# Patient Record
Sex: Female | Born: 1990 | Race: White | Hispanic: No | Marital: Married | Smoking: Current every day smoker
Health system: Southern US, Community
[De-identification: ages and names within clinical notes are randomized; demographics above are authoritative.]

## PROBLEM LIST (undated history)

## (undated) DIAGNOSIS — R87619 Unspecified abnormal cytological findings in specimens from cervix uteri: Secondary | ICD-10-CM

## (undated) DIAGNOSIS — F329 Major depressive disorder, single episode, unspecified: Secondary | ICD-10-CM

## (undated) DIAGNOSIS — F429 Obsessive-compulsive disorder, unspecified: Secondary | ICD-10-CM

## (undated) DIAGNOSIS — J45909 Unspecified asthma, uncomplicated: Secondary | ICD-10-CM

## (undated) DIAGNOSIS — R87629 Unspecified abnormal cytological findings in specimens from vagina: Secondary | ICD-10-CM

## (undated) DIAGNOSIS — K589 Irritable bowel syndrome without diarrhea: Secondary | ICD-10-CM

## (undated) DIAGNOSIS — B009 Herpesviral infection, unspecified: Secondary | ICD-10-CM

## (undated) DIAGNOSIS — N3010 Interstitial cystitis (chronic) without hematuria: Secondary | ICD-10-CM

## (undated) DIAGNOSIS — J069 Acute upper respiratory infection, unspecified: Secondary | ICD-10-CM

## (undated) DIAGNOSIS — K219 Gastro-esophageal reflux disease without esophagitis: Secondary | ICD-10-CM

## (undated) DIAGNOSIS — Z3490 Encounter for supervision of normal pregnancy, unspecified, unspecified trimester: Secondary | ICD-10-CM

## (undated) DIAGNOSIS — IMO0001 Reserved for inherently not codable concepts without codable children: Secondary | ICD-10-CM

## (undated) DIAGNOSIS — G2581 Restless legs syndrome: Secondary | ICD-10-CM

## (undated) DIAGNOSIS — F419 Anxiety disorder, unspecified: Secondary | ICD-10-CM

## (undated) DIAGNOSIS — IMO0002 Reserved for concepts with insufficient information to code with codable children: Secondary | ICD-10-CM

## (undated) DIAGNOSIS — Z8619 Personal history of other infectious and parasitic diseases: Secondary | ICD-10-CM

## (undated) HISTORY — DX: Restless legs syndrome: G25.81

## (undated) HISTORY — DX: Anxiety disorder, unspecified: F41.9

## (undated) HISTORY — DX: Major depressive disorder, single episode, unspecified: F32.9

## (undated) HISTORY — DX: Personal history of other infectious and parasitic diseases: Z86.19

## (undated) HISTORY — DX: Unspecified asthma, uncomplicated: J45.909

## (undated) HISTORY — DX: Unspecified abnormal cytological findings in specimens from cervix uteri: R87.619

## (undated) HISTORY — DX: Acute upper respiratory infection, unspecified: J06.9

## (undated) HISTORY — DX: Reserved for inherently not codable concepts without codable children: IMO0001

## (undated) HISTORY — DX: Herpesviral infection, unspecified: B00.9

## (undated) HISTORY — DX: Gastro-esophageal reflux disease without esophagitis: K21.9

## (undated) HISTORY — DX: Interstitial cystitis (chronic) without hematuria: N30.10

## (undated) HISTORY — DX: Reserved for concepts with insufficient information to code with codable children: IMO0002

## (undated) HISTORY — DX: Irritable bowel syndrome, unspecified: K58.9

## (undated) HISTORY — PX: NO PAST SURGERIES: SHX2092

## (undated) HISTORY — DX: Obsessive-compulsive disorder, unspecified: F42.9

## (undated) HISTORY — DX: Unspecified abnormal cytological findings in specimens from vagina: R87.629

---
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
## 2009-04-13 ENCOUNTER — Ambulatory Visit (HOSPITAL_COMMUNITY): Admission: RE | Admit: 2009-04-13 | Discharge: 2009-04-13 | Payer: Self-pay | Admitting: Family Medicine

## 2010-05-22 ENCOUNTER — Ambulatory Visit (HOSPITAL_COMMUNITY)
Admission: RE | Admit: 2010-05-22 | Discharge: 2010-05-22 | Payer: Self-pay | Source: Home / Self Care | Admitting: Family Medicine

## 2010-06-13 ENCOUNTER — Ambulatory Visit: Payer: Self-pay | Admitting: Internal Medicine

## 2010-06-19 ENCOUNTER — Encounter (HOSPITAL_COMMUNITY)
Admission: RE | Admit: 2010-06-19 | Discharge: 2010-07-19 | Payer: Self-pay | Source: Home / Self Care | Attending: Internal Medicine | Admitting: Internal Medicine

## 2010-07-04 ENCOUNTER — Ambulatory Visit
Admission: RE | Admit: 2010-07-04 | Discharge: 2010-07-04 | Payer: Self-pay | Source: Home / Self Care | Attending: Internal Medicine | Admitting: Internal Medicine

## 2010-07-25 ENCOUNTER — Ambulatory Visit (HOSPITAL_COMMUNITY)
Admission: RE | Admit: 2010-07-25 | Discharge: 2010-07-25 | Payer: Self-pay | Source: Home / Self Care | Attending: Internal Medicine | Admitting: Internal Medicine

## 2010-07-25 ENCOUNTER — Ambulatory Visit: Admit: 2010-07-25 | Payer: Self-pay | Admitting: Internal Medicine

## 2010-07-27 ENCOUNTER — Ambulatory Visit (HOSPITAL_COMMUNITY)
Admission: RE | Admit: 2010-07-27 | Discharge: 2010-07-27 | Payer: Self-pay | Source: Home / Self Care | Attending: Internal Medicine | Admitting: Internal Medicine

## 2010-08-04 NOTE — Consult Note (Signed)
NAMEJENNESSA, Tracy Flores               ACCOUNT NO.:  [id number]  MEDICAL RECORD NO.:  [id number]          Flores TYPE:  AMB  LOCATION:  DAY                           FACILITY:  [facility]  PHYSICIAN:  Lionel December, M.D.    DATE OF BIRTH:  [birth year]  DATE OF CONSULTATION:  07/04/2010 DATE OF DISCHARGE:                                CONSULTATION   REASON FOR CONSULT:  Epigastric pain and nausea.  Tracy Flores is a 20 year old female that presents today with complaints of nausea, vomiting and diarrhea.  She states there were chills also associated with her complaints.  She was last seen June 13, 2010, for epigastric pain, cramping and mucus in her stools.  She underwent a HIDA scan which was normal.  She is H. pylori negative.  She has celiac panel May 09, 2010, which was negative.  On May 09, 2010, her basic metabolic panel was normal except for glucose was slightly elevated at 101.  Her liver profile, her total bilirubin was 0.4, direct 0.1, indirect was 0.3, ALP was 70, AST 13, ALT 9, total protein was 7.1. Her albumin was 4.8 and her amylase was 18.  Abdominal ultrasound was negative.  Her common bile duct measured 2.8-mm.  Since her last visit, she has lost approximately 4 pounds.  She complains of upper epigastric pain on a daily basis.  She states when she eats she will have epigastric pain.  She is presently taking clonazepam one half in Tracy morning for her diarrhea which is better.  She is having 2 stools a day at this time.  She states her symptoms are worse since her last visit.  Home medications include: 1. Protonix 40 mg a day. 2. Lomotil p.r.n. 3. Clonazepam one half tab a day.  OBJECTIVE:  VITAL SIGNS:  Temperature is 97.8, blood pressure is 110/70, pulse is 72, her weight is 146.3.  Please note, she is allergic to PENICILLIN intake for Tracy IVs. MOUTH:  She has natural teeth.  Her oral mucosa is moist. EYES:  Her conjunctivae are pink.  Her sclerae are  anicteric. NECK:  Her thyroid is normal.  There is no cervical lymphadenopathy. LUNGS:  Clear. HEART:  Regular rate and rhythm. ABDOMEN:  Soft.  Bowel sounds are present.  She does have epigastric tenderness. EXTREMITIES:  There is no edema to his lower extremities.  ASSESSMENT:  Tracy Flores is a 20 year old female with complaints of Tracy gastric pain, nausea and vomiting.  Her diarrhea has improved. She is having two stools  a day.  She is on Protonix which has not helped.  She is Helicobacter pylori negative.  Peptic ulcer disease needs to be ruled out.  RECOMMENDATIONS:  We will plan for an EGD with Dr. Karilyn Cota in Tracy near future, and I have discussed Tracy risks and benefits with Tracy Flores and her grandmother who was also a Flores of Dr. Karilyn Cota.    ______________________________ Dorene Ar, NP   ______________________________ Lionel December, M.D.    TS/MEDQ  D:  07/04/2010  T:  07/05/2010  Job:  [id number]  Electronically Signed by Dorene Ar PA on 07/24/2010 05:26:16  PM Electronically Signed by Lionel December M.D. on 08/04/2010 01:42:43 PM

## 2010-08-04 NOTE — Op Note (Signed)
  Tracy Flores, SCHONBERGER               ACCOUNT NO.:  [id number]  MEDICAL RECORD NO.:  [id number]          PATIENT TYPE:  AMB  LOCATION:  DAY                           FACILITY:  [facility]  PHYSICIAN:  Lionel December, M.D.    DATE OF BIRTH:  1990/12/10  DATE OF PROCEDURE:  07/25/2010 DATE OF DISCHARGE:                              OPERATIVE REPORT   PROCEDURE:  Esophagogastroduodenoscopy.  INDICATIONS:  [name] is a 20 year old Caucasian female who has had symptoms of GERD for 2 years and maintained on a PPI.  She states her heartburn has generally been well controlled, but for last few weeks she has been experiencing epigastric pain and nausea.  She had negative hepatobiliary ultrasound in November and in December 2011.  She had HIDA scan with [facility] which was within normal limits.  Her EF was 70%.  She also complains of recurrent diarrhea.  She states in a given month she may have 3 weeks of diarrhea and rest of the days stools maybe normal.  She is undergoing diagnostic EGD.  MEDICATIONS FOR CONSCIOUS SEDATION: 1. Cetacaine spray for oropharyngeal topical anesthesia. 2. Demerol 50 mg IV. 3. Versed 13 mg IV.  FINDINGS:  Procedure performed in endoscopy suite.  The patient's vital signs and O2 sats were monitored during the procedure and remained stable.  The patient was placed in left lateral recumbent position and Pentax videoscope was passed through oropharynx without any difficulty into esophagus.  Esophagus, mucosa of the esophagus normal.  GE junction was located at 38-cm from the incisors and with a serrated appearance and there was slight discoloration of the mucosa.  Therefore, biopsy was taken on the way out to rule out short segment Barrett's.  GE junction was located at 38 and hiatus was at 40.  Stomach, it was empty and distended very well with insufflation.  Folds of the proximal stomach are normal.  Examination of mucosa at body, antrum, pyloric channel as well as  angularis, fundus and cardia was normal.  Duodenum, bulbar mucosa was normal.  Scope was passed into the second part of the duodenum where mucosa had some coarse appearance. Therefore, biopsy was taken to rule out mucosal disease.  Endoscope was withdrawn.  The patient tolerated the procedure well.  FINAL DIAGNOSES: 1. Small sliding hiatal hernia and serrated GEJ.    Biopsy was taken to rule out short segment Barrett's. 2. Abnormal appearance to postbulbar mucosa.  It was biopsied to rule     out celiac disease.  RECOMMENDATIONS: 1. She will continue antireflux measures and pantoprazole as before. 2. Dicyclomine 10 mg before breakfast and lunch prescription given for     60 with 3 refills.  We will schedule her for small-bowel follow-     through.  I will be contacting the patient and her mother with     results of pending studies.     Lionel December, M.D.     NR/MEDQ  D:  07/25/2010  T:  07/26/2010  Job:  [id number]  cc:   Lorin Picket A. Gerda Diss, MD Fax: [phone]  Electronically Signed by Lionel December M.D. on 08/04/2010 01:43:36 PM

## 2010-09-12 ENCOUNTER — Emergency Department (HOSPITAL_COMMUNITY)
Admission: EM | Admit: 2010-09-12 | Discharge: 2010-09-13 | Disposition: A | Discharge status: Other Acute Inpatient Hospital | Payer: BC Managed Care – PPO | Attending: Emergency Medicine | Admitting: Emergency Medicine

## 2010-09-12 DIAGNOSIS — F411 Generalized anxiety disorder: Secondary | ICD-10-CM | POA: Insufficient documentation

## 2010-09-12 DIAGNOSIS — R45851 Suicidal ideations: Secondary | ICD-10-CM | POA: Insufficient documentation

## 2010-09-12 DIAGNOSIS — F3289 Other specified depressive episodes: Secondary | ICD-10-CM | POA: Insufficient documentation

## 2010-09-12 DIAGNOSIS — F410 Panic disorder [episodic paroxysmal anxiety] without agoraphobia: Secondary | ICD-10-CM | POA: Insufficient documentation

## 2010-09-12 DIAGNOSIS — F329 Major depressive disorder, single episode, unspecified: Secondary | ICD-10-CM | POA: Insufficient documentation

## 2010-09-12 LAB — POCT PREGNANCY, URINE: Preg Test, Ur: NEGATIVE

## 2010-09-13 LAB — RAPID URINE DRUG SCREEN, HOSP PERFORMED
Amphetamines: NOT DETECTED
Benzodiazepines: NOT DETECTED
Cocaine: NOT DETECTED

## 2010-09-13 LAB — DIFFERENTIAL
Basophils Absolute: 0 10*3/uL (ref 0.0–0.1)
Basophils Relative: 0 % (ref 0–1)
Eosinophils Absolute: 0.2 10*3/uL (ref 0.0–0.7)
Eosinophils Relative: 2 % (ref 0–5)
Lymphocytes Relative: 38 % (ref 12–46)
Lymphs Abs: 3.5 10*3/uL (ref 0.7–4.0)
Monocytes Absolute: 0.8 10*3/uL (ref 0.1–1.0)
Monocytes Relative: 9 % (ref 3–12)
Neutro Abs: 4.7 10*3/uL (ref 1.7–7.7)
Neutrophils Relative %: 52 % (ref 43–77)

## 2010-09-13 LAB — CBC
HCT: 37.2 % (ref 36.0–46.0)
Hemoglobin: 12.8 g/dL (ref 12.0–15.0)
MCH: 29.4 pg (ref 26.0–34.0)
MCHC: 34.4 g/dL (ref 30.0–36.0)
MCV: 85.5 fL (ref 78.0–100.0)
Platelets: 209 10*3/uL (ref 150–400)
RBC: 4.35 MIL/uL (ref 3.87–5.11)
RDW: 12.8 % (ref 11.5–15.5)
WBC: 9.2 10*3/uL (ref 4.0–10.5)

## 2010-09-13 LAB — BASIC METABOLIC PANEL WITH GFR
BUN: 12 mg/dL (ref 6–23)
CO2: 26 meq/L (ref 19–32)
Calcium: 9.2 mg/dL (ref 8.4–10.5)
Chloride: 105 meq/L (ref 96–112)
Creatinine, Ser: 0.77 mg/dL (ref 0.4–1.2)
GFR calc non Af Amer: >60 mL/min
Glucose, Bld: 105 mg/dL — ABNORMAL HIGH (ref 70–99)
Potassium: 3.3 meq/L — ABNORMAL LOW (ref 3.5–5.1)
Sodium: 138 meq/L (ref 135–145)

## 2010-09-13 LAB — ETHANOL: Alcohol, Ethyl (B): <5 mg/dL (ref 0–10)

## 2010-10-29 ENCOUNTER — Emergency Department (HOSPITAL_COMMUNITY)
Admission: EM | Admit: 2010-10-29 | Discharge: 2010-10-30 | Disposition: A | Discharge status: Other Acute Inpatient Hospital | Payer: BC Managed Care – PPO | Attending: Emergency Medicine | Admitting: Emergency Medicine

## 2010-10-29 DIAGNOSIS — F411 Generalized anxiety disorder: Secondary | ICD-10-CM | POA: Insufficient documentation

## 2010-10-29 DIAGNOSIS — F3130 Bipolar disorder, current episode depressed, mild or moderate severity, unspecified: Secondary | ICD-10-CM | POA: Insufficient documentation

## 2010-10-29 DIAGNOSIS — R45851 Suicidal ideations: Secondary | ICD-10-CM | POA: Insufficient documentation

## 2010-10-29 LAB — ACETAMINOPHEN LEVEL: Acetaminophen (Tylenol), Serum: <10 ug/mL — ABNORMAL LOW (ref 10–30)

## 2010-10-29 LAB — BASIC METABOLIC PANEL
BUN: 11 mg/dL (ref 6–23)
Calcium: 9.2 mg/dL (ref 8.4–10.5)
Chloride: 107 mEq/L (ref 96–112)
Creatinine, Ser: 0.91 mg/dL (ref 0.4–1.2)
GFR calc Af Amer: >60 mL/min (ref >60)
GFR calc non Af Amer: >60 mL/min (ref >60)

## 2010-10-29 LAB — URINE MICROSCOPIC-ADD ON

## 2010-10-29 LAB — DIFFERENTIAL
Basophils Absolute: 0 10*3/uL (ref 0.0–0.1)
Basophils Relative: 0 % (ref 0–1)
Eosinophils Absolute: 0.2 10*3/uL (ref 0.0–0.7)
Monocytes Absolute: 0.7 10*3/uL (ref 0.1–1.0)
Monocytes Relative: 8 % (ref 3–12)
Neutro Abs: 3.8 10*3/uL (ref 1.7–7.7)
Neutrophils Relative %: 43 % (ref 43–77)

## 2010-10-29 LAB — URINALYSIS, ROUTINE W REFLEX MICROSCOPIC
Bilirubin Urine: NEGATIVE
Leukocytes, UA: NEGATIVE
Nitrite: NEGATIVE
Specific Gravity, Urine: <1.005 — ABNORMAL LOW (ref 1.005–1.030)
Urobilinogen, UA: 0.2 mg/dL (ref 0.0–1.0)
pH: 6.5 (ref 5.0–8.0)

## 2010-10-29 LAB — PREGNANCY, URINE: Preg Test, Ur: NEGATIVE

## 2010-10-29 LAB — CBC
Hemoglobin: 13.3 g/dL (ref 12.0–15.0)
MCH: 30.1 pg (ref 26.0–34.0)
MCHC: 33.6 g/dL (ref 30.0–36.0)
Platelets: 257 10*3/uL (ref 150–400)

## 2010-10-29 LAB — RAPID URINE DRUG SCREEN, HOSP PERFORMED: Tetrahydrocannabinol: POSITIVE — AB

## 2010-10-29 LAB — ETHANOL: Alcohol, Ethyl (B): <5 mg/dL (ref 0–10)

## 2010-10-29 LAB — SALICYLATE LEVEL: Salicylate Lvl: <4 mg/dL (ref 2.8–20.0)

## 2010-10-30 ENCOUNTER — Inpatient Hospital Stay (HOSPITAL_COMMUNITY)
Admission: AD | Admit: 2010-10-30 | Discharge: 2010-10-31 | DRG: 430 | Disposition: A | Discharge status: Home / Self Care | Payer: BC Managed Care – PPO | Source: Ambulatory Visit | Attending: Psychiatry | Admitting: Psychiatry

## 2010-10-30 DIAGNOSIS — F339 Major depressive disorder, recurrent, unspecified: Secondary | ICD-10-CM

## 2010-10-30 DIAGNOSIS — K589 Irritable bowel syndrome without diarrhea: Secondary | ICD-10-CM

## 2010-10-30 DIAGNOSIS — F332 Major depressive disorder, recurrent severe without psychotic features: Principal | ICD-10-CM

## 2010-10-30 DIAGNOSIS — Z880 Allergy status to penicillin: Secondary | ICD-10-CM

## 2010-10-30 DIAGNOSIS — R45851 Suicidal ideations: Secondary | ICD-10-CM
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
## 2010-11-24 ENCOUNTER — Emergency Department (HOSPITAL_COMMUNITY)
Admission: EM | Admit: 2010-11-24 | Discharge: 2010-11-24 | Disposition: A | Discharge status: Home / Self Care | Payer: BC Managed Care – PPO | Attending: Emergency Medicine | Admitting: Emergency Medicine

## 2010-11-24 ENCOUNTER — Emergency Department (HOSPITAL_COMMUNITY): Payer: BC Managed Care – PPO

## 2010-11-24 DIAGNOSIS — K589 Irritable bowel syndrome without diarrhea: Secondary | ICD-10-CM | POA: Insufficient documentation

## 2010-11-24 DIAGNOSIS — S82899A Other fracture of unspecified lower leg, initial encounter for closed fracture: Secondary | ICD-10-CM | POA: Insufficient documentation

## 2010-11-24 DIAGNOSIS — F319 Bipolar disorder, unspecified: Secondary | ICD-10-CM | POA: Insufficient documentation

## 2010-11-24 DIAGNOSIS — W19XXXA Unspecified fall, initial encounter: Secondary | ICD-10-CM | POA: Insufficient documentation

## 2010-11-24 DIAGNOSIS — Z79899 Other long term (current) drug therapy: Secondary | ICD-10-CM | POA: Insufficient documentation

## 2010-11-24 DIAGNOSIS — F411 Generalized anxiety disorder: Secondary | ICD-10-CM | POA: Insufficient documentation

## 2010-11-27 ENCOUNTER — Ambulatory Visit (INDEPENDENT_AMBULATORY_CARE_PROVIDER_SITE_OTHER): Payer: BC Managed Care – PPO | Admitting: Orthopedic Surgery

## 2010-11-27 ENCOUNTER — Encounter: Payer: Self-pay | Admitting: Orthopedic Surgery

## 2010-11-27 VITALS — HR 70 | Ht 69.0 in | Wt 140.0 lb

## 2010-11-27 DIAGNOSIS — S82899A Other fracture of unspecified lower leg, initial encounter for closed fracture: Secondary | ICD-10-CM | POA: Insufficient documentation

## 2010-11-27 DIAGNOSIS — S8263XA Displaced fracture of lateral malleolus of unspecified fibula, initial encounter for closed fracture: Secondary | ICD-10-CM

## 2010-11-27 MED ORDER — HYDROCODONE-ACETAMINOPHEN 7.5-325 MG PO TABS: 1.0000 | ORAL_TABLET | ORAL | Status: DC | PRN

## 2010-11-27 NOTE — Patient Instructions (Signed)
Elevate, apply ice 6 times a day for 30 minutes.  No weightbearing.  Come back in a week for cast

## 2010-11-27 NOTE — Progress Notes (Signed)
The patient  RIGHT ankle pain.  Date of injury May 25  Fell downstairs  Severe pain, RIGHT ankle, swelling, popping, sharp.  Exam severely swollen foot. Skin neck and mycotic. Could not assess range of motion, stability, or strength. Muscle tone is normal. Ankle is fairly swollen, tender to palpation.  End plates with crutches.  Mood and affect. Normal.  Oriented x3.  General appearance normal.  3. Vital signs obtained. The patient cannot stand for actual weight.  Pulse and temperature normal. Edema from swelling.  Lymph nodes negative.  Sensation normal.  Reflexes deferred.  Coordination and balance normal.  X-rays show nondisplaced Weber B. Fracture, fibula.  Patient to swollen to cast, try again next week after splinting again continue nonweightbearing

## 2010-12-04 ENCOUNTER — Ambulatory Visit (INDEPENDENT_AMBULATORY_CARE_PROVIDER_SITE_OTHER): Payer: BC Managed Care – PPO | Admitting: Orthopedic Surgery

## 2010-12-04 ENCOUNTER — Encounter: Payer: Self-pay | Admitting: Orthopedic Surgery

## 2010-12-04 DIAGNOSIS — S8263XA Displaced fracture of lateral malleolus of unspecified fibula, initial encounter for closed fracture: Secondary | ICD-10-CM

## 2010-12-04 NOTE — Progress Notes (Signed)
RIGHT ankle fracture, lateral malleolus, nondisplaced, postinjury date 10  Application short leg cast.  A large portion. The swelling has gone down. The skin is ecchymotic and discolored. She has pins and needles in the foot as well.  No contraindication to casting. She is placed in a weightbearing cast and advised to come back in 2 weeks for x-rays out of plaster. Possible air cast at that time

## 2010-12-04 NOTE — Patient Instructions (Signed)
Weight bearing as tolerated   Start with crutches then wean off crutches   Keep  Cast dry   Do not get wet   If it gets wet dry with a hair dryer on low setting and call the office

## 2010-12-16 ENCOUNTER — Emergency Department (HOSPITAL_COMMUNITY)
Admission: EM | Admit: 2010-12-16 | Discharge: 2010-12-16 | Disposition: A | Discharge status: Home / Self Care | Payer: BC Managed Care – PPO | Attending: Emergency Medicine | Admitting: Emergency Medicine

## 2010-12-16 DIAGNOSIS — F1910 Other psychoactive substance abuse, uncomplicated: Secondary | ICD-10-CM | POA: Insufficient documentation

## 2010-12-16 DIAGNOSIS — N390 Urinary tract infection, site not specified: Secondary | ICD-10-CM | POA: Insufficient documentation

## 2010-12-16 DIAGNOSIS — F319 Bipolar disorder, unspecified: Secondary | ICD-10-CM | POA: Insufficient documentation

## 2010-12-16 DIAGNOSIS — F17200 Nicotine dependence, unspecified, uncomplicated: Secondary | ICD-10-CM | POA: Insufficient documentation

## 2010-12-16 DIAGNOSIS — K589 Irritable bowel syndrome without diarrhea: Secondary | ICD-10-CM | POA: Insufficient documentation

## 2010-12-16 LAB — URINALYSIS, ROUTINE W REFLEX MICROSCOPIC
Glucose, UA: NEGATIVE mg/dL
Ketones, ur: NEGATIVE mg/dL
Protein, ur: NEGATIVE mg/dL
Urobilinogen, UA: 0.2 mg/dL (ref 0.0–1.0)

## 2010-12-16 LAB — URINE MICROSCOPIC-ADD ON

## 2010-12-18 ENCOUNTER — Ambulatory Visit: Payer: BC Managed Care – PPO | Admitting: Orthopedic Surgery

## 2010-12-19 ENCOUNTER — Ambulatory Visit (INDEPENDENT_AMBULATORY_CARE_PROVIDER_SITE_OTHER): Payer: BC Managed Care – PPO | Admitting: Orthopedic Surgery

## 2010-12-19 ENCOUNTER — Encounter: Payer: Self-pay | Admitting: Orthopedic Surgery

## 2010-12-19 DIAGNOSIS — S82899A Other fracture of unspecified lower leg, initial encounter for closed fracture: Secondary | ICD-10-CM

## 2010-12-19 NOTE — Progress Notes (Signed)
RIGHT ankle fracture, lateral malleolus, nondisplaced,  Date of injury was May 25  Applied. Short leg cast, June 5.  Patient says she took the cast off, approximately 5 days ago.   Scheduled for x-rays today.   Post injury day #26  Clarification. She was walking by a pool slipped and fell onto the water with her cast on, so she took it off. It's been off her for 5 days.  Her exam shows tenderness at the fracture site. Her range of motion at the ankle joint has improved significantly.  X-rays show persistent fracture, so we will put her in an Aircast a repair as tolerated. She can return to work.  Come back a week's x-ray,  Wear  Aircast for ambulation

## 2010-12-19 NOTE — Patient Instructions (Signed)
Wear air cast when walking

## 2011-01-17 ENCOUNTER — Encounter (HOSPITAL_COMMUNITY): Payer: Self-pay | Admitting: Emergency Medicine

## 2011-01-17 ENCOUNTER — Emergency Department (HOSPITAL_COMMUNITY)
Admission: EM | Admit: 2011-01-17 | Discharge: 2011-01-18 | Disposition: A | Discharge status: Other Acute Inpatient Hospital | Payer: BC Managed Care – PPO | Attending: Emergency Medicine | Admitting: Emergency Medicine

## 2011-01-17 DIAGNOSIS — F329 Major depressive disorder, single episode, unspecified: Secondary | ICD-10-CM

## 2011-01-17 DIAGNOSIS — F3289 Other specified depressive episodes: Secondary | ICD-10-CM | POA: Insufficient documentation

## 2011-01-17 DIAGNOSIS — F17200 Nicotine dependence, unspecified, uncomplicated: Secondary | ICD-10-CM | POA: Insufficient documentation

## 2011-01-17 DIAGNOSIS — K589 Irritable bowel syndrome without diarrhea: Secondary | ICD-10-CM | POA: Insufficient documentation

## 2011-01-17 DIAGNOSIS — K219 Gastro-esophageal reflux disease without esophagitis: Secondary | ICD-10-CM | POA: Insufficient documentation

## 2011-01-17 LAB — COMPREHENSIVE METABOLIC PANEL
ALT: 7 U/L (ref 0–35)
AST: 12 U/L (ref 0–37)
Alkaline Phosphatase: 73 U/L (ref 39–117)
CO2: 30 mEq/L (ref 19–32)
Chloride: 104 mEq/L (ref 96–112)
GFR calc non Af Amer: >60 mL/min (ref >60)
Sodium: 140 mEq/L (ref 135–145)
Total Bilirubin: 0.2 mg/dL — ABNORMAL LOW (ref 0.3–1.2)

## 2011-01-17 LAB — CBC
Platelets: 221 10*3/uL (ref 150–400)
RBC: 4.41 MIL/uL (ref 3.87–5.11)
WBC: 9.3 10*3/uL (ref 4.0–10.5)

## 2011-01-17 LAB — RAPID URINE DRUG SCREEN, HOSP PERFORMED
Barbiturates: NOT DETECTED
Tetrahydrocannabinol: POSITIVE — AB

## 2011-01-17 LAB — POCT PREGNANCY, URINE: Preg Test, Ur: NEGATIVE

## 2011-01-17 NOTE — ED Notes (Signed)
Pt talking on cell phone, becoming upset, cell phone removed, offered pt something to eat, pt refused, pt's grandmother called er and advised that we would need to feed pt, explained to grandmother that pt was refusing to eat but that we would keep offering pt food, sitter at bedside,

## 2011-01-17 NOTE — ED Provider Notes (Signed)
History     Chief Complaint  Patient presents with  . Medical Clearance   Patient is a 20 y.o. female presenting with mental health disorder. The history is provided by the patient. No language interpreter was used.  Mental Health Problem Primary symptoms comment: Depression Episode onset: several days ago. This is a recurrent problem.  Additional symptoms of the illness do not include no headaches or no abdominal pain. Associated symptoms comments: "Racing thoughts.". She admits to suicidal ideas. She does not have a plan to commit suicide. She has not already injured self. She does not contemplate injuring another person. Risk factors that are present for mental illness include a history of mental illness.  Patient c/o severe depression and racing thoughts onset several days ago and worsening since. Reports having suicidal ideations today but has no plan currently. States depression and SI are not relieved or aggravated by anything. Patient was seen at [facility] today and referred to ED for evaluation after expressing suicidal ideations. Reports history of SI but no attempts. Denies etoh/drug use and homicidal ideations.  Patient seen at 8:19PM Past Medical History  Diagnosis Date  . IBS (irritable bowel syndrome)   . Acid reflux disease   . Major depression   . OCD (obsessive compulsive disorder)   . Anxiety   . Interstitial cystitis     History reviewed. No pertinent past surgical history.  Family History  Problem Relation Age of Onset  . Heart disease    . Arthritis    . Cancer    . Diabetes      History  Substance Use Topics  . Smoking status: Current Everyday Smoker -- 1.0 packs/day    Types: Cigarettes  . Smokeless tobacco: Not on file  . Alcohol Use: Yes     very little not often    OB History    Grav Para Term Preterm Abortions TAB SAB Ect Mult Living                  Review of Systems  Gastrointestinal: Negative for abdominal pain.  Neurological: Negative  for headaches.  Psychiatric/Behavioral: Positive for suicidal ideas.       Depression. Racing thoughts.   All other systems reviewed and are negative.  All other systems negative except as noted in HPI.    Physical Exam  BP 120/75  Pulse 54  Temp(Src) 98 F (36.7 C) (Oral)  Resp 20  Ht 5\' 7"  (1.702 m)  Wt 133 lb 6.4 oz (60.51 kg)  BMI 20.89 kg/m2  SpO2 100%  LMP 12/27/2010  Physical Exam  Nursing note and vitals reviewed. Constitutional: She is oriented to person, place, and time. She appears well-developed and well-nourished. No distress.       Bradycardic.   HENT:  Head: Normocephalic and atraumatic.  Eyes: Conjunctivae are normal. No scleral icterus.  Neck: Normal range of motion. Neck supple.  Cardiovascular: Normal rate, regular rhythm and normal pulses.   Pulmonary/Chest: Effort normal.  Abdominal: Soft. Bowel sounds are normal. She exhibits no distension. There is no tenderness.  Musculoskeletal: Normal range of motion. She exhibits no edema.  Lymphadenopathy:    She has no cervical adenopathy.  Neurological: She is alert and oriented to person, place, and time. No sensory deficit.  Skin: Skin is warm and dry.  Psychiatric: She has a normal mood and affect. Her behavior is normal. Thought content normal.    ED Course  Procedures  MDM      Results for  orders placed during the hospital encounter of 01/17/11  CBC      Component Value Range   WBC 9.3  4.0 - 10.5 (K/uL)   RBC 4.41  3.87 - 5.11 (MIL/uL)   Hemoglobin 13.5  12.0 - 15.0 (g/dL)   HCT 30.1  60.1 - 09.3 (%)   MCV 90.9  78.0 - 100.0 (fL)   MCH 30.6  26.0 - 34.0 (pg)   MCHC 33.7  30.0 - 36.0 (g/dL)   RDW 23.5  57.3 - 22.0 (%)   Platelets 221  150 - 400 (K/uL)  COMPREHENSIVE METABOLIC PANEL      Component Value Range   Sodium 140  135 - 145 (mEq/L)   Potassium 3.9  3.5 - 5.1 (mEq/L)   Chloride 104  96 - 112 (mEq/L)   CO2 30  19 - 32 (mEq/L)   Glucose, Bld 93  70 - 99 (mg/dL)   BUN 10  6 - 23  (mg/dL)   Creatinine, Ser 2.54  0.50 - 1.10 (mg/dL)   Calcium 9.3  8.4 - 27.0 (mg/dL)   Total Protein 7.1  6.0 - 8.3 (g/dL)   Albumin 3.8  3.5 - 5.2 (g/dL)   AST 12  0 - 37 (U/L)   ALT 7  0 - 35 (U/L)   Alkaline Phosphatase 73  39 - 117 (U/L)   Total Bilirubin 0.2 (*) 0.3 - 1.2 (mg/dL)   GFR calc non Af Amer >60  >60 (mL/min)   GFR calc Af Amer >60  >60 (mL/min)  ETHANOL      Component Value Range   Alcohol, Ethyl (B) <11  0 - 11 (mg/dL)  DRUG SCREEN PANEL, EMERGENCY      Component Value Range   Opiates NONE DETECTED  NONE DETECTED    Cocaine POSITIVE (*) NONE DETECTED    Benzodiazepines NONE DETECTED  NONE DETECTED    Amphetamines NONE DETECTED  NONE DETECTED    Tetrahydrocannabinol POSITIVE (*) NONE DETECTED    Barbiturates NONE DETECTED  NONE DETECTED   PREGNANCY, URINE      Component Value Range   Preg Test, Ur NEGATIVE    POCT PREGNANCY, URINE      Component Value Range   Preg Test, Ur NEGATIVE       Chart written by Clarita Crane acting as scribe for Nelia Shi, MD  I personally performed the services described in this documentation, which was scribed in my presence. The recorded information has been reviewed and considered.   Nelia Shi, MD 01/27/11 [phone]

## 2011-01-17 NOTE — ED Notes (Signed)
Food and nicotine patch offered to pt,. Pt refusing each, advised to let staff know if she changes her mind

## 2011-01-17 NOTE — ED Notes (Signed)
Samson Frederic, ([facility]) here to evaluate pt

## 2011-01-17 NOTE — ED Notes (Signed)
Pt was seen at [facility] and sent here for involuntary commitment. Pt states she has been si for about 6 months. Pt states she does not have a plan.

## 2011-01-17 NOTE — ED Notes (Signed)
Pt in hallway 4 with sitter. NAD> pt placed in blue scrubs.

## 2011-01-17 NOTE — ED Notes (Addendum)
Pt brought to er from [facility] with ivc paperwork, pt states that she has been depressed, admits to SI, states that she doesn't have a plan, pt states that she has been having racing thoughts and she gets overwhelmed when she has these thoughts, pt cooperative at present, little eye contact, ivc paperwork placed on pt's chart, sitter at bedside, pt in paper scrubs,

## 2011-01-18 ENCOUNTER — Inpatient Hospital Stay (HOSPITAL_COMMUNITY)
Admission: RE | Admit: 2011-01-18 | Discharge: 2011-01-19 | DRG: 430 | Disposition: A | Discharge status: Home / Self Care | Payer: BC Managed Care – PPO | Source: Ambulatory Visit | Attending: Psychiatry | Admitting: Psychiatry

## 2011-01-18 DIAGNOSIS — R45851 Suicidal ideations: Secondary | ICD-10-CM

## 2011-01-18 DIAGNOSIS — IMO0002 Reserved for concepts with insufficient information to code with codable children: Secondary | ICD-10-CM

## 2011-01-18 DIAGNOSIS — F339 Major depressive disorder, recurrent, unspecified: Secondary | ICD-10-CM

## 2011-01-18 DIAGNOSIS — F332 Major depressive disorder, recurrent severe without psychotic features: Principal | ICD-10-CM

## 2011-01-18 DIAGNOSIS — F411 Generalized anxiety disorder: Secondary | ICD-10-CM

## 2011-01-18 DIAGNOSIS — Z818 Family history of other mental and behavioral disorders: Secondary | ICD-10-CM

## 2011-01-18 DIAGNOSIS — F4310 Post-traumatic stress disorder, unspecified: Secondary | ICD-10-CM

## 2011-01-18 MED ORDER — LORAZEPAM 1 MG PO TABS
0.5000 mg | ORAL_TABLET | Freq: Once | ORAL | Status: DC
  Filled 2011-01-18: qty 1

## 2011-01-18 NOTE — ED Notes (Signed)
Patient resting does not need anything at this time.

## 2011-01-18 NOTE — ED Notes (Addendum)
Pt ambulatory to restroom, sitter at bedside, pt has been accepted to [facility] by Dr. Darlina Guys, bed number 503-1

## 2011-01-18 NOTE — Progress Notes (Signed)
Advised by nursing that patient has been accepted at [facility] by Dr. Harvie Heck Readling.

## 2011-01-18 NOTE — ED Notes (Signed)
Report given to Susa Simmonds, RN at [facility], [facility] here to transport pt. Pt belonging given to [employer], belonging bag is tied, stamped with pt name

## 2011-01-18 NOTE — ED Notes (Signed)
Pt refused ativan,
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
## 2011-02-12 ENCOUNTER — Ambulatory Visit: Payer: BC Managed Care – PPO | Admitting: Orthopedic Surgery

## 2011-02-12 ENCOUNTER — Encounter: Payer: Self-pay | Admitting: Orthopedic Surgery

## 2011-03-13 ENCOUNTER — Ambulatory Visit (INDEPENDENT_AMBULATORY_CARE_PROVIDER_SITE_OTHER): Payer: BC Managed Care – PPO | Admitting: Psychiatry

## 2011-03-13 DIAGNOSIS — F332 Major depressive disorder, recurrent severe without psychotic features: Secondary | ICD-10-CM

## 2011-03-13 DIAGNOSIS — F411 Generalized anxiety disorder: Secondary | ICD-10-CM

## 2011-04-22 ENCOUNTER — Encounter (HOSPITAL_COMMUNITY): Payer: Self-pay | Admitting: *Deleted

## 2011-04-22 ENCOUNTER — Emergency Department (HOSPITAL_COMMUNITY)
Admission: EM | Admit: 2011-04-22 | Discharge: 2011-04-22 | Disposition: A | Discharge status: Home / Self Care | Payer: BC Managed Care – PPO | Attending: Emergency Medicine | Admitting: Emergency Medicine

## 2011-04-22 DIAGNOSIS — F329 Major depressive disorder, single episode, unspecified: Secondary | ICD-10-CM | POA: Insufficient documentation

## 2011-04-22 DIAGNOSIS — K219 Gastro-esophageal reflux disease without esophagitis: Secondary | ICD-10-CM | POA: Insufficient documentation

## 2011-04-22 DIAGNOSIS — R109 Unspecified abdominal pain: Secondary | ICD-10-CM | POA: Insufficient documentation

## 2011-04-22 DIAGNOSIS — F3289 Other specified depressive episodes: Secondary | ICD-10-CM | POA: Insufficient documentation

## 2011-04-22 DIAGNOSIS — R197 Diarrhea, unspecified: Secondary | ICD-10-CM | POA: Insufficient documentation

## 2011-04-22 DIAGNOSIS — R110 Nausea: Secondary | ICD-10-CM | POA: Insufficient documentation

## 2011-04-22 DIAGNOSIS — R6883 Chills (without fever): Secondary | ICD-10-CM | POA: Insufficient documentation

## 2011-04-22 DIAGNOSIS — N898 Other specified noninflammatory disorders of vagina: Secondary | ICD-10-CM | POA: Insufficient documentation

## 2011-04-22 DIAGNOSIS — K589 Irritable bowel syndrome without diarrhea: Secondary | ICD-10-CM | POA: Insufficient documentation

## 2011-04-22 LAB — URINALYSIS, ROUTINE W REFLEX MICROSCOPIC
Glucose, UA: NEGATIVE mg/dL
Ketones, ur: NEGATIVE mg/dL
Leukocytes, UA: NEGATIVE
Nitrite: NEGATIVE
Protein, ur: NEGATIVE mg/dL

## 2011-04-22 LAB — BASIC METABOLIC PANEL
Calcium: 9.2 mg/dL (ref 8.4–10.5)
Chloride: 103 mEq/L (ref 96–112)
Creatinine, Ser: 0.75 mg/dL (ref 0.50–1.10)
GFR calc Af Amer: >90 mL/min (ref >90)
GFR calc non Af Amer: >90 mL/min (ref >90)

## 2011-04-22 LAB — CBC
HCT: 40.6 % (ref 36.0–46.0)
MCHC: 33.7 g/dL (ref 30.0–36.0)
RDW: 13 % (ref 11.5–15.5)

## 2011-04-22 LAB — DIFFERENTIAL
Basophils Absolute: 0 10*3/uL (ref 0.0–0.1)
Basophils Relative: 0 % (ref 0–1)
Eosinophils Absolute: 0.3 10*3/uL (ref 0.0–0.7)
Eosinophils Relative: 3 % (ref 0–5)
Monocytes Absolute: 0.9 10*3/uL (ref 0.1–1.0)
Neutro Abs: 4.8 10*3/uL (ref 1.7–7.7)

## 2011-04-22 MED ORDER — OXYCODONE-ACETAMINOPHEN 5-325 MG PO TABS
1.0000 | ORAL_TABLET | Freq: Once | ORAL | Status: AC
  Administered 2011-04-22: 1 via ORAL
  Filled 2011-04-22: qty 1

## 2011-04-22 MED ORDER — SODIUM CHLORIDE 0.9 % IV BOLUS (SEPSIS)
500.0000 mL | Freq: Once | INTRAVENOUS | Status: AC
  Administered 2011-04-22: 500 mL via INTRAVENOUS

## 2011-04-22 MED ORDER — SODIUM CHLORIDE 0.9 % IV BOLUS (SEPSIS)
1000.0000 mL | Freq: Once | INTRAVENOUS | Status: AC
  Administered 2011-04-22: 1000 mL via INTRAVENOUS

## 2011-04-22 NOTE — ED Notes (Signed)
Hx of IBS per pt. Pt states past week her stools have been "green with mucus"

## 2011-04-22 NOTE — ED Provider Notes (Signed)
History     CSN: [id number] Arrival date & time: 04/22/2011  7:30 PM   First MD Initiated Contact with Patient 04/22/11 2015    Chief Complaint  Patient presents with  . Abdominal Pain    (Consider location/radiation/quality/duration/timing/severity/associated sxs/prior treatment) Patient is a 20 y.o. female presenting with abdominal pain. The history is provided by the patient.  Abdominal Pain The primary symptoms of the illness include abdominal pain, nausea, diarrhea and vaginal discharge. The primary symptoms of the illness do not include hematochezia, dysuria or vaginal bleeding. Episode onset: today. The onset of the illness was gradual.  The vaginal discharge is not associated with dysuria.  Additional symptoms associated with the illness include chills.  Pt reports recent unprotected sex this past week Now having some abd pain, and vaginal discharge She also reports diarrhea, but has h/o IBS, reports stool has been greenish in color   Past Medical History  Diagnosis Date  . IBS (irritable bowel syndrome)   . Acid reflux disease   . Major depression   . OCD (obsessive compulsive disorder)   . Anxiety   . Interstitial cystitis     History reviewed. No pertinent past surgical history.  Family History  Problem Relation Age of Onset  . Heart disease    . Arthritis    . Cancer    . Diabetes      History  Substance Use Topics  . Smoking status: Current Everyday Smoker -- 1.0 packs/day    Types: Cigarettes  . Smokeless tobacco: Not on file  . Alcohol Use: Yes     very little not often    OB History    Grav Para Term Preterm Abortions TAB SAB Ect Mult Living                  Review of Systems  Constitutional: Positive for chills.  Gastrointestinal: Positive for nausea, abdominal pain and diarrhea. Negative for hematochezia.  Genitourinary: Positive for vaginal discharge. Negative for dysuria and vaginal bleeding.    Allergies  Penicillins  Home  Medications   Current Outpatient Rx  Name Route Sig Dispense Refill  . CLONAZEPAM 0.5 MG PO TABS Oral Take 0.5 mg by mouth daily as needed. For anxiety     . LOMOTIL PO Oral Take 1 tablet by mouth daily as needed. For diarrhea    . PANTOPRAZOLE SODIUM 40 MG PO TBEC Oral Take 40 mg by mouth daily.      Marland Kitchen PAROXETINE HCL 12.5 MG PO TB24 Oral Take 12.5 mg by mouth every morning.      Marland Kitchen HYDROCODONE-ACETAMINOPHEN 7.5-325 MG PO TABS Oral Take 1 tablet by mouth every 4 (four) hours as needed. 48 tablet 0    BP 108/61  Pulse 65  Temp 98.6 F (37 C)  Resp 18  SpO2 100%  LMP 04/08/2011  Physical Exam CONSTITUTIONAL: Well developed/well nourished HEAD AND FACE: Normocephalic/atraumatic EYES: EOMI/PERRL ENMT: Mucous membranes moist NECK: supple no meningeal signs SPINE:entire spine nontender CV: S1/S2 noted, no murmurs/rubs/gallops noted LUNGS: Lungs are clear to auscultation bilaterally, no apparent distress ABDOMEN: soft, nontender, no rebound or guarding GU:no vag bleeding, no vag discharge, no cmt, no adenxal tenderness chaperone present NEURO: Pt is awake/alert, moves all extremitiesx4 EXTREMITIES: pulses normal, full ROM SKIN: warm, color normal PSYCH: no abnormalities of mood noted   ED Course  Procedures (including critical care time)  Labs Reviewed  CBC - Abnormal; Notable for the following:    WBC 11.0 (*)  All other components within normal limits  DIFFERENTIAL - Abnormal; Notable for the following:    Lymphs Abs 5.0 (*)    All other components within normal limits  BASIC METABOLIC PANEL - Abnormal; Notable for the following:    Glucose, Bld 121 (*)    All other components within normal limits  URINALYSIS, ROUTINE W REFLEX MICROSCOPIC  POCT PREGNANCY, URINE  POCT PREGNANCY, URINE  GC/CHLAMYDIA PROBE AMP, GENITAL  WET PREP, GENITAL      MDM  Nursing notes reviewed and considered in documentation All labs/vitals reviewed and considered   Pt initial BP  low, but given IV fluids and no further symptoms.  She reports increase in her diarrhea recently She is ambulatory Her abd exam was unremarkable, pt reports all of her symptoms are resolved.          Joya Gaskins, MD 04/22/11 2204

## 2011-04-22 NOTE — ED Notes (Signed)
Pt does not have driver present. Pt informed of driver needed since percocet given at 20:59pm this evening.

## 2011-04-22 NOTE — ED Notes (Signed)
Abdominal pain with vaginal discharge

## 2011-04-24 LAB — GC/CHLAMYDIA PROBE AMP, GENITAL: GC Probe Amp, Genital: NEGATIVE

## 2011-07-02 DIAGNOSIS — Z8619 Personal history of other infectious and parasitic diseases: Secondary | ICD-10-CM

## 2011-07-02 HISTORY — DX: Personal history of other infectious and parasitic diseases: Z86.19

## 2011-12-29 ENCOUNTER — Encounter (HOSPITAL_COMMUNITY): Payer: Self-pay | Admitting: Emergency Medicine

## 2011-12-29 ENCOUNTER — Emergency Department (HOSPITAL_COMMUNITY)
Admission: EM | Admit: 2011-12-29 | Discharge: 2011-12-29 | Disposition: A | Discharge status: Home / Self Care | Payer: BC Managed Care – PPO | Attending: Emergency Medicine | Admitting: Emergency Medicine

## 2011-12-29 ENCOUNTER — Emergency Department (HOSPITAL_COMMUNITY): Payer: BC Managed Care – PPO

## 2011-12-29 DIAGNOSIS — F17200 Nicotine dependence, unspecified, uncomplicated: Secondary | ICD-10-CM | POA: Insufficient documentation

## 2011-12-29 DIAGNOSIS — Z79899 Other long term (current) drug therapy: Secondary | ICD-10-CM | POA: Insufficient documentation

## 2011-12-29 DIAGNOSIS — F419 Anxiety disorder, unspecified: Secondary | ICD-10-CM

## 2011-12-29 DIAGNOSIS — K219 Gastro-esophageal reflux disease without esophagitis: Secondary | ICD-10-CM | POA: Insufficient documentation

## 2011-12-29 DIAGNOSIS — R079 Chest pain, unspecified: Secondary | ICD-10-CM | POA: Insufficient documentation

## 2011-12-29 DIAGNOSIS — R42 Dizziness and giddiness: Secondary | ICD-10-CM | POA: Insufficient documentation

## 2011-12-29 MED ORDER — PROMETHAZINE HCL 25 MG PO TABS: 12.5000 mg | ORAL_TABLET | Freq: Four times a day (QID) | ORAL | Status: DC | PRN

## 2011-12-29 MED ORDER — PROMETHAZINE HCL 12.5 MG PO TABS
12.5000 mg | ORAL_TABLET | Freq: Once | ORAL | Status: AC
  Administered 2011-12-29: 12.5 mg via ORAL
  Filled 2011-12-29: qty 1

## 2011-12-29 NOTE — ED Notes (Signed)
Patient with c/o left side chest pain and dizziness that started yesterday. Patient describes pain as dull that radiates to left arm.

## 2011-12-29 NOTE — ED Provider Notes (Signed)
Medical screening examination/treatment/procedure(s) were performed by non-physician practitioner and as supervising physician I was immediately available for consultation/collaboration.   Benny Lennert, MD 12/29/11 2227

## 2011-12-29 NOTE — Discharge Instructions (Signed)
Your vital signs are stable. Your chest x-ray is read as negative. Your electrocardiogram shows normal sinus rhythm. Please use the promethazine for nausea. Please see your primary physician for recheck in the office this week.Anxiety and Panic Attacks Anxiety is your body's way of reacting to real danger or something you think is a danger. It may be fear or worry over a situation like losing your job. Sometimes the cause is not known. A panic attack is made up of physical signs like sweating, shaking, or chest pain. Anxiety and panic attacks may start suddenly. They may be strong. They may come at any time of day, even while sleeping. They may come at any time of life. Panic attacks are scary, but they do not harm you physically.  HOME CARE  Avoid any known causes of your anxiety.   Try to relax. Yoga may help. Tell yourself everything will be okay.   Exercise often.   Get expert advice and help (therapy) to stop anxiety or attacks from happening.   Avoid caffeine, alcohol, and drugs.   Only take medicine as told by your doctor.  GET HELP RIGHT AWAY IF:  Your attacks seem different than normal attacks.   Your problems are getting worse or concern you.  MAKE SURE YOU:  Understand these instructions.   Will watch your condition.   Will get help right away if you are not doing well or get worse.  Document Released: 07/20/2010 Document Revised: 06/06/2011 Document Reviewed: 07/20/2010 [facility] Patient Information 2012 [place], [place].

## 2011-12-29 NOTE — ED Provider Notes (Signed)
History     CSN: [id number]  Arrival date & time 12/29/11  1425   None     Chief Complaint  Patient presents with  . Dizziness  . Chest Pain    (Consider location/radiation/quality/duration/timing/severity/associated sxs/prior treatment) HPI Comments: Patient states she has pain in the anterior chest radiating to the left side from time to time, and this is been going on for some months. Patient saying states it seems to be getting a little worse over last few days and on last evening she had problems sleeping because of this discomfort. She has not had any injury or trauma to the chest. Patient has not had any problems with cough and no hemoptysis. The patient has not had any recent operation procedures. The patient also states she has been under extreme stress recently. She has not tried anything other than her current medications (Klonopin and Paxil) for her chest discomfort.  Patient is a 21 y.o. female presenting with chest pain. The history is provided by the patient.  Chest Pain The chest pain began yesterday. Chest pain occurs frequently. The chest pain is worsening. Associated with: unknown. The severity of the pain is severe. The quality of the pain is described as aching, similar to previous episodes and tightness. The pain radiates to the left shoulder. Exacerbated by: nothing. Primary symptoms include shortness of breath, nausea, vomiting and dizziness. Pertinent negatives for primary symptoms include no syncope, no cough, no wheezing, no palpitations, no abdominal pain and no altered mental status.  Dizziness also occurs with nausea and vomiting.  Pertinent negatives for past medical history include no seizures.     Past Medical History  Diagnosis Date  . IBS (irritable bowel syndrome)   . Acid reflux disease   . Major depression   . OCD (obsessive compulsive disorder)   . Anxiety   . Interstitial cystitis     History reviewed. No pertinent past surgical  history.  Family History  Problem Relation Age of Onset  . Heart disease    . Arthritis    . Cancer    . Diabetes      History  Substance Use Topics  . Smoking status: Current Everyday Smoker -- 1.0 packs/day    Types: Cigarettes  . Smokeless tobacco: Not on file  . Alcohol Use: Yes     very little not often    OB History    Grav Para Term Preterm Abortions TAB SAB Ect Mult Living                  Review of Systems  Constitutional: Negative for activity change.       All ROS Neg except as noted in HPI  HENT: Negative for nosebleeds and neck pain.   Eyes: Negative for photophobia and discharge.  Respiratory: Positive for shortness of breath. Negative for cough and wheezing.   Cardiovascular: Positive for chest pain. Negative for palpitations and syncope.  Gastrointestinal: Positive for nausea and vomiting. Negative for abdominal pain and blood in stool.  Genitourinary: Negative for dysuria, frequency and hematuria.  Musculoskeletal: Negative for back pain and arthralgias.  Skin: Negative.   Neurological: Positive for dizziness. Negative for seizures and speech difficulty.  Psychiatric/Behavioral: Negative for hallucinations, confusion and altered mental status.    Allergies  Penicillins  Home Medications   Current Outpatient Rx  Name Route Sig Dispense Refill  . CLONAZEPAM 2 MG PO TABS Oral Take 1 mg by mouth 2 (two) times daily.    Marland Kitchen  PAROXETINE HCL 40 MG PO TABS Oral Take 40 mg by mouth daily.      BP 109/57  Pulse 75  Temp 98.3 F (36.8 C) (Oral)  Resp 18  Ht 5\' 9"  (1.753 m)  Wt 150 lb (68.04 kg)  BMI 22.15 kg/m2  SpO2 100%  LMP 11/23/2011  Physical Exam  Nursing note and vitals reviewed. Constitutional: She is oriented to person, place, and time. She appears well-developed and well-nourished.  Non-toxic appearance.  HENT:  Head: Normocephalic.  Right Ear: Tympanic membrane and external ear normal.  Left Ear: Tympanic membrane and external ear  normal.  Eyes: EOM and lids are normal. Pupils are equal, round, and reactive to light.  Neck: Normal range of motion. Neck supple. Carotid bruit is not present.  Cardiovascular: Normal rate, regular rhythm, normal heart sounds, intact distal pulses and normal pulses.   No murmur heard. Pulmonary/Chest: Breath sounds normal. No respiratory distress. She exhibits tenderness.  Abdominal: Soft. Bowel sounds are normal. There is no tenderness. There is no guarding.  Musculoskeletal: Normal range of motion.       Negative Homans sign. No swelling or redness of the lower extremities.  Lymphadenopathy:       Head (right side): No submandibular adenopathy present.       Head (left side): No submandibular adenopathy present.    She has no cervical adenopathy.  Neurological: She is alert and oriented to person, place, and time. She has normal strength. No cranial nerve deficit or sensory deficit.  Skin: Skin is warm and dry.  Psychiatric: Her speech is normal. Her mood appears anxious.    ED Course  Procedures (including critical care time)  Labs Reviewed - No data to display No results found. EKG: normal EKG, normal sinus rhythm, there are no previous tracings available for comparison.  No diagnosis found.    MDM  I have reviewed nursing notes, vital signs, and all appropriate lab and imaging results for this patient. Patient has been having some pain in the anterior chest with radiation into the left axilla for some time now according to the patient. It is occasionally associated with nausea and vomiting. Seems to be worst at night. Keeps her from resting at night. The been no sweats. There's been no loss of consciousness. The patient has not had any previous cardiac related problems. She does suffer from major depression and anxiety and obsessive compulsive disorder.  The electrocardiogram is read as normal sinus rhythm without any acute findings. The vital signs are well within normal  limits. The pregnancy test is negative. And the chest x-ray is well within normal limits. Feel that it is safe for the patient to be discharged home. Advised patient to see her primary physician for adjustment in her anxiety related medications.       Kathie Dike, [place] 12/29/11 [phone]

## 2012-01-30 ENCOUNTER — Other Ambulatory Visit (HOSPITAL_COMMUNITY)
Admission: RE | Admit: 2012-01-30 | Discharge: 2012-01-30 | Disposition: A | Discharge status: Home / Self Care | Payer: Medicaid Other | Source: Ambulatory Visit | Attending: Obstetrics and Gynecology | Admitting: Obstetrics and Gynecology

## 2012-01-30 ENCOUNTER — Other Ambulatory Visit: Payer: Self-pay | Admitting: Family Medicine

## 2012-01-30 DIAGNOSIS — Z113 Encounter for screening for infections with a predominantly sexual mode of transmission: Secondary | ICD-10-CM | POA: Insufficient documentation

## 2012-01-30 DIAGNOSIS — Z01419 Encounter for gynecological examination (general) (routine) without abnormal findings: Secondary | ICD-10-CM | POA: Insufficient documentation

## 2012-01-30 DIAGNOSIS — R87810 Cervical high risk human papillomavirus (HPV) DNA test positive: Secondary | ICD-10-CM | POA: Insufficient documentation

## 2012-01-30 LAB — OB RESULTS CONSOLE HIV ANTIBODY (ROUTINE TESTING): HIV: NONREACTIVE

## 2012-01-30 LAB — OB RESULTS CONSOLE HGB/HCT, BLOOD
HCT: 40 %
Hemoglobin: 15.3 g/dL

## 2012-01-30 LAB — OB RESULTS CONSOLE ANTIBODY SCREEN: Antibody Screen: NEGATIVE

## 2012-01-30 LAB — OB RESULTS CONSOLE ABO/RH

## 2012-03-09 ENCOUNTER — Encounter (HOSPITAL_COMMUNITY): Payer: Self-pay | Admitting: *Deleted

## 2012-03-09 ENCOUNTER — Emergency Department (HOSPITAL_COMMUNITY)
Admission: EM | Admit: 2012-03-09 | Discharge: 2012-03-09 | Disposition: A | Discharge status: Home / Self Care | Payer: Medicaid Other | Attending: Emergency Medicine | Admitting: Emergency Medicine

## 2012-03-09 DIAGNOSIS — O99330 Smoking (tobacco) complicating pregnancy, unspecified trimester: Secondary | ICD-10-CM | POA: Insufficient documentation

## 2012-03-09 DIAGNOSIS — K219 Gastro-esophageal reflux disease without esophagitis: Secondary | ICD-10-CM | POA: Insufficient documentation

## 2012-03-09 DIAGNOSIS — O219 Vomiting of pregnancy, unspecified: Secondary | ICD-10-CM

## 2012-03-09 DIAGNOSIS — F429 Obsessive-compulsive disorder, unspecified: Secondary | ICD-10-CM | POA: Insufficient documentation

## 2012-03-09 DIAGNOSIS — O210 Mild hyperemesis gravidarum: Secondary | ICD-10-CM | POA: Insufficient documentation

## 2012-03-09 DIAGNOSIS — O99891 Other specified diseases and conditions complicating pregnancy: Secondary | ICD-10-CM | POA: Insufficient documentation

## 2012-03-09 DIAGNOSIS — O99340 Other mental disorders complicating pregnancy, unspecified trimester: Secondary | ICD-10-CM | POA: Insufficient documentation

## 2012-03-09 HISTORY — DX: Encounter for supervision of normal pregnancy, unspecified, unspecified trimester: Z34.90

## 2012-03-09 LAB — URINE MICROSCOPIC-ADD ON

## 2012-03-09 LAB — URINALYSIS, ROUTINE W REFLEX MICROSCOPIC
Glucose, UA: NEGATIVE mg/dL
pH: 5.5 (ref 5.0–8.0)

## 2012-03-09 LAB — POCT I-STAT, CHEM 8
BUN: 5 mg/dL — ABNORMAL LOW (ref 6–23)
Calcium, Ion: 1.16 mmol/L (ref 1.12–1.23)
TCO2: 20 mmol/L (ref 0–100)

## 2012-03-09 LAB — PREGNANCY, URINE: Preg Test, Ur: POSITIVE — AB

## 2012-03-09 MED ORDER — ONDANSETRON HCL 4 MG/2ML IJ SOLN
4.0000 mg | Freq: Once | INTRAMUSCULAR | Status: AC
  Administered 2012-03-09: 4 mg via INTRAVENOUS
  Filled 2012-03-09: qty 2

## 2012-03-09 MED ORDER — SODIUM CHLORIDE 0.9 % IV BOLUS (SEPSIS)
1000.0000 mL | Freq: Once | INTRAVENOUS | Status: AC
  Administered 2012-03-09: 1000 mL via INTRAVENOUS

## 2012-03-09 MED ORDER — ONDANSETRON HCL 4 MG PO TABS: 4.0000 mg | ORAL_TABLET | Freq: Three times a day (TID) | ORAL | Status: AC | PRN

## 2012-03-09 NOTE — ED Provider Notes (Signed)
History     CSN: [id number]  Arrival date & time 03/09/12  4098   First MD Initiated Contact with Patient 03/09/12 2011      Chief Complaint  Patient presents with  . Emesis During Pregnancy    (Consider location/radiation/quality/duration/timing/severity/associated sxs/prior treatment) HPI Pt reports she is approx [redacted]wks pregnant followed at Dr. Rayna Sexton office. States she has had vomiting off and on throughout her pregnancy without much warning. She has Rx for antiemetics which have helped some. Last vomited this morning. She ate some crackers during the day today. No abdominal pain, no vaginal bleeding or discharge.   Past Medical History  Diagnosis Date  . IBS (irritable bowel syndrome)   . Acid reflux disease   . Major depression   . OCD (obsessive compulsive disorder)   . Anxiety   . Interstitial cystitis   . Pregnancy     History reviewed. No pertinent past surgical history.  Family History  Problem Relation Age of Onset  . Heart disease    . Arthritis    . Cancer    . Diabetes      History  Substance Use Topics  . Smoking status: Current Everyday Smoker -- 1.0 packs/day    Types: Cigarettes  . Smokeless tobacco: Not on file  . Alcohol Use: No     very little not often    OB History    Grav Para Term Preterm Abortions TAB SAB Ect Mult Living   1               Review of Systems All other systems reviewed and are negative except as noted in HPI.   Allergies  Penicillins  Home Medications   Current Outpatient Rx  Name Route Sig Dispense Refill  . FLINTSTONES COMPLETE 60 MG PO CHEW Oral Chew 1 tablet by mouth daily.      BP 129/59  Pulse 93  Temp 98.9 F (37.2 C) (Oral)  SpO2 100%  LMP 11/25/2011  Physical Exam  Nursing note and vitals reviewed. Constitutional: She is oriented to person, place, and time. She appears well-developed and well-nourished.  HENT:  Head: Normocephalic and atraumatic.  Eyes: EOM are normal. Pupils are equal,  round, and reactive to light.  Neck: Normal range of motion. Neck supple.  Cardiovascular: Normal rate, normal heart sounds and intact distal pulses.   Pulmonary/Chest: Effort normal and breath sounds normal.  Abdominal: Bowel sounds are normal. She exhibits no distension. There is no tenderness.  Musculoskeletal: Normal range of motion. She exhibits no edema and no tenderness.  Neurological: She is alert and oriented to person, place, and time. She has normal strength. No cranial nerve deficit or sensory deficit.  Skin: Skin is warm and dry. No rash noted.  Psychiatric: She has a normal mood and affect.    ED Course  Procedures (including critical care time)  Labs Reviewed  URINALYSIS, ROUTINE W REFLEX MICROSCOPIC - Abnormal; Notable for the following:    Leukocytes, UA SMALL (*)     All other components within normal limits  PREGNANCY, URINE - Abnormal; Notable for the following:    Preg Test, Ur POSITIVE (*)     All other components within normal limits  URINE MICROSCOPIC-ADD ON - Abnormal; Notable for the following:    Squamous Epithelial / LPF FEW (*)     Bacteria, UA FEW (*)     All other components within normal limits   No results found.   No diagnosis found.  MDM  Pt with vomiting in pregnancy but none since earlier today. Will check I-stat, doubt significant elyte disturbance.        Charles B. Bernette Mayers, MD 03/09/12 2153

## 2012-03-09 NOTE — ED Notes (Addendum)
Vomiting throughout pregnancy, says it is worse.  Says she feels weak, abd cramping , no bleeding.

## 2012-03-19 LAB — OB RESULTS CONSOLE GC/CHLAMYDIA: Chlamydia: NEGATIVE

## 2012-05-29 ENCOUNTER — Emergency Department (HOSPITAL_COMMUNITY)
Admission: EM | Admit: 2012-05-29 | Discharge: 2012-05-29 | Disposition: A | Discharge status: Home / Self Care | Payer: Medicaid Other | Attending: Emergency Medicine | Admitting: Emergency Medicine

## 2012-05-29 ENCOUNTER — Encounter (HOSPITAL_COMMUNITY): Payer: Self-pay | Admitting: *Deleted

## 2012-05-29 DIAGNOSIS — K219 Gastro-esophageal reflux disease without esophagitis: Secondary | ICD-10-CM | POA: Insufficient documentation

## 2012-05-29 DIAGNOSIS — R197 Diarrhea, unspecified: Secondary | ICD-10-CM | POA: Insufficient documentation

## 2012-05-29 DIAGNOSIS — K5289 Other specified noninfective gastroenteritis and colitis: Secondary | ICD-10-CM | POA: Insufficient documentation

## 2012-05-29 DIAGNOSIS — Z8659 Personal history of other mental and behavioral disorders: Secondary | ICD-10-CM | POA: Insufficient documentation

## 2012-05-29 DIAGNOSIS — K589 Irritable bowel syndrome without diarrhea: Secondary | ICD-10-CM | POA: Insufficient documentation

## 2012-05-29 DIAGNOSIS — O99330 Smoking (tobacco) complicating pregnancy, unspecified trimester: Secondary | ICD-10-CM | POA: Insufficient documentation

## 2012-05-29 DIAGNOSIS — F429 Obsessive-compulsive disorder, unspecified: Secondary | ICD-10-CM | POA: Insufficient documentation

## 2012-05-29 DIAGNOSIS — Z8742 Personal history of other diseases of the female genital tract: Secondary | ICD-10-CM | POA: Insufficient documentation

## 2012-05-29 DIAGNOSIS — R112 Nausea with vomiting, unspecified: Secondary | ICD-10-CM | POA: Insufficient documentation

## 2012-05-29 DIAGNOSIS — K529 Noninfective gastroenteritis and colitis, unspecified: Secondary | ICD-10-CM

## 2012-05-29 DIAGNOSIS — Z79899 Other long term (current) drug therapy: Secondary | ICD-10-CM | POA: Insufficient documentation

## 2012-05-29 LAB — URINALYSIS, ROUTINE W REFLEX MICROSCOPIC
Ketones, ur: 40 mg/dL — AB
Nitrite: NEGATIVE
Protein, ur: NEGATIVE mg/dL
Urobilinogen, UA: 0.2 mg/dL (ref 0.0–1.0)

## 2012-05-29 LAB — CBC WITH DIFFERENTIAL/PLATELET
Basophils Absolute: 0 10*3/uL (ref 0.0–0.1)
Basophils Relative: 0 % (ref 0–1)
Eosinophils Absolute: 0.2 10*3/uL (ref 0.0–0.7)
Eosinophils Relative: 1 % (ref 0–5)
Lymphocytes Relative: 7 % — ABNORMAL LOW (ref 12–46)
MCHC: 34.6 g/dL (ref 30.0–36.0)
MCV: 88.5 fL (ref 78.0–100.0)
Platelets: 299 10*3/uL (ref 150–400)
RDW: 13.8 % (ref 11.5–15.5)
WBC: 14.4 10*3/uL — ABNORMAL HIGH (ref 4.0–10.5)

## 2012-05-29 LAB — URINE MICROSCOPIC-ADD ON

## 2012-05-29 LAB — BASIC METABOLIC PANEL
CO2: 22 mEq/L (ref 19–32)
Calcium: 8.6 mg/dL (ref 8.4–10.5)
GFR calc Af Amer: >90 mL/min (ref >90)
GFR calc non Af Amer: >90 mL/min (ref >90)
Sodium: 137 mEq/L (ref 135–145)

## 2012-05-29 MED ORDER — SODIUM CHLORIDE 0.9 % IV SOLN
1000.0000 mL | INTRAVENOUS | Status: DC
  Administered 2012-05-29: 1000 mL via INTRAVENOUS

## 2012-05-29 MED ORDER — ONDANSETRON HCL 4 MG/2ML IJ SOLN
4.0000 mg | Freq: Once | INTRAMUSCULAR | Status: AC
  Administered 2012-05-29: 4 mg via INTRAVENOUS
  Filled 2012-05-29: qty 2

## 2012-05-29 MED ORDER — SODIUM CHLORIDE 0.9 % IV SOLN
1000.0000 mL | Freq: Once | INTRAVENOUS | Status: AC
  Administered 2012-05-29: 1000 mL via INTRAVENOUS

## 2012-05-29 MED ORDER — ONDANSETRON 4 MG PO TBDP: 4.0000 mg | ORAL_TABLET | Freq: Three times a day (TID) | ORAL | Status: DC | PRN

## 2012-05-29 NOTE — ED Provider Notes (Signed)
Medical screening examination/treatment/procedure(s) were performed by non-physician practitioner and as supervising physician I was immediately available for consultation/collaboration.   Dione Booze, MD 05/29/12 [phone]

## 2012-05-29 NOTE — ED Notes (Signed)
Pt had ice chips which have stayed down.

## 2012-05-29 NOTE — ED Notes (Signed)
Pt states vomiting and diarrhea began at 0200 this morning. Pt is also [redacted] weeks pregnant.

## 2012-05-29 NOTE — ED Provider Notes (Signed)
History     CSN: [id number]  Arrival date & time 05/29/12  4098   First MD Initiated Contact with Patient 05/29/12 0902      Chief Complaint  Patient presents with  . Emesis    (Consider location/radiation/quality/duration/timing/severity/associated sxs/prior treatment) HPI Comments: ~ 10 episodes of vomiting and 4 episodes of diarrhea.  Last diarrhea ~ 1 hr ago and last vomiting just before my exam.  "not sure if it was something i ate".  No other family members ill.  No fever or chills.  Mild abdominal crampiness but denies pain.  Pt is [redacted] weeks pregnant.  No problems with pregnancy.  G1P0A0.  Pt of drs. Emelda Fear and [employer].  luking is PCP.  No pelvic pain or vaginal bleeding.  No UTI sxs.  Feels weak with standing.  Has not taken any meds for sxs.  Patient is a 21 y.o. female presenting with vomiting. The history is provided by the patient. No language interpreter was used.  Emesis  This is a new problem. Episode onset: started ~ 0100 today.   The problem has not changed since onset.The emesis has an appearance of stomach contents. There has been no fever. Associated symptoms include diarrhea. Pertinent negatives include no abdominal pain, no chills, no cough, no fever and no URI.    Past Medical History  Diagnosis Date  . IBS (irritable bowel syndrome)   . Acid reflux disease   . Major depression   . OCD (obsessive compulsive disorder)   . Anxiety   . Interstitial cystitis   . Pregnancy     History reviewed. No pertinent past surgical history.  Family History  Problem Relation Age of Onset  . Heart disease    . Arthritis    . Cancer    . Diabetes      History  Substance Use Topics  . Smoking status: Current Every Day Smoker -- 1.0 packs/day    Types: Cigarettes  . Smokeless tobacco: Not on file  . Alcohol Use: No     Comment: very little not often    OB History    Grav Para Term Preterm Abortions TAB SAB Ect Mult Living   1               Review of Systems    Constitutional: Negative for fever and chills.  Respiratory: Negative for cough.   Gastrointestinal: Positive for nausea, vomiting and diarrhea. Negative for abdominal pain.  Genitourinary: Positive for decreased urine volume. Negative for dysuria, urgency, frequency, hematuria, vaginal bleeding, vaginal discharge, difficulty urinating and pelvic pain.  Musculoskeletal: Negative for back pain.  All other systems reviewed and are negative.    Allergies  Penicillins  Home Medications   Current Outpatient Rx  Name  Route  Sig  Dispense  Refill  . FLINTSTONES COMPLETE 60 MG PO CHEW   Oral   Chew 1 tablet by mouth daily.         Marland Kitchen ONDANSETRON 4 MG PO TBDP   Oral   Take 1 tablet (4 mg total) by mouth every 8 (eight) hours as needed for nausea.   10 tablet   0     BP 105/46  Pulse 90  Temp 98 F (36.7 C) (Oral)  Resp 20  Ht 5\' 8"  (1.727 m)  Wt 168 lb (76.204 kg)  BMI 25.54 kg/m2  SpO2 100%  LMP 11/25/2011  Physical Exam  Nursing note and vitals reviewed. Constitutional: She is oriented to person, place, and time.  She appears well-developed and well-nourished. No distress.  HENT:  Head: Normocephalic and atraumatic.  Eyes: EOM are normal.  Neck: Normal range of motion.  Cardiovascular: Regular rhythm.   Pulmonary/Chest: Effort normal and breath sounds normal.  Abdominal: Soft. Normal appearance and bowel sounds are normal. She exhibits no distension and no mass. There is no hepatosplenomegaly. There is no tenderness. There is no rebound, no guarding and no CVA tenderness.       + gravid.  Uterine fundus ~ 5 cm superior to umbilicus.  FHT per RN 140.  Genitourinary: No vaginal discharge found.  Musculoskeletal: Normal range of motion.  Neurological: She is alert and oriented to person, place, and time.  Skin: Skin is warm and dry.  Psychiatric: She has a normal mood and affect. Judgment normal.    ED Course  Procedures (including critical care time)  Labs  Reviewed  CBC WITH DIFFERENTIAL - Abnormal; Notable for the following:    WBC 14.4 (*)     RBC 3.66 (*)     Hemoglobin 11.2 (*)     HCT 32.4 (*)     Neutrophils Relative 88 (*)     Neutro Abs 12.7 (*)     Lymphocytes Relative 7 (*)     All other components within normal limits  BASIC METABOLIC PANEL - Abnormal; Notable for the following:    Creatinine, Ser 0.47 (*)     All other components within normal limits  URINALYSIS, ROUTINE W REFLEX MICROSCOPIC - Abnormal; Notable for the following:    Specific Gravity, Urine >1.030 (*)     Ketones, ur 40 (*)     Leukocytes, UA TRACE (*)     All other components within normal limits  URINE MICROSCOPIC-ADD ON - Abnormal; Notable for the following:    Squamous Epithelial / LPF FEW (*)     All other components within normal limits   No results found. 1115-pt has had > 100 ml of IVF and states she feels much better.  She has not any vomiting or diarrhea since i evaluated.  She says she feels well enough to go home.  1. Gastroenteritis       MDM  rx-zofran 4 mg ODT OTC imodium for diarrhea prn F/u with PCP prn        Evalina Field, PA 05/29/12 1131

## 2012-05-31 DIAGNOSIS — B009 Herpesviral infection, unspecified: Secondary | ICD-10-CM

## 2012-05-31 HISTORY — DX: Herpesviral infection, unspecified: B00.9

## 2012-07-01 NOTE — L&D Delivery Note (Signed)
Operative Delivery Note At 4:22 AM a viable and healthy female was delivered via Vaginal, [employer], operational).  Presentation: vertex; Position: Occiput,, Anterior; Station: +2.  Verbal consent: obtained from patient.  Risks and benefits discussed in detail.  Risks include, but are not limited to the risks of anesthesia, bleeding, infection, damage to maternal tissues, fetal cephalhematoma.  There is also the risk of inability to effect vaginal delivery of the head, or shoulder dystocia that cannot be resolved by established maneuvers, leading to the need for emergency cesarean section.  All reviewed by Dr. Marice Potter.   APGAR: 8, 9 ; weight: pending.   Placenta status: Intact, Spontaneous.   Cord:  3 vessel   Anesthesia: Epidural  Instruments: Kiwi Episiotomy: None Lacerations: 1st degree;Perineal Suture Repair: 3.0 vicryl Est. Blood Loss (mL): 300  Mom to postpartum.  Baby to nursery-stable.  Tracy Flores 09/08/2012, 4:33 AM

## 2012-07-01 NOTE — L&D Delivery Note (Signed)
I have seen the patient with the resident/student and agree with the above.  Hogan, Heather Donovan  

## 2012-08-19 LAB — OB RESULTS CONSOLE GBS: GBS: NEGATIVE

## 2012-08-19 LAB — OB RESULTS CONSOLE GC/CHLAMYDIA: Gonorrhea: NEGATIVE

## 2012-09-07 ENCOUNTER — Inpatient Hospital Stay (HOSPITAL_COMMUNITY): Payer: Medicaid Other | Admitting: Anesthesiology

## 2012-09-07 ENCOUNTER — Encounter (HOSPITAL_COMMUNITY): Payer: Self-pay

## 2012-09-07 ENCOUNTER — Encounter (HOSPITAL_COMMUNITY): Payer: Self-pay | Admitting: Anesthesiology

## 2012-09-07 ENCOUNTER — Inpatient Hospital Stay (HOSPITAL_COMMUNITY)
Admission: AD | Admit: 2012-09-07 | Discharge: 2012-09-10 | DRG: 774 | Disposition: A | Discharge status: Home / Self Care | Payer: Medicaid Other | Source: Ambulatory Visit | Attending: Obstetrics & Gynecology | Admitting: Obstetrics & Gynecology

## 2012-09-07 DIAGNOSIS — O99344 Other mental disorders complicating childbirth: Secondary | ICD-10-CM | POA: Diagnosis present

## 2012-09-07 DIAGNOSIS — A6000 Herpesviral infection of urogenital system, unspecified: Secondary | ICD-10-CM | POA: Diagnosis present

## 2012-09-07 DIAGNOSIS — O98519 Other viral diseases complicating pregnancy, unspecified trimester: Secondary | ICD-10-CM | POA: Diagnosis present

## 2012-09-07 DIAGNOSIS — F429 Obsessive-compulsive disorder, unspecified: Secondary | ICD-10-CM | POA: Diagnosis present

## 2012-09-07 LAB — CBC
MCH: 29.6 pg (ref 26.0–34.0)
MCHC: 34.2 g/dL (ref 30.0–36.0)
Platelets: 256 10*3/uL (ref 150–400)
RBC: 4.12 MIL/uL (ref 3.87–5.11)
RDW: 13.3 % (ref 11.5–15.5)

## 2012-09-07 LAB — RPR: RPR Ser Ql: NONREACTIVE

## 2012-09-07 MED ORDER — CITRIC ACID-SODIUM CITRATE 334-500 MG/5ML PO SOLN: 30.0000 mL | ORAL | Status: DC | PRN

## 2012-09-07 MED ORDER — LACTATED RINGERS IV SOLN
500.0000 mL | INTRAVENOUS | Status: DC | PRN
  Administered 2012-09-07: 300 mL via INTRAVENOUS
  Administered 2012-09-07 (×2): 500 mL via INTRAVENOUS
  Administered 2012-09-07: 300 mL via INTRAVENOUS
  Administered 2012-09-07: 250 mL via INTRAVENOUS

## 2012-09-07 MED ORDER — OXYTOCIN BOLUS FROM INFUSION
500.0000 mL | INTRAVENOUS | Status: DC
  Administered 2012-09-08: 500 mL via INTRAVENOUS

## 2012-09-07 MED ORDER — LACTATED RINGERS IV SOLN
INTRAVENOUS | Status: DC
  Administered 2012-09-07 (×2): via INTRAVENOUS

## 2012-09-07 MED ORDER — LACTATED RINGERS IV SOLN
INTRAVENOUS | Status: DC
  Administered 2012-09-07: 22:00:00 via INTRAUTERINE

## 2012-09-07 MED ORDER — OXYCODONE-ACETAMINOPHEN 5-325 MG PO TABS: 1.0000 | ORAL_TABLET | ORAL | Status: DC | PRN

## 2012-09-07 MED ORDER — FENTANYL 2.5 MCG/ML BUPIVACAINE 1/10 % EPIDURAL INFUSION (WH - ANES)
14.0000 mL/h | INTRAMUSCULAR | Status: DC | PRN
  Administered 2012-09-07: 14 mL/h via EPIDURAL
  Filled 2012-09-07 (×2): qty 125

## 2012-09-07 MED ORDER — EPHEDRINE 5 MG/ML INJ
10.0000 mg | INTRAVENOUS | Status: AC | PRN
  Administered 2012-09-07 (×2): 10 mg via INTRAVENOUS

## 2012-09-07 MED ORDER — ACYCLOVIR 200 MG PO CAPS
400.0000 mg | ORAL_CAPSULE | Freq: Two times a day (BID) | ORAL | Status: DC
  Administered 2012-09-07 (×2): 400 mg via ORAL
  Filled 2012-09-07 (×3): qty 2

## 2012-09-07 MED ORDER — OXYTOCIN 40 UNITS IN LACTATED RINGERS INFUSION - SIMPLE MED
1.0000 m[IU]/min | INTRAVENOUS | Status: DC
  Administered 2012-09-07 (×2): 2 m[IU]/min via INTRAVENOUS
  Administered 2012-09-08: 1 m[IU]/min via INTRAVENOUS
  Filled 2012-09-07: qty 1000

## 2012-09-07 MED ORDER — IBUPROFEN 600 MG PO TABS: 600.0000 mg | ORAL_TABLET | Freq: Four times a day (QID) | ORAL | Status: DC | PRN

## 2012-09-07 MED ORDER — ACETAMINOPHEN 325 MG PO TABS
650.0000 mg | ORAL_TABLET | ORAL | Status: DC | PRN
  Administered 2012-09-08: 650 mg via ORAL
  Filled 2012-09-07: qty 2

## 2012-09-07 MED ORDER — FENTANYL 2.5 MCG/ML BUPIVACAINE 1/10 % EPIDURAL INFUSION (WH - ANES)
INTRAMUSCULAR | Status: DC | PRN
  Administered 2012-09-07: 14 mL/h via EPIDURAL

## 2012-09-07 MED ORDER — DIPHENHYDRAMINE HCL 50 MG/ML IJ SOLN: 12.5000 mg | INTRAMUSCULAR | Status: DC | PRN

## 2012-09-07 MED ORDER — ONDANSETRON HCL 4 MG/2ML IJ SOLN: 4.0000 mg | Freq: Four times a day (QID) | INTRAMUSCULAR | Status: DC | PRN

## 2012-09-07 MED ORDER — ACYCLOVIR 400 MG PO TABS
400.0000 mg | ORAL_TABLET | Freq: Two times a day (BID) | ORAL | Status: DC
  Filled 2012-09-07: qty 1

## 2012-09-07 MED ORDER — PHENYLEPHRINE 40 MCG/ML (10ML) SYRINGE FOR IV PUSH (FOR BLOOD PRESSURE SUPPORT)
80.0000 ug | PREFILLED_SYRINGE | INTRAVENOUS | Status: DC | PRN
  Filled 2012-09-07: qty 5

## 2012-09-07 MED ORDER — FENTANYL 2.5 MCG/ML BUPIVACAINE 1/10 % EPIDURAL INFUSION (WH - ANES): 14.0000 mL/h | INTRAMUSCULAR | Status: DC | PRN

## 2012-09-07 MED ORDER — OXYTOCIN 40 UNITS IN LACTATED RINGERS INFUSION - SIMPLE MED: 62.5000 mL/h | INTRAVENOUS | Status: DC

## 2012-09-07 MED ORDER — LACTATED RINGERS IV SOLN
500.0000 mL | Freq: Once | INTRAVENOUS | Status: AC
  Administered 2012-09-07: 500 mL via INTRAVENOUS

## 2012-09-07 MED ORDER — LIDOCAINE HCL (PF) 1 % IJ SOLN
INTRAMUSCULAR | Status: DC | PRN
  Administered 2012-09-07 (×2): 4 mL

## 2012-09-07 MED ORDER — LIDOCAINE HCL (PF) 1 % IJ SOLN
30.0000 mL | INTRAMUSCULAR | Status: DC | PRN
  Filled 2012-09-07: qty 30

## 2012-09-07 MED ORDER — TERBUTALINE SULFATE 1 MG/ML IJ SOLN: 0.2500 mg | Freq: Once | INTRAMUSCULAR | Status: AC | PRN

## 2012-09-07 MED ORDER — FLEET ENEMA 7-19 GM/118ML RE ENEM: 1.0000 | ENEMA | RECTAL | Status: DC | PRN

## 2012-09-07 MED ORDER — PHENYLEPHRINE 40 MCG/ML (10ML) SYRINGE FOR IV PUSH (FOR BLOOD PRESSURE SUPPORT): 80.0000 ug | PREFILLED_SYRINGE | INTRAVENOUS | Status: DC | PRN

## 2012-09-07 MED ORDER — EPHEDRINE 5 MG/ML INJ
10.0000 mg | INTRAVENOUS | Status: DC | PRN
  Filled 2012-09-07: qty 4

## 2012-09-07 NOTE — Anesthesia Preprocedure Evaluation (Signed)
Anesthesia Evaluation  Patient identified by MRN, date of birth, ID band Patient awake    Reviewed: Allergy & Precautions, H&P , Patient's Chart, lab work & pertinent test results  Airway Mallampati: III TM Distance: >3 FB Neck ROM: full    Dental no notable dental hx. (+) Teeth Intact   Pulmonary Current Smoker,  breath sounds clear to auscultation  Pulmonary exam normal       Cardiovascular negative cardio ROS  Rhythm:regular Rate:Normal     Neuro/Psych PSYCHIATRIC DISORDERS Anxiety Depression OCDnegative neurological ROS     GI/Hepatic Neg liver ROS, GERD-  Medicated and Controlled,IBS   Endo/Other  negative endocrine ROS  Renal/GU negative Renal ROS   Interstitial Cystitis     Musculoskeletal   Abdominal Normal abdominal exam  (+)   Peds  Hematology negative hematology ROS (+)   Anesthesia Other Findings   Reproductive/Obstetrics (+) Pregnancy                           Anesthesia Physical Anesthesia Plan  ASA: II  Anesthesia Plan: Epidural   Post-op Pain Management:    Induction:   Airway Management Planned:   Additional Equipment:   Intra-op Plan:   Post-operative Plan:   Informed Consent: I have reviewed the patients History and Physical, chart, labs and discussed the procedure including the risks, benefits and alternatives for the proposed anesthesia with the patient or authorized representative who has indicated his/her understanding and acceptance.     Plan Discussed with: Anesthesiologist  Anesthesia Plan Comments:         Anesthesia Quick Evaluation

## 2012-09-07 NOTE — Progress Notes (Signed)
Tracy Flores is a 22 y.o. G1P0 at [redacted]w[redacted]d by ultrasound admitted for rupture of membranes  Subjective: Feeling pressure, discomfort in L hip/back epidural helping well  Objective: BP 104/53  Pulse 96  Temp(Src) 99.5 F (37.5 C) (Axillary)  Resp 20  Ht 5\' 9"  (1.753 m)  Wt 87.091 kg (192 lb)  BMI 28.34 kg/m2  SpO2 98%  LMP 11/25/2011 I/O last 3 completed shifts: In: -  Out: 1100 [Urine:1100]    FHT:  FHR: 145 bpm, variability: moderate,  accelerations:  Present,  decelerations:  Present Some late, some variable UC:   irreg q 2-4 min SVE:   Dilation: Lip/rim Effacement (%): 100 Station: 0 Exam by:: Tracy Flores, cnm, Tracy Flores, D  Labs: Lab Results  Component Value Date   WBC 14.5* 09/07/2012   HGB 12.2 09/07/2012   HCT 35.7* 09/07/2012   MCV 86.7 09/07/2012   PLT 256 09/07/2012    Assessment / Plan: Augmentation of labor, progressing well  Labor: Good progression, on low dose pitocin 2/2 fetal intolerance with late declerations Fetal Wellbeing:  Category II, still with reasuring variability and accels, IUPC in place, fetal scalp electrode in place improved with interventions, reviewed with Dr. Marice Flores and will consider C section if she continues this pattern Pain Control:  Epidural I/D:  GBS negative Anticipated MOD:  NSVD  Tracy Flores 09/07/2012, 11:11 PM

## 2012-09-07 NOTE — H&P (Signed)
Tracy Flores is a 22 y.o. female presenting for SROM about 12:30 am this morning. She notes a slow leak of fluids that started after midnight. She began to have contractions that became more regular and strong, now at a10/10. She denies vaginal bleeding, discharge, and decreased fetal movement. 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 She denies chest pain, dyspnea, fevers, chills, and swelling. [facility] She has not had any problems during her pregnancy.   History OB History   Grav Para Term Preterm Abortions TAB SAB Ect Mult Living   1              Past Medical History  Diagnosis Date  . IBS (irritable bowel syndrome)   . Acid reflux disease   . Major depression   . OCD (obsessive compulsive disorder)   . Anxiety   . Interstitial cystitis   . Pregnancy    Past Surgical History  Procedure Laterality Date  . No past surgeries     Family History: family history includes Arthritis in an unspecified family member; Cancer in an unspecified family member; Diabetes in an unspecified family member; and Heart disease in an unspecified family member. Social History:  reports that she has been smoking Cigarettes.  She has been smoking about 1.00 pack per day. She has never used smokeless tobacco. She reports that she does not drink alcohol or use illicit drugs.   Prenatal Transfer Tool  Maternal Diabetes: No Genetic Screening: Normal Maternal Ultrasounds/Referrals: Normal Fetal Ultrasounds or other Referrals:  None Maternal Substance Abuse:  No Significant Maternal Medications:  Meds include: Other:  Significant Maternal Lab Results:  Lab values include: Other:  Other Comments:  HSV positive, on acyclovir. Gonorhea + but treated and negative test of cure on 2/21  ROS Per HPI  Dilation: 2.5 Effacement (%): 70 Station: -2 Exam by:: Dr. Ermalinda Memos Height 5\' 8"  (1.727  m), weight 87.181 kg (192 lb 3.2 oz), last menstrual period 11/25/2011. Exam Physical Exam  Gen: NAD, alert, cooperative with exam HEENT: NCAT, moist mucous membranes CV: RRR, good S1/S2, no murmur Resp: CTABL, no wheezes, non-labored Abd: Soft, pregnant abdomen Ext: No edema Neuro: Alert and oriented, No gross deficits  FHT: Baseline 130, moderate variability, accels present, some variable decels Toco: irreg q2-5 min  Prenatal labs: ABO, Rh:  A+ Antibody:  negative Rubella:  immune RPR:   negative HBsAg:   negative HIV:   negative GBS:   negative  Assessment/Plan: 22 y/o G1P0 here with SROM in early labor - Early labor, dilated 2-3 cm, will allow 2-3 hours to see if she changes on her own and consider Pitocin if not at that time - HSV + with no lesions during pregnancy, compliant with acyclovir- continue,  - Gonorrhea + on 01/29/2013, test of cure on 2/21 now negative - GBS negative - Category 2 tracing, rare variables, still with good variability and accels - Declines Epidural and IV pain meds for now, she will notify if she changes her mind - Anticipate SVD  Kevin Fenton 09/07/2012, 9:47 AM  I saw and examined patient along with student and agree with above note.   FRAZIER,NATALIE 09/07/2012 11:48 AM

## 2012-09-07 NOTE — Progress Notes (Signed)
Tracy Flores is a 22 y.o. G1P0 at [redacted]w[redacted]d by ultrasound admitted for rupture of membranes  Subjective: Feeling contractions more, epidural helping   Objective: BP 128/59  Pulse 85  Temp(Src) 98.1 F (36.7 C) (Axillary)  Resp 18  Ht 5\' 9"  (1.753 m)  Wt 87.091 kg (192 lb)  BMI 28.34 kg/m2  SpO2 97%  LMP 11/25/2011      FHT:  FHR: 140 bpm, variability: moderate,  accelerations:  Present,  decelerations:  Present occasional variable, 1 late right after epidural placed UC:   irreg q 2-5 min SVE:   Dilation: 4.5 Effacement (%): 90 Station: -2 Exam by:: Renaldo Harrison, RN  Labs: Lab Results  Component Value Date   WBC 14.5* 09/07/2012   HGB 12.2 09/07/2012   HCT 35.7* 09/07/2012   MCV 86.7 09/07/2012   PLT 256 09/07/2012    Assessment / Plan: Arrest in active phase of labor  Labor: Arrested, started pitocin Preeclampsia:  no signs or symptoms of toxicity Fetal Wellbeing:  Category II, still with reasuring variability and accels, IUPC placed Pain Control:  Epidural I/D:  GBS negative Anticipated MOD:  NSVD  Kevin Fenton 09/07/2012, 4:16 PM  I saw and examined patient along with student and agree with above note.   FRAZIER,NATALIE 09/10/2012 3:53 PM

## 2012-09-07 NOTE — Progress Notes (Signed)
Tanessa D Depass is a 22 y.o. G1P0 at [redacted]w[redacted]d by ultrasound admitted for rupture of membranes  Subjective: Feeling pressure, epidural helping well  Objective: BP 124/62  Pulse 79  Temp(Src) 98.8 F (37.1 C) (Axillary)  Resp 18  Ht 5\' 9"  (1.753 m)  Wt 87.091 kg (192 lb)  BMI 28.34 kg/m2  SpO2 97%  LMP 11/25/2011      FHT:  FHR: 140 bpm, variability: moderate,  accelerations:  Present,  decelerations:  Present Some late, some variable UC:   irreg q 2-4 min SVE:   Dilation: 5.5 Effacement (%): 90 Station: -1 Exam by:: Renaldo Harrison, RN  Labs: Lab Results  Component Value Date   WBC 14.5* 09/07/2012   HGB 12.2 09/07/2012   HCT 35.7* 09/07/2012   MCV 86.7 09/07/2012   PLT 256 09/07/2012    Assessment / Plan: Arrest in active phase of labor  Labor: Slow progression, started pitocin but had to stop due to decels Fetal Wellbeing:  Category II, still with reasuring variability and accels, IUPC in place, improved with interventions, reviewed with Dr. Marice Potter and will consider C section if she continues this pattern Pain Control:  Epidural I/D:  GBS negative Anticipated MOD:  NSVD  Kevin Fenton 09/07/2012, 5:40 PM

## 2012-09-07 NOTE — MAU Note (Signed)
Pt states contractions began getting regular around 0400, now q 5-7. Noted leaking of fluid around midnight. Notes clear fluid with every contraction.

## 2012-09-07 NOTE — Anesthesia Procedure Notes (Signed)
Epidural Patient location during procedure: OB Start time: 09/07/2012 2:34 PM  Staffing Anesthesiologist: FOSTER, MICHAEL A. Performed by: anesthesiologist   Preanesthetic Checklist Completed: patient identified, site marked, surgical consent, pre-op evaluation, timeout performed, IV checked, risks and benefits discussed and monitors and equipment checked  Epidural Patient position: sitting Prep: site prepped and draped and DuraPrep Patient monitoring: continuous pulse ox and blood pressure Approach: midline Injection technique: LOR air  Needle:  Needle type: Tuohy  Needle gauge: 17 G Needle length: 9 cm and 9 Needle insertion depth: 5 cm cm Catheter type: closed end flexible Catheter size: 19 Gauge Catheter at skin depth: 10 cm Test dose: negative and Other  Assessment Events: blood not aspirated, injection not painful, no injection resistance, negative IV test and no paresthesia  Additional Notes Patient identified. Risks and benefits discussed including failed block, incomplete  Pain control, post dural puncture headache, nerve damage, paralysis, blood pressure Changes, nausea, vomiting, reactions to medications-both toxic and allergic and post Partum back pain. All questions were answered. Patient expressed understanding and wished to proceed. Sterile technique was used throughout procedure. Epidural site was Dressed with sterile barrier dressing. No paresthesias, signs of intravascular injection Or signs of intrathecal spread were encountered.  Patient was more comfortable after the epidural was dosed. Please see RN's note for documentation of vital signs and FHR which are stable.

## 2012-09-07 NOTE — Progress Notes (Signed)
Tracy Flores is a 22 y.o. G1P0 at [redacted]w[redacted]d by ultrasound admitted for rupture of membranes  Subjective: Feeling contractions more, wants epidural now.   Objective: BP 125/63  Pulse 83  Temp(Src) 97.8 F (36.6 C) (Oral)  Resp 18  Ht 5\' 9"  (1.753 m)  Wt 87.091 kg (192 lb)  BMI 28.34 kg/m2  SpO2 98%  LMP 11/25/2011      FHT:  FHR: 120 bpm, variability: moderate,  accelerations:  Present,  decelerations:  Present occasional variable UC:   Difficult to visualize on monitor, q2-4 minutes by palpation by Gross RN SVE:   Dilation: 4 Effacement (%): 90 Station: -1 Exam by:: Renaldo Harrison, RN  Labs: Lab Results  Component Value Date   WBC 14.5* 09/07/2012   HGB 12.2 09/07/2012   HCT 35.7* 09/07/2012   MCV 86.7 09/07/2012   PLT 256 09/07/2012    Assessment / Plan: Spontaneous labor, progressing normally  Labor: Progressing normally Preeclampsia:  no signs or symptoms of toxicity Fetal Wellbeing:  Category II, still with reasuring variability and accels Pain Control:  Epidural I/D:  GBS negative Anticipated MOD:  NSVD  Kevin Fenton 09/07/2012, 2:32 PM  I saw and examined patient along with student and agree with above note.   FRAZIER,NATALIE 09/10/2012 3:55 PM

## 2012-09-07 NOTE — MAU Note (Signed)
Patient states she is having contractions every 5-7 minutes. Leaking a little clear fluid. Reports good fetal movement.

## 2012-09-08 ENCOUNTER — Encounter (HOSPITAL_COMMUNITY): Payer: Self-pay | Admitting: *Deleted

## 2012-09-08 DIAGNOSIS — A6000 Herpesviral infection of urogenital system, unspecified: Secondary | ICD-10-CM

## 2012-09-08 DIAGNOSIS — O98519 Other viral diseases complicating pregnancy, unspecified trimester: Secondary | ICD-10-CM

## 2012-09-08 MED ORDER — LANOLIN HYDROUS EX OINT: TOPICAL_OINTMENT | CUTANEOUS | Status: DC | PRN

## 2012-09-08 MED ORDER — DIBUCAINE 1 % RE OINT: 1.0000 "application " | TOPICAL_OINTMENT | RECTAL | Status: DC | PRN

## 2012-09-08 MED ORDER — OXYCODONE-ACETAMINOPHEN 5-325 MG PO TABS: 1.0000 | ORAL_TABLET | ORAL | Status: DC | PRN

## 2012-09-08 MED ORDER — ZOLPIDEM TARTRATE 5 MG PO TABS: 5.0000 mg | ORAL_TABLET | Freq: Every evening | ORAL | Status: DC | PRN

## 2012-09-08 MED ORDER — PRENATAL MULTIVITAMIN CH
1.0000 | ORAL_TABLET | Freq: Every day | ORAL | Status: DC
  Administered 2012-09-08 – 2012-09-10 (×3): 1 via ORAL
  Filled 2012-09-08 (×3): qty 1

## 2012-09-08 MED ORDER — DIPHENHYDRAMINE HCL 25 MG PO CAPS: 25.0000 mg | ORAL_CAPSULE | Freq: Four times a day (QID) | ORAL | Status: DC | PRN

## 2012-09-08 MED ORDER — IBUPROFEN 600 MG PO TABS
600.0000 mg | ORAL_TABLET | Freq: Four times a day (QID) | ORAL | Status: DC
  Administered 2012-09-08 – 2012-09-10 (×10): 600 mg via ORAL
  Filled 2012-09-08 (×8): qty 1

## 2012-09-08 MED ORDER — TETANUS-DIPHTH-ACELL PERTUSSIS 5-2.5-18.5 LF-MCG/0.5 IM SUSP
0.5000 mL | Freq: Once | INTRAMUSCULAR | Status: AC
  Administered 2012-09-10: 0.5 mL via INTRAMUSCULAR

## 2012-09-08 MED ORDER — ONDANSETRON HCL 4 MG/2ML IJ SOLN: 4.0000 mg | INTRAMUSCULAR | Status: DC | PRN

## 2012-09-08 MED ORDER — WITCH HAZEL-GLYCERIN EX PADS: 1.0000 "application " | MEDICATED_PAD | CUTANEOUS | Status: DC | PRN

## 2012-09-08 MED ORDER — SIMETHICONE 80 MG PO CHEW: 80.0000 mg | CHEWABLE_TABLET | ORAL | Status: DC | PRN

## 2012-09-08 MED ORDER — SENNOSIDES-DOCUSATE SODIUM 8.6-50 MG PO TABS
2.0000 | ORAL_TABLET | Freq: Every day | ORAL | Status: DC
  Administered 2012-09-08 – 2012-09-09 (×2): 2 via ORAL

## 2012-09-08 MED ORDER — BENZOCAINE-MENTHOL 20-0.5 % EX AERO
1.0000 "application " | INHALATION_SPRAY | CUTANEOUS | Status: DC | PRN
  Administered 2012-09-08: 1 via TOPICAL
  Filled 2012-09-08: qty 56

## 2012-09-08 MED ORDER — ONDANSETRON HCL 4 MG PO TABS: 4.0000 mg | ORAL_TABLET | ORAL | Status: DC | PRN
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
## 2012-09-08 NOTE — Progress Notes (Signed)
UR completed 

## 2012-09-08 NOTE — Anesthesia Postprocedure Evaluation (Signed)
  Anesthesia Post-op Note  Patient: Tracy Flores  Procedure(s) Performed: * No procedures listed *  Patient Location: Mother/Baby  Anesthesia Type:Epidural  Level of Consciousness: awake, alert , oriented and patient cooperative  Airway and Oxygen Therapy: Patient Spontanous Breathing  Post-op Pain: mild  Post-op Assessment: Patient's Cardiovascular Status Stable, Respiratory Function Stable, Patent Airway, No signs of Nausea or vomiting, Adequate PO intake and Pain level controlled  Post-op Vital Signs: Reviewed and stable  Complications: No apparent anesthesia complications

## 2012-09-09 LAB — CBC
HCT: 29.1 % — ABNORMAL LOW (ref 36.0–46.0)
Hemoglobin: 9.9 g/dL — ABNORMAL LOW (ref 12.0–15.0)
MCH: 29.8 pg (ref 26.0–34.0)
MCV: 87.7 fL (ref 78.0–100.0)
RBC: 3.32 MIL/uL — ABNORMAL LOW (ref 3.87–5.11)

## 2012-09-09 NOTE — Progress Notes (Signed)
Post Partum Day 1 Subjective: up ad lib, voiding, tolerating PO and + flatus. Reports that she is experiencing vaginal bleeding while she breast feeds. Reporting nipple tenderness while breastfeeding. Wants to stay one more night for assistance with breastfeeding.   Objective: Blood pressure 104/69, pulse 74, temperature 97.5 F (36.4 C), temperature source Oral, resp. rate 18, height 5\' 9"  (1.753 m), weight 87.091 kg (192 lb), last menstrual period 11/25/2011, SpO2 96.00%, unknown if currently breastfeeding.  Physical Exam:  General: alert, cooperative, appears stated age and no distress Lochia: appropriate Uterine Fundus: firm DVT Evaluation: No evidence of DVT seen on physical exam. Negative Homan's sign. No cords or calf tenderness.   Recent Labs  09/07/12 1010 09/09/12 0630  HGB 12.2 9.9*  HCT 35.7* 29.1*    Assessment/Plan: Plan for discharge tomorrow, Breastfeeding and Lactation consult   LOS: 2 days   Lorna Dibble 09/09/2012, 8:02 AM   Patient was seen this morning by me. Agree with above assessment and plan. Fundus: firm, NT D/C planning in am

## 2012-09-10 MED ORDER — IBUPROFEN 200 MG PO TABS: 200.0000 mg | ORAL_TABLET | Freq: Four times a day (QID) | ORAL | Status: DC | PRN

## 2012-09-10 NOTE — Discharge Summary (Signed)
Obstetric Discharge Summary Reason for Admission: onset of labor Prenatal Procedures: NST Intrapartum Procedures: spontaneous vaginal delivery and vacuum Postpartum Procedures: none Complications-Operative and Postpartum: 1 degree perineal laceration Hemoglobin  Date Value Range Status  09/09/2012 9.9* 12.0 - 15.0 g/dL Final     REPEATED TO VERIFY     DELTA CHECK NOTED  01/30/2012 15.3   Final     HCT  Date Value Range Status  09/09/2012 29.1* 36.0 - 46.0 % Final  01/30/2012 40   Final  Hospital Course At 4:22 AM a viable and healthy female was delivered via Vaginal, Vacuum Investment banker, operational). Presentation: vertex; Position: Occiput,, Anterior; Station: +2.  Verbal consent: obtained from patient. Risks and benefits discussed in detail. Risks include, but are not limited to the risks of anesthesia, bleeding, infection, damage to maternal tissues, fetal cephalhematoma. There is also the risk of inability to effect vaginal delivery of the head, or shoulder dystocia that cannot be resolved by established maneuvers, leading to the need for emergency cesarean section. All reviewed by Dr. Marice Potter.  APGAR: 8, 9 ; weight: pending.  Placenta status: Intact, Spontaneous.  Cord: 3 vessel   Has done well postpartum. Baby doing well.   Physical Exam:  General: alert, cooperative and appears stated age Lochia: appropriate Uterine Fundus: firm DVT Evaluation: No evidence of DVT seen on physical exam. No cords or calf tenderness.  Discharge Diagnoses: Term Pregnancy-delivered  Discharge Information: Date: 09/10/2012 Activity: pelvic rest Diet: routine Medications: Ibuprofen Condition: stable Instructions: refer to practice specific booklet Discharge to: home Follow-up Information   Follow up with [facility] In 6 weeks. (As needed)    Contact information:   [address] [place] [address] [phone]      Newborn Data: Live born female  Birth Weight: 6 lb 3.3 oz (2815 g) APGAR: 8,  9  Home with mother.  Gregor Hams 09/10/2012, 7:34 AM  Seen and examined. Agree with note. Wynelle Bourgeois CNM

## 2012-09-10 NOTE — Progress Notes (Signed)
Infant cluster feeding throughout night. When infant began to cry and exhibit feeding cues, mother when encouraged to breast feed became very tearful and stated " They are so sore ", Mother visibly apprehensive about feeding. Mother given option infant could be given small supplement and mother could rest until next feeding. Mother in agreement.

## 2012-09-10 NOTE — Progress Notes (Signed)
I saw and examined patient along with student and agree with above note.   FRAZIER,NATALIE 09/10/2012 3:53 PM

## 2012-09-16 ENCOUNTER — Telehealth (HOSPITAL_COMMUNITY): Payer: Self-pay | Admitting: *Deleted

## 2012-09-16 NOTE — Discharge Summary (Signed)
Attestation of Attending Supervision of Advanced Practitioner (CNM/NP): Evaluation and management procedures were performed by the Advanced Practitioner under my supervision and collaboration.  I have reviewed the Advanced Practitioner's note and chart, and I agree with the management and plan.  HARRAWAY-SMITH, [name] 9:43 AM

## 2012-09-16 NOTE — Telephone Encounter (Signed)
Resolve episode 

## 2012-10-20 ENCOUNTER — Encounter: Payer: Self-pay | Admitting: *Deleted

## 2012-10-21 ENCOUNTER — Encounter: Payer: Self-pay | Admitting: Obstetrics and Gynecology

## 2012-10-21 ENCOUNTER — Ambulatory Visit (INDEPENDENT_AMBULATORY_CARE_PROVIDER_SITE_OTHER): Payer: Medicaid Other | Admitting: Obstetrics and Gynecology

## 2012-10-21 NOTE — Patient Instructions (Signed)
Etonogestrel implant What is this medicine? ETONOGESTREL is a contraceptive (birth control) device. It is used to prevent pregnancy. It can be used for up to 3 years. This medicine may be used for other purposes; ask your health care provider or pharmacist if you have questions. What should I tell my health care provider before I take this medicine? They need to know if you have any of these conditions: -abnormal vaginal bleeding -blood vessel disease or blood clots -cancer of the breast, cervix, or liver -depression -diabetes -gallbladder disease -headaches -heart disease or recent heart attack -high blood pressure -high cholesterol -kidney disease -liver disease -renal disease -seizures -tobacco smoker -an unusual or allergic reaction to etonogestrel, other hormones, anesthetics or antiseptics, medicines, foods, dyes, or preservatives -pregnant or trying to get pregnant -breast-feeding How should I use this medicine? This device is inserted just under the skin on the inner side of your upper arm by a health care professional. Talk to your pediatrician regarding the use of this medicine in children. Special care may be needed. Overdosage: If you think you've taken too much of this medicine contact a poison control center or emergency room at once. Overdosage: If you think you have taken too much of this medicine contact a poison control center or emergency room at once. NOTE: This medicine is only for you. Do not share this medicine with others. What if I miss a dose? This does not apply. What may interact with this medicine? Do not take this medicine with any of the following medications: -amprenavir -bosentan -fosamprenavir This medicine may also interact with the following medications: -barbiturate medicines for inducing sleep or treating seizures -certain medicines for fungal infections like ketoconazole and itraconazole -griseofulvin -medicines to treat seizures like  carbamazepine, felbamate, oxcarbazepine, phenytoin, topiramate -modafinil -phenylbutazone -rifampin -some medicines to treat HIV infection like atazanavir, indinavir, lopinavir, nelfinavir, tipranavir, ritonavir -St. Yehudit Fulginiti's wort This list may not describe all possible interactions. Give your health care provider a list of all the medicines, herbs, non-prescription drugs, or dietary supplements you use. Also tell them if you smoke, drink alcohol, or use illegal drugs. Some items may interact with your medicine. What should I watch for while using this medicine? This product does not protect you against HIV infection (AIDS) or other sexually transmitted diseases. You should be able to feel the implant by pressing your fingertips over the skin where it was inserted. Tell your doctor if you cannot feel the implant. What side effects may I notice from receiving this medicine? Side effects that you should report to your doctor or health care professional as soon as possible: -allergic reactions like skin rash, itching or hives, swelling of the face, lips, or tongue -breast lumps -changes in vision -confusion, trouble speaking or understanding -dark urine -depressed mood -general ill feeling or flu-like symptoms -light-colored stools -loss of appetite, nausea -right upper belly pain -severe headaches -severe pain, swelling, or tenderness in the abdomen -shortness of breath, chest pain, swelling in a leg -signs of pregnancy -sudden numbness or weakness of the face, arm or leg -trouble walking, dizziness, loss of balance or coordination -unusual vaginal bleeding, discharge -unusually weak or tired -yellowing of the eyes or skin Side effects that usually do not require medical attention (Report these to your doctor or health care professional if they continue or are bothersome.): -acne -breast pain -changes in weight -cough -fever or chills -headache -irregular menstrual  bleeding -itching, burning, and vaginal discharge -pain or difficulty passing urine -sore throat This   list may not describe all possible side effects. Call your doctor for medical advice about side effects. You may report side effects to FDA at 1-800-FDA-1088. Where should I keep my medicine? This drug is given in a hospital or clinic and will not be stored at home. NOTE: This sheet is a summary. It may not cover all possible information. If you have questions about this medicine, talk to your doctor, pharmacist, or health care provider.  2013, [employer]. (03/10/2009 3:54:17 PM) Postpartum Care After Vaginal Delivery After you deliver your baby, you will stay in the hospital for 24 to 72 hours, unless there were problems with the labor or delivery, or you have medical problems. While you are in the hospital, you will receive help and instructions on how to care for yourself and your baby. Your doctor will order pain medicine, in case you need it. You will have a small amount of bleeding from your vagina and should change your sanitary pad frequently. Wash your hands thoroughly with soap and water for at least 20 seconds after changing pads and using the toilet. Let the nurses know if you begin to pass blood clots or your bleeding increases. Do not flush blood clots down the toilet before having the nurse look at them, to make sure there is no placental tissue with them. If you had an intravenous (IV), it will be removed within 24 hours, if there are no problems. The first time you get out of bed or take a shower, call the nurse to help you because you may get weak, lightheaded, or even faint. If you are breastfeeding, you may feel painful contractions of your uterus for a couple of weeks. This is normal. The contractions help your uterus get back to normal size. If you are not breastfeeding, wear a supportive bra and handle your breasts as little as possible until your milk has dried up.  Hormones should not be given to dry up the breasts, because they can cause blood clots. You will be given your normal diet, unless you have diabetes or other medical problems.  The nurses may put an ice pack on your episiotomy (surgically enlarged opening), if you have one, to reduce the pain and swelling. On rare occasions, you may not be able to urinate and the nurse will need to empty your bladder with a catheter. If you had a postpartum tubal ligation ("tying tubes," female sterilization), it should not make your stay in the hospital longer. You may have your baby in your room with you as much as you like, unless you or the baby has a problem. Use the bassinet (basket) for the baby when going to and from the nursery. Do not carry the baby. Do not leave the postpartum area. If the mother is Rh negative (lacks a protein on the red blood cells) and the baby is Rh positive, the mother should get a Rho-gam shot to prevent Rh problems with future pregnancies. You may be given written instructions for you and your baby, and necessary medicines, when you are discharged from the hospital. Be sure you understand and follow the instructions as advised. HOME CARE INSTRUCTIONS   Follow instructions and take the medicines given to you.  Only take over-the-counter or prescription medicines for pain, discomfort, or fever as directed by your caregiver.  Do not take aspirin, because it can cause bleeding.  Increase your activities a little bit every day to build up your strength and endurance.  Do not drink  alcohol, especially if you are breastfeeding or taking pain medicine.  Take your temperature twice a day and record it.  You may have a small amount of bleeding or spotting for 2 to 4 weeks. This is normal.  Do not use tampons or douche. Use sanitary pads.  Try to have someone stay and help you for a few days when you go home.  Try to rest or take a nap when the baby is sleeping.  If you are  breastfeeding, wear a good support bra. If you are not breastfeeding, wear a supportive bra and do not stimulate your nipples.  Eat a healthy, nutritious diet and continue to take your prenatal vitamins.  Do not drive, do any heavy activities, or travel until your caregiver tells you it is okay.  Do not have intercourse until your caregiver gives you permission to do so.  Ask your caregiver when you can begin to exercise and what type of exercises to do.  Call your caregiver if you think you are having a problem from your delivery.  Call your pediatrician if you are having a problem with the baby.  Schedule your postpartum visit and keep it. SEEK MEDICAL CARE IF:   You have a temperature of 100 F (37.8 C) or higher.  You have increased vaginal bleeding or are passing clots. Save any clots to show your caregiver.  You have bloody urine or pain when you urinate.  You have a bad smelling vaginal discharge.  You have increasing pain or swelling on your episiotomy.  You develop a severe headache.  You feel depressed.  The episiotomy is separating.  You become dizzy or lightheaded.  You develop a rash.  You have a reaction or problems with your medicine.  You have pain, redness, or swelling at the intravenous site. SEEK IMMEDIATE MEDICAL CARE IF:   You have chest pain.  You develop shortness of breath.  You pass out.  You develop pain, with or without swelling or redness in your leg.  You develop heavy vaginal bleeding, with or without blood clots.  You develop stomach pain.  You develop a bad smelling vaginal discharge. MAKE SURE YOU:   Understand these instructions.  Will watch your condition.  Will get help right away if you are not doing well or get worse. Document Released: 04/14/2007 Document Revised: 09/09/2011 Document Reviewed: 04/26/2009 [facility] Patient Information 2013 [place], [place].

## 2012-10-21 NOTE — Progress Notes (Signed)
Patient ID: Tracy Flores, female   DOB: December 17, 1990, 22 y.o.   MRN: [id number]  Assessment:  1. Normal postpartum exam.  2.   Plan:  1. Nexplanon  Subjective:  Tracy Flores is a 22 y.o. female who presents for a postpartum visit.  I have fully reviewed the prenatal and intrapartum course.    Patient is not sexually active.   The following portions of the patient's history were reviewed and updated as appropriate: allergies, current medications, past family history, past medical history, past surgical history and problem list.  Review of Systems See Subjective, otherwise negative ROS.  Objective:  BP 132/72  Wt 180 lb 12.8 oz (82.01 kg)  BMI 26.69 kg/m2  Breastfeeding? No  General:  alert, cooperative and no distress     Lungs: clear to auscultation bilaterally  Heart:  regular rate and rhythm, S1, S2 normal, no murmur  Abdomen: soft, non-tender; bowel sounds normal; no masses,  no organomegaly   Vulva:  normal  Vagina: normal vagina  Cervix:  normal  Corpus: normal size, contour, position, consistency, mobility, non-tender  Adnexa:  normal adnexa  Well healed perineum

## 2012-10-27 ENCOUNTER — Ambulatory Visit (INDEPENDENT_AMBULATORY_CARE_PROVIDER_SITE_OTHER): Payer: Medicaid Other | Admitting: Advanced Practice Midwife

## 2012-10-27 ENCOUNTER — Encounter: Payer: Self-pay | Admitting: Advanced Practice Midwife

## 2012-10-27 VITALS — BP 110/78 | Ht 67.5 in | Wt 181.5 lb

## 2012-10-27 DIAGNOSIS — Z309 Encounter for contraceptive management, unspecified: Secondary | ICD-10-CM

## 2012-10-27 DIAGNOSIS — Z30017 Encounter for initial prescription of implantable subdermal contraceptive: Secondary | ICD-10-CM

## 2012-10-27 DIAGNOSIS — Z3202 Encounter for pregnancy test, result negative: Secondary | ICD-10-CM

## 2012-10-27 MED ORDER — ETONOGESTREL 68 MG ~~LOC~~ IMPL: 68.0000 mg | DRUG_IMPLANT | Freq: Once | SUBCUTANEOUS | Status: DC

## 2012-10-27 NOTE — Progress Notes (Signed)
Tracy Flores is a 22 y.o. year old Caucasian female here for Nexplanon insertion.  Her LMP was 10/18/12 , and her pregnancy test today was negative.  Risks/benefits/side effects of Nexplanon have been discussed and her questions have been answered.  Specifically, a failure rate of 07/998 has been reported, with an increased failure rate if pt takes St. John's Wort and/or antiseizure medicaitons.  Tracy Flores is aware of the common side effect of irregular bleeding, which the incidence of decreases over time.  Her left arm, approximatly 4 inches proximal from the elbow, was cleansed with alcohol and anesthetized with 2cc of 2% Lidocaine.  The area was cleansed again and the Nexplanon was inserted without difficulty.  A pressure bandage was applied.  Pt was instructed to remove pressure bandage in a few hours, and keep insertion site covered with a bandaid for 3 days.  Back up contraception was recommended for 2 weeks.  Follow-up scheduled PRN problems  CRESENZO-DISHMAN,Rheda Kassab 10/27/2012 10:15 AM

## 2012-10-27 NOTE — Patient Instructions (Signed)

## 2012-11-11 ENCOUNTER — Encounter: Payer: Self-pay | Admitting: *Deleted

## 2012-11-12 ENCOUNTER — Ambulatory Visit (INDEPENDENT_AMBULATORY_CARE_PROVIDER_SITE_OTHER): Payer: Medicaid Other | Admitting: Nurse Practitioner

## 2012-11-12 ENCOUNTER — Encounter: Payer: Self-pay | Admitting: Nurse Practitioner

## 2012-11-12 VITALS — BP 116/72 | HR 90 | Wt 184.0 lb

## 2012-11-12 DIAGNOSIS — F329 Major depressive disorder, single episode, unspecified: Secondary | ICD-10-CM

## 2012-11-12 DIAGNOSIS — A6000 Herpesviral infection of urogenital system, unspecified: Secondary | ICD-10-CM

## 2012-11-12 DIAGNOSIS — F429 Obsessive-compulsive disorder, unspecified: Secondary | ICD-10-CM

## 2012-11-12 DIAGNOSIS — F411 Generalized anxiety disorder: Secondary | ICD-10-CM

## 2012-11-12 MED ORDER — VALACYCLOVIR HCL 1 G PO TABS: 1000.0000 mg | ORAL_TABLET | Freq: Every day | ORAL | Status: DC

## 2012-11-12 MED ORDER — PAXIL CR 25 MG PO TB24: 25.0000 mg | ORAL_TABLET | Freq: Every day | ORAL | Status: DC

## 2012-11-13 DIAGNOSIS — A6000 Herpesviral infection of urogenital system, unspecified: Secondary | ICD-10-CM | POA: Insufficient documentation

## 2012-11-13 DIAGNOSIS — F429 Obsessive-compulsive disorder, unspecified: Secondary | ICD-10-CM | POA: Insufficient documentation

## 2012-11-13 DIAGNOSIS — F411 Generalized anxiety disorder: Secondary | ICD-10-CM | POA: Insufficient documentation

## 2012-11-13 DIAGNOSIS — F329 Major depressive disorder, single episode, unspecified: Secondary | ICD-10-CM | POA: Insufficient documentation

## 2012-11-13 NOTE — Assessment & Plan Note (Signed)
Restart Paxil CR 25 mg daily. Recommend mental health counseling.

## 2012-11-13 NOTE — Assessment & Plan Note (Signed)
Restart Paxil CR 25 mg daily. 

## 2012-11-13 NOTE — Assessment & Plan Note (Signed)
Restart Paxil CR 25 mg daily.

## 2012-11-13 NOTE — Progress Notes (Signed)
Subjective:  Presents for recheck. Was diagnosed with genital herpes several months ago. Would like to continue daily suppressive therapy. No current outbreaks. Same sexual partner. Has an infant at home. Did experience some postpartum depression. Has noticed a flareup of her depression and anxiety, some flareup of her OCD. Denies suicidal/homicidal thoughts or ideation. Would like to restart her Paxil which has seemed to work the best.  Objective:   BP 116/72  Pulse 90  Wt 184 lb (83.462 kg)  BMI 28.38 kg/m2  LMP 10/18/2012 NAD. Alert, oriented. Thoughts logical coherent and relevant. Lungs clear. Heart regular rate rhythm.  Assessment:Depression  Generalized anxiety disorder  OCD (obsessive compulsive disorder)  Genital herpes  Plan: Meds ordered this encounter  Medications  . PAXIL CR 25 MG 24 hr tablet    Sig: Take 1 tablet (25 mg total) by mouth daily.    Dispense:  30 tablet    Refill:  2    Order Specific Question:  Supervising Provider    Answer:  Merlyn Albert [[id number]]  . valACYclovir (VALTREX) 1000 MG tablet    Sig: Take 1 tablet (1,000 mg total) by mouth daily.    Dispense:  30 tablet    Refill:  5    Order Specific Question:  Supervising Provider    Answer:  Merlyn Albert [[id number]]  Recheck in 3 months, call back sooner if any problems. Discussed mental health counseling, given information. Marland Kitchen

## 2012-11-13 NOTE — Assessment & Plan Note (Signed)
Valtrex 1g by mouth daily for suppression

## 2013-03-25 ENCOUNTER — Encounter: Payer: Self-pay | Admitting: Nurse Practitioner

## 2013-03-25 ENCOUNTER — Ambulatory Visit (INDEPENDENT_AMBULATORY_CARE_PROVIDER_SITE_OTHER): Payer: Medicaid Other | Admitting: Nurse Practitioner

## 2013-03-25 VITALS — BP 132/78 | Ht 67.5 in | Wt 179.0 lb

## 2013-03-25 DIAGNOSIS — F329 Major depressive disorder, single episode, unspecified: Secondary | ICD-10-CM

## 2013-03-25 DIAGNOSIS — M6740 Ganglion, unspecified site: Secondary | ICD-10-CM

## 2013-03-25 DIAGNOSIS — F411 Generalized anxiety disorder: Secondary | ICD-10-CM

## 2013-03-25 DIAGNOSIS — M67432 Ganglion, left wrist: Secondary | ICD-10-CM

## 2013-03-25 DIAGNOSIS — F988 Other specified behavioral and emotional disorders with onset usually occurring in childhood and adolescence: Secondary | ICD-10-CM

## 2013-03-25 DIAGNOSIS — Z79899 Other long term (current) drug therapy: Secondary | ICD-10-CM

## 2013-03-25 MED ORDER — VALACYCLOVIR HCL 1 G PO TABS: 1000.0000 mg | ORAL_TABLET | Freq: Every day | ORAL | Status: DC

## 2013-03-25 MED ORDER — AMPHETAMINE-DEXTROAMPHET ER 10 MG PO CP24: 10.0000 mg | ORAL_CAPSULE | ORAL | Status: DC

## 2013-03-25 MED ORDER — PAROXETINE HCL ER 25 MG PO TB24: 25.0000 mg | ORAL_TABLET | Freq: Every day | ORAL | Status: DC

## 2013-03-26 ENCOUNTER — Encounter: Payer: Self-pay | Admitting: Nurse Practitioner

## 2013-03-26 DIAGNOSIS — F988 Other specified behavioral and emotional disorders with onset usually occurring in childhood and adolescence: Secondary | ICD-10-CM | POA: Insufficient documentation

## 2013-03-26 NOTE — Progress Notes (Signed)
Subjective:  Presents for recheck. Depression much better on Paxil. Sleeping well. Rare panic attack (maybe twice per month). Has Implanon for birth control. Difficulty focusing and completing tasks. Progressively worse. Started around 22 years old. Always struggled in school.  Also c/o "knot" on left wrist for several months. Comes and goes. Slightly tender at times.  Objective:   BP 132/78  Ht 5' 7.5" (1.715 m)  Wt 179 lb (81.194 kg)  BMI 27.61 kg/m2 NAD. Alert, Oriented. Cheerful affect. Lungs clear. Heart RRR. Firm mobile nodule noted volar radial side left wrist. Minimal tenderness. High score on ASRS for ADD. EKG normal.  Diagnosis:ADD (attention deficit disorder)  High risk medication use - Plan: PR ELECTROCARDIOGRAM, COMPLETE  Ganglion cyst of wrist, left  Depression  Generalized anxiety disorder  Plan:  Meds ordered this encounter  Medications  . DISCONTD: amphetamine-dextroamphetamine (ADDERALL XR) 10 MG 24 hr capsule    Sig: Take 1 capsule (10 mg total) by mouth every morning.    Dispense:  30 capsule    Refill:  0    Order Specific Question:  Supervising Provider    Answer:  Merlyn Albert [[id number]]  . valACYclovir (VALTREX) 1000 MG tablet    Sig: Take 1 tablet (1,000 mg total) by mouth daily.    Dispense:  30 tablet    Refill:  5    Order Specific Question:  Supervising Provider    Answer:  Merlyn Albert [[id number]]  . PARoxetine (PAXIL CR) 25 MG 24 hr tablet    Sig: Take 1 tablet (25 mg total) by mouth daily.    Dispense:  30 tablet    Refill:  2    Order Specific Question:  Supervising Provider    Answer:  Merlyn Albert [[id number]]  . amphetamine-dextroamphetamine (ADDERALL XR) 10 MG 24 hr capsule    Sig: Take 10 mg by mouth every morning.   Recheck in one month, d/c med and call sooner if any problems.

## 2013-03-26 NOTE — Assessment & Plan Note (Signed)
.   valACYclovir (VALTREX) 1000 MG tablet    Sig: Take 1 tablet (1,000 mg total) by mouth daily.    Dispense:  30 tablet    Refill:  5    Order Specific Question:  Supervising Provider    Answer:  LUKING, WILLIAM S [[id number]]  . PARoxetine (PAXIL CR) 25 MG 24 hr tablet    Sig: Take 1 tablet (25 mg total) by mouth daily.    Dispense:  30 tablet    Refill:  2    Order Specific Question:  Supervising Provider    Answer:  LUKING, WILLIAM S [[id number]]  . amphetamine-dextroamphetamine (ADDERALL XR) 10 MG 24 hr capsule    Sig: Take 10 mg by mouth every morning.   Recheck in one month, d/c med and call sooner if any problems. 

## 2013-03-26 NOTE — Assessment & Plan Note (Signed)
.   valACYclovir (VALTREX) 1000 MG tablet    Sig: Take 1 tablet (1,000 mg total) by mouth daily.    Dispense:  30 tablet    Refill:  5    Order Specific Question:  Supervising Provider    Answer:  Merlyn Albert [[id number]]  . PARoxetine (PAXIL CR) 25 MG 24 hr tablet    Sig: Take 1 tablet (25 mg total) by mouth daily.    Dispense:  30 tablet    Refill:  2    Order Specific Question:  Supervising Provider    Answer:  Merlyn Albert [[id number]]  . amphetamine-dextroamphetamine (ADDERALL XR) 10 MG 24 hr capsule    Sig: Take 10 mg by mouth every morning.   Recheck in one month, d/c med and call sooner if any problems.

## 2013-04-13 ENCOUNTER — Encounter: Payer: Self-pay | Admitting: Family Medicine

## 2013-04-26 ENCOUNTER — Ambulatory Visit: Payer: Medicaid Other | Admitting: Nurse Practitioner

## 2013-10-25 ENCOUNTER — Encounter (HOSPITAL_COMMUNITY): Payer: Self-pay | Admitting: Psychiatry

## 2013-10-25 ENCOUNTER — Ambulatory Visit (INDEPENDENT_AMBULATORY_CARE_PROVIDER_SITE_OTHER): Payer: 59 | Admitting: Psychiatry

## 2013-10-25 VITALS — BP 110/68 | Ht 67.0 in | Wt 169.0 lb

## 2013-10-25 DIAGNOSIS — F429 Obsessive-compulsive disorder, unspecified: Secondary | ICD-10-CM

## 2013-10-25 DIAGNOSIS — F329 Major depressive disorder, single episode, unspecified: Secondary | ICD-10-CM

## 2013-10-25 DIAGNOSIS — F32A Depression, unspecified: Secondary | ICD-10-CM

## 2013-10-25 DIAGNOSIS — F332 Major depressive disorder, recurrent severe without psychotic features: Secondary | ICD-10-CM

## 2013-10-25 DIAGNOSIS — F411 Generalized anxiety disorder: Secondary | ICD-10-CM

## 2013-10-25 MED ORDER — PAROXETINE HCL 40 MG PO TABS: 40.0000 mg | ORAL_TABLET | Freq: Every day | ORAL | Status: DC

## 2013-10-25 MED ORDER — ALPRAZOLAM 0.5 MG PO TABS: 0.5000 mg | ORAL_TABLET | Freq: Two times a day (BID) | ORAL | Status: DC

## 2013-10-25 NOTE — Progress Notes (Signed)
Psychiatric Assessment Adult  Patient Identification:  Tracy Flores Date of Evaluation:  10/25/2013 Chief Complaint: "I stay nervous and worried all the time." History of Chief Complaint:   Chief Complaint  Patient presents with  . Anxiety  . Depression  . Establish Care    Anxiety Symptoms include nervous/anxious behavior.     this patient is a 23 year old single white female who lives her boyfriend and 838-month-old son in [place]. She works Education officer, environmentalcleaning houses. She is referred by Dr.-Luking's office for assessment of depression and anxiety  The patient states that her difficulties with depression and panic attacks started at age 23. She felt like she was being bullied at school and had a hard time going to school. Sometimes she was too depressed to go out of bed. She was seeing a Veterinary surgeoncounselor and a psychiatrist in [place] and was tried on numerous medications. She's had negative reactions to several such as Zoloft not working Effexor making her worse Lamictal not working and Abilify causing restless legs.  In 2012 she had a particularly difficult year. She was hospitalized 3 times, once at the [facility] and twice at old [facility]. Prior to her last hospitalization she had made a suicide attempt by trying to crash up her car. She did have a good response to Paxil and Xanax but stopped the medications when she became pregnant last year. Since having her baby she's been depressed again and the Paxil was restarted.  This helped for a while but lately she's been more depressed and not finding much joy in things. She used to be on a higher dose of Paxil. She is sleeping fairly well but her energy is low she is tired all the time. She does spend time with her sister and friends but is very uncomfortable going out in public around crowds or attending community college. She has panic attacks about once a week. She denies being suicidal and claims that this stopped when she had  a child and was responsible for another person. She's not using drugs or alcohol. She's never had psychotic symptoms or manic symptoms. However she tends to worry and ruminate about the same things over and over and is constantly cleaning her house. Review of Systems  Constitutional: Positive for fatigue.  HENT: Negative.   Eyes: Negative.   Respiratory: Negative.   Cardiovascular: Negative.   Gastrointestinal: Negative.   Endocrine: Negative.   Genitourinary: Negative.   Musculoskeletal: Negative.   Allergic/Immunologic: Negative.   Neurological: Negative.   Hematological: Negative.   Psychiatric/Behavioral: Positive for dysphoric mood. The patient is nervous/anxious.    Physical Exam not done  Depressive Symptoms: depressed mood, anhedonia, psychomotor retardation, fatigue, difficulty concentrating, anxiety, panic attacks,  (Hypo) Manic Symptoms:   Elevated Mood:  No Irritable Mood:  Yes Grandiosity:  No Distractibility:  Yes Labiality of Mood:  No Delusions:  No Hallucinations:  No Impulsivity:  No Sexually Inappropriate Behavior:  No Financial Extravagance:  No Flight of Ideas:  No  Anxiety Symptoms: Excessive Worry:  Yes Panic Symptoms:  Yes Agoraphobia:  No Obsessive Compulsive: Yes  Symptoms: Cleans house compulsively Specific Phobias:  No Social Anxiety:  Yes  Psychotic Symptoms:  Hallucinations: No None Delusions:  No Paranoia:  No   Ideas of Reference:  No  PTSD Symptoms: Ever had a traumatic exposure:  No Had a traumatic exposure in the last month:  No Re-experiencing: No None Hypervigilance:  No Hyperarousal: No None Avoidance: No None  Traumatic Brain Injury: No  Past Psychiatric History: Diagnosis: Major depression, panic disorder, OCD   Hospitalizations: 3 times in 2012   Outpatient Care: Has seen a therapist in her teen years   Substance Abuse Care: none  Self-Mutilation: none  Suicidal Attempts: Once by trying to Crash her car    Violent Behaviors: none   Past Medical History:   Past Medical History  Diagnosis Date  . IBS (irritable bowel syndrome)   . Acid reflux disease   . Major depression   . OCD (obsessive compulsive disorder)   . Anxiety   . Interstitial cystitis   . Pregnancy   . 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 Abnormal pap   . History of gonorrhea 2013  . Reflux   . Reactive airway disease   . URI (upper respiratory infection)    History of Loss of Consciousness:  No Seizure History:  No Cardiac History:  No Allergies:   Allergies  Allergen Reactions  . Abilify [Aripiprazole]     Restless legs  . Celexa [Citalopram Hydrobromide]     Agitation   . Penicillins Other (See Comments)    Unknown Childhood reaction  . Zoloft [Sertraline Hcl]     Suicidal thoughts  . Amoxil [Amoxicillin] Rash   Current Medications:  Current Outpatient Prescriptions  Medication Sig Dispense Refill  . ALPRAZolam (XANAX) 0.5 MG tablet Take 1 tablet (0.5 mg total) by mouth 2 (two) times daily.  60 tablet  2  . amphetamine-dextroamphetamine (ADDERALL XR) 10 MG 24 hr capsule Take 10 mg by mouth every morning.      Tracy Flores. PARoxetine (PAXIL) 40 MG tablet Take 1 tablet (40 mg total) by mouth at bedtime.  30 tablet  2  . valACYclovir (VALTREX) 1000 MG tablet Take 1 tablet (1,000 mg total) by mouth daily.  30 tablet  5   Current Facility-Administered Medications  Medication Dose Route Frequency Provider Last Rate Last Dose  . etonogestrel (IMPLANON) implant 68 mg  68 mg Subcutaneous Once Jacklyn ShellFrances Cresenzo-Dishmon, CNM        Previous Psychotropic Medications:  Medication Dose   Zoloft, Effexor, Lamictal, Abilify, Xanax                        Substance Abuse History in the last 12 months: Substance Age of 1st Use Last Use Amount Specific Type  Nicotine    smokes one half pack per day    Alcohol      Cannabis      Opiates      Cocaine      Methamphetamines      LSD      Ecstasy       Benzodiazepines      Caffeine      Inhalants      Others:                          Medical Consequences of Substance Abuse: n/a  Legal Consequences of Substance Abuse: n/a  Family Consequences of Substance Abuse: n/a  Blackouts:  No DT's:  No Withdrawal Symptoms:  No None  Social History: Current Place [place] Residence: [place] [address] [place] Place of Birth: [place] Family Members: Parents, 2 sisters, boyfriend, son Marital Status:  Single Children:   Sons: 1  Daughters:  Relationships: Lives with boyfriend Education:  GED Educational Problems/Performance: Became too anxious and depressed to attend high school Religious Beliefs/Practices: Unknown History of Abuse: none Occupational  Experiences; cleaning houses Military History:  None. Legal History: none Hobbies/Interests: Walking with baby, reading  Family History:   Family History  Problem Relation Age of Onset  . Heart disease    . Arthritis    . Cancer    . Diabetes    . Hypertension    . Depression Mother   . Bipolar disorder Sister   . Drug abuse Paternal Aunt     Mental Status Examination/Evaluation: Objective:  Appearance: Casual and Well Groomed  Eye Contact::  Good  Speech:  Slow  Volume:  Decreased  Mood:  Anxious, reticent, mildly depressed   Affect:  Constricted  Thought Process:  Goal Directed  Orientation:  Full (Time, Place, and Person)  Thought Content:  Obsessions and Rumination  Suicidal Thoughts:  No  Homicidal Thoughts:  No  Judgement:  Good  Insight:  Good  Psychomotor Activity:  Decreased  Akathisia:  No  Handed:  Right  AIMS (if indicated):    Assets:  Communication Skills Desire for Improvement Social Support    Laboratory/X-Ray Psychological Evaluation(s)   Reviewed in record      Assessment:  Axis I: Generalized Anxiety Disorder, Major Depression, Recurrent severe and Obsessive Compulsive Disorder  AXIS I Generalized Anxiety Disorder, Major  Depression, Recurrent severe and Obsessive Compulsive Disorder  AXIS II Deferred  AXIS III Past Medical History  Diagnosis Date  . IBS (irritable bowel syndrome)   . Acid reflux disease   . Major depression   . OCD (obsessive compulsive disorder)   . Anxiety   . Interstitial cystitis   . Pregnancy   . 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 Abnormal pap   . History of gonorrhea 2013  . Reflux   . Reactive airway disease   . URI (upper respiratory infection)      AXIS IV other psychosocial or environmental problems  AXIS V 51-60 moderate symptoms   Treatment Plan/Recommendations:  Plan of Care: Medication management   Laboratory:   Psychotherapy: She'll be assigned a therapist here   Medications: Since she did better on a higher dose of Paxil in the past. She'll discontinue Paxil CR 25 mg each bedtime and start Paxil 40 mg each bedtime. She'll also start Xanax 0.5 mg twice a day   Routine PRN Medications:  No  Consultations:   Safety Concerns:  She denies thoughts of self-harm   Other:  She will return in four-weeks    [place], Gavin Pound, MD 4/27/201511:11 AM

## 2013-11-09 ENCOUNTER — Ambulatory Visit (HOSPITAL_COMMUNITY): Payer: Self-pay | Admitting: Psychology

## 2013-11-10 ENCOUNTER — Ambulatory Visit (INDEPENDENT_AMBULATORY_CARE_PROVIDER_SITE_OTHER): Payer: Medicaid Other | Admitting: Family Medicine

## 2013-11-10 ENCOUNTER — Encounter: Payer: Self-pay | Admitting: Family Medicine

## 2013-11-10 VITALS — BP 134/70 | Temp 98.2°F | Ht 67.0 in | Wt 169.0 lb

## 2013-11-10 DIAGNOSIS — R21 Rash and other nonspecific skin eruption: Secondary | ICD-10-CM

## 2013-11-10 MED ORDER — PREDNISONE 10 MG PO TABS: ORAL_TABLET | ORAL | Status: DC

## 2013-11-10 NOTE — Progress Notes (Signed)
   Subjective:    Patient ID: Tracy Flores, female    DOB: 07/05/1990, 23 y.o.   MRN: [id number]  Rash This is a new problem. The current episode started yesterday. Pain location: all over. The rash is characterized by itchiness, redness, burning and dryness. She was exposed to a new medication (clindamycin, hydrocodone). Past treatments include anti-itch cream. The treatment provided no relief.    Dr brown  Rash yest  Itches a little and then causes discomfort  Clindamycin which finished yestder  Getting back to reg eating and drinking Going back to old dentist on tuesday  Review of Systems  Skin: Positive for rash.   no chest pain no headache no fever ROS otherwise negative     Objective:   Physical Exam  Alert mild malaise. Diffuse maculopapular rash on arms flank bilateral clearly pruritic. Lungs clear heart regular rate and rhythm. Resulting dental surgery site no lymphadenitis      Assessment & Plan:  Impression clindamycin allergy plan prednisone taper. Followup with Maurine Ministerennis. Seen in after-hours rather than being sent to emergency room.

## 2013-11-16 ENCOUNTER — Ambulatory Visit (INDEPENDENT_AMBULATORY_CARE_PROVIDER_SITE_OTHER): Payer: 59 | Admitting: Psychology

## 2013-11-16 DIAGNOSIS — F3289 Other specified depressive episodes: Secondary | ICD-10-CM

## 2013-11-16 DIAGNOSIS — F32A Depression, unspecified: Secondary | ICD-10-CM

## 2013-11-16 DIAGNOSIS — F329 Major depressive disorder, single episode, unspecified: Secondary | ICD-10-CM

## 2013-11-23 ENCOUNTER — Ambulatory Visit (HOSPITAL_COMMUNITY): Payer: Self-pay | Admitting: Psychiatry

## 2013-11-24 ENCOUNTER — Ambulatory Visit (INDEPENDENT_AMBULATORY_CARE_PROVIDER_SITE_OTHER): Payer: 59 | Admitting: Psychiatry

## 2013-11-24 ENCOUNTER — Encounter (HOSPITAL_COMMUNITY): Payer: Self-pay | Admitting: Psychiatry

## 2013-11-24 VITALS — BP 120/60 | Ht 67.0 in | Wt 164.0 lb

## 2013-11-24 DIAGNOSIS — F411 Generalized anxiety disorder: Secondary | ICD-10-CM

## 2013-11-24 DIAGNOSIS — F429 Obsessive-compulsive disorder, unspecified: Secondary | ICD-10-CM

## 2013-11-24 DIAGNOSIS — F329 Major depressive disorder, single episode, unspecified: Secondary | ICD-10-CM

## 2013-11-24 DIAGNOSIS — F332 Major depressive disorder, recurrent severe without psychotic features: Secondary | ICD-10-CM

## 2013-11-24 DIAGNOSIS — F32A Depression, unspecified: Secondary | ICD-10-CM

## 2013-11-24 MED ORDER — PAROXETINE HCL 40 MG PO TABS: 40.0000 mg | ORAL_TABLET | Freq: Every day | ORAL | Status: DC

## 2013-11-24 NOTE — Progress Notes (Signed)
Patient ID: Tracy Flores, female   DOB: 06-Aug-1990, 23 y.o.   MRN: [id number]  Psychiatric Assessment Adult  Patient Identification:  Tracy Flores Date of Evaluation:  11/24/2013 Chief Complaint: "I'm doing better." History of Chief Complaint:   Chief Complaint  Patient presents with  . Anxiety  . Depression  . Follow-up    Anxiety Symptoms include nervous/anxious behavior.     this patient is a 45 year old single white female who lives her boyfriend and 76-month-old son in [place]. She works Education officer, environmental houses. She is referred by Dr.-Luking's office for assessment of depression and anxiety  The patient states that her difficulties with depression and panic attacks started at age 32. She felt like she was being bullied at school and had a hard time going to school. Sometimes she was too depressed to go out of bed. She was seeing a Veterinary surgeon and a psychiatrist in [place] and was tried on numerous medications. She's had negative reactions to several such as Zoloft not working Effexor making her worse Lamictal not working and Abilify causing restless legs.  In 2012 she had a particularly difficult year. She was hospitalized 3 times, once at the [facility] and twice at [facility]. Prior to her last hospitalization she had made a suicide attempt by trying to crash up her car. She did have a good response to Paxil and Xanax but stopped the medications when she became pregnant last year. Since having her baby she's been depressed again and the Paxil was restarted.  This helped for a while but lately she's been more depressed and not finding much joy in things. She used to be on a higher dose of Paxil. She is sleeping fairly well but her energy is low she is tired all the time. She does spend time with her sister and friends but is very uncomfortable going out in public around crowds or attending community college. She has panic attacks about once a week. She denies being  suicidal and claims that this stopped when she had a child and was responsible for another person. She's not using drugs or alcohol. She's never had psychotic symptoms or manic symptoms. However she tends to worry and ruminate about the same things over and over and is constantly cleaning her house.  The patient returns after four-week . she is now on Paxil 40 mg each bedtime. She was given Xanax but only uses it very occasionally. Her mood is steadily improved and she is less anxious. She is getting out more with her family. She's no longer is anxious in crowds and went to the [employer] and a Park in [place] over the weekend. She sleeping well and her energy has improved. She denies suicidal ideation Review of Systems  Constitutional: Positive for fatigue.  HENT: Negative.   Eyes: Negative.   Respiratory: Negative.   Cardiovascular: Negative.   Gastrointestinal: Negative.   Endocrine: Negative.   Genitourinary: Negative.   Musculoskeletal: Negative.   Allergic/Immunologic: Negative.   Neurological: Negative.   Hematological: Negative.   Psychiatric/Behavioral: Positive for dysphoric mood. The patient is nervous/anxious.    Physical Exam not done  Depressive Symptoms: depressed mood, anhedonia, psychomotor retardation, fatigue, difficulty concentrating, anxiety, panic attacks,  (Hypo) Manic Symptoms:   Elevated Mood:  No Irritable Mood:  Yes Grandiosity:  No Distractibility:  Yes Labiality of Mood:  No Delusions:  No Hallucinations:  No Impulsivity:  No Sexually Inappropriate Behavior:  No Financial Extravagance:  No Flight of Ideas:  No  Anxiety Symptoms: Excessive Worry:  Yes Panic Symptoms:  Yes Agoraphobia:  No Obsessive Compulsive: Yes  Symptoms: Cleans house compulsively Specific Phobias:  No Social Anxiety:  Yes  Psychotic Symptoms:  Hallucinations: No None Delusions:  No Paranoia:  No   Ideas of Reference:  No  PTSD Symptoms: Ever had a traumatic  exposure:  No Had a traumatic exposure in the last month:  No Re-experiencing: No None Hypervigilance:  No Hyperarousal: No None Avoidance: No None  Traumatic Brain Injury: No   Past Psychiatric History: Diagnosis: Major depression, panic disorder, OCD   Hospitalizations: 3 times in 2012   Outpatient Care: Has seen a therapist in her teen years   Substance Abuse Care: none  Self-Mutilation: none  Suicidal Attempts: Once by trying to Crash her car   Violent Behaviors: none   Past Medical History:   Past Medical History  Diagnosis Date  . IBS (irritable bowel syndrome)   . Acid reflux disease   . Major depression   . OCD (obsessive compulsive disorder)   . Anxiety   . Interstitial cystitis   . Pregnancy   . 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 Abnormal pap   . History of gonorrhea 2013  . Reflux   . Reactive airway disease   . URI (upper respiratory infection)    History of Loss of Consciousness:  No Seizure History:  No Cardiac History:  No Allergies:   Allergies  Allergen Reactions  . Abilify [Aripiprazole]     Restless legs  . Celexa [Citalopram Hydrobromide]     Agitation   . Clindamycin/Lincomycin   . Penicillins Other (See Comments)    Unknown Childhood reaction  . Zoloft [Sertraline Hcl]     Suicidal thoughts  . Amoxil [Amoxicillin] Rash   Current Medications:  Current Outpatient Prescriptions  Medication Sig Dispense Refill  . ALPRAZolam (XANAX) 0.5 MG tablet Take 1 tablet (0.5 mg total) by mouth 2 (two) times daily.  60 tablet  2  . PARoxetine (PAXIL) 40 MG tablet Take 1 tablet (40 mg total) by mouth at bedtime.  30 tablet  2  . predniSONE (DELTASONE) 10 MG tablet Four qd for three d, three qd for three two qd for two d  25 tablet  0  . valACYclovir (VALTREX) 1000 MG tablet Take 1 tablet (1,000 mg total) by mouth daily.  30 tablet  5   Current Facility-Administered Medications  Medication Dose Route Frequency Provider Last Rate Last  Dose  . etonogestrel (IMPLANON) implant 68 mg  68 mg Subcutaneous Once Jacklyn ShellFrances Cresenzo-Dishmon, CNM        Previous Psychotropic Medications:  Medication Dose   Zoloft, Effexor, Lamictal, Abilify, Xanax                        Substance Abuse History in the last 12 months: Substance Age of 1st Use Last Use Amount Specific Type  Nicotine    smokes one half pack per day    Alcohol      Cannabis      Opiates      Cocaine      Methamphetamines      LSD      Ecstasy      Benzodiazepines      Caffeine      Inhalants      Others:  Medical Consequences of Substance Abuse: n/a  Legal Consequences of Substance Abuse: n/a  Family Consequences of Substance Abuse: n/a  Blackouts:  No DT's:  No Withdrawal Symptoms:  No None  Social History: Current Place of Residence: [place] [address] of Birth: [place] Family Members: Parents, 2 sisters, boyfriend, son Marital Status:  Single Children:   Sons: 1  Daughters:  Relationships: Lives with boyfriend Education:  GED Educational Problems/Performance: Became too anxious and depressed to attend high school Religious Beliefs/Practices: Unknown History of Abuse: none Occupational Experiences; cleaning houses Military History:  None. Legal History: none Hobbies/Interests: Walking with baby, reading  Family History:   Family History  Problem Relation Age of Onset  . Heart disease    . Arthritis    . Cancer    . Diabetes    . Hypertension    . Depression Mother   . Bipolar disorder Sister   . Drug abuse Paternal Aunt     Mental Status Examination/Evaluation: Objective:  Appearance: Casual and Well Groomed  Eye Contact::  Good  Speech:  Slow  Volume:  Decreased  Mood: Bright, much improved   Affect:  Congruent   Thought Process:  Goal Directed  Orientation:  Full (Time, Place, and Person)  Thought Content: Within normal limits  Suicidal Thoughts:  No   Homicidal Thoughts:  No  Judgement:  Good  Insight:  Good  Psychomotor Activity:  Decreased  Akathisia:  No  Handed:  Right  AIMS (if indicated):    Assets:  Communication Skills Desire for Improvement Social Support    Laboratory/X-Ray Psychological Evaluation(s)   Reviewed in record      Assessment:  Axis I: Generalized Anxiety Disorder, Major Depression, Recurrent severe and Obsessive Compulsive Disorder  AXIS I Generalized Anxiety Disorder, Major Depression, Recurrent severe and Obsessive Compulsive Disorder  AXIS II Deferred  AXIS III Past Medical History  Diagnosis Date  . IBS (irritable bowel syndrome)   . Acid reflux disease   . Major depression   . OCD (obsessive compulsive disorder)   . Anxiety   . Interstitial cystitis   . Pregnancy   . 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 Abnormal pap   . History of gonorrhea 2013  . Reflux   . Reactive airway disease   . URI (upper respiratory infection)      AXIS IV other psychosocial or environmental problems  AXIS V 51-60 moderate symptoms   Treatment Plan/Recommendations:  Plan of Care: Medication management   Laboratory:   Psychotherapy: She'll be assigned a therapist here   Medications: She will continue Paxil 40 mg each bedtime and Xanax 0.5 mg twice a day as needed   Routine PRN Medications:  No  Consultations:   Safety Concerns:  She denies thoughts of self-harm   Other:  She will return in 2 months     Diannia Ruder, MD 5/27/201510:56 AM

## 2013-12-07 ENCOUNTER — Ambulatory Visit (INDEPENDENT_AMBULATORY_CARE_PROVIDER_SITE_OTHER): Payer: 59 | Admitting: Psychology

## 2013-12-07 DIAGNOSIS — F3289 Other specified depressive episodes: Secondary | ICD-10-CM

## 2013-12-07 DIAGNOSIS — F32A Depression, unspecified: Secondary | ICD-10-CM

## 2013-12-07 DIAGNOSIS — F329 Major depressive disorder, single episode, unspecified: Secondary | ICD-10-CM

## 2013-12-29 ENCOUNTER — Encounter (HOSPITAL_COMMUNITY): Payer: Self-pay | Admitting: Psychology

## 2013-12-29 NOTE — Progress Notes (Signed)
    PROGRESS NOTE  Patient:  Tracy Flores   DOB: 06/20/1991  MR Number: [id number]  [facility]: [facility] [address] [place] [id number] Dept: [phone]  Start: 2 PM End: 3 PM  Provider/Observer:     Hershal CoriaJohn R Hameed Kolar PSYD  Chief Complaint:      Chief Complaint  Patient presents with  . Depression    Reason For Service:    The patient was referred by Dr. Tenny Crawoss because of ongoing difficulties with depression and panic attacks. these are reported to started around age 23 and she has been feeling like she was being bullied at school which precipitated great difficulties going to school. The patient reports that she would get very depressed and had difficulty engaging in life. She began seeing a counselor and psychiatrist and was tried on many medications. In 2012 she had 3 separate hospitalizations for depression. Currently, the patient reports that she is dealing with significant depression that tends to come and go. She denies any suicidal ideation now and reports that this is primarily because of having her son who is now 6014 months old. She reports that she is comforted by him and also feels a great responsibility.    Interventions Strategy:  Cognitive/behavioral psychotherapeutic interventions  Participation Level:   Active  Participation Quality:  Appropriate      Behavioral Observation:  Well Groomed, Alert, and Appropriate.   Current Psychosocial Factors: The patient reports that she has been working on some initial therapeutic interventions we discussed including trying to get more physical activity, improving her diet, and working on her sleep pattern. The patient reports that she has continued to have episodes of depressive symptoms but she is actively working on building better coping skills and strategies.  Content of Session:   Review current symptoms and work on therapeutic  interventions are in issues of recurrent depression.  Current Status:   The patient reports that she continues to work very hard on building her coping skills and strategies around recurrent issues of depression.  Patient Progress:   Improving  Target Goals:   Target goals include reducing the intensity, severity, and duration of symptoms of depression including feelings of helplessness and hopelessness, social isolation, suicidal ideation, and increasing her functionality.  Last Reviewed:   12/07/2013  Goals Addressed Today:    Goals addressed today have to do with working on building and establishing coping skills.  Impression/Diagnosis:   The patient has a long history of recurrent depression going back to at least age of 23. These developed around the time and feeling like she was being bullied at school and then she continued to have symptoms over the years. She has been treated by various counselors and psychiatrists over the years. The patient reports that in 2012 she was hospitalized 3 separate times for severe depressive events the suicidal ideation.  Diagnosis:    Axis I: Depression   Ellora Varnum R, PsyD 12/29/2013

## 2013-12-29 NOTE — Progress Notes (Signed)
    PROGRESS NOTE  Patient:  Tracy Flores   DOB: 01/26/1991  MR Number: [id number]  [facility] [address] [place] [address] Dept: [phone]  Start: 3 PM End: 4 PM  Provider/Observer:     Hershal CoriaJohn R Rodenbough PSYD  Chief Complaint:      Chief Complaint  Patient presents with  . Depression    Reason For Service:    The patient was referred by Dr. Tenny Crawoss because of ongoing difficulties with depression and panic attacks. these are reported to started around age 23 and she has been feeling like she was being bullied at school which precipitated great difficulties going to school. The patient reports that she would get very depressed and had difficulty engaging in life. She began seeing a counselor and psychiatrist and was tried on many medications. In 2012 she had 3 separate hospitalizations for depression. Currently, the patient reports that she is dealing with significant depression that tends to come and go. She denies any suicidal ideation now and reports that this is primarily because of having her son who is now 5614 months old. She reports that she is comforted by him and also feels a great responsibility.    Interventions Strategy:  Cognitive/behavioral psychotherapeutic interventions  Participation Level:   Active  Participation Quality:  Appropriate      Behavioral Observation:  Well Groomed, Alert, and Appropriate.   Current Psychosocial Factors: The patient reports that she has been doing better with the medications Paxil as well as having active in patient with her son. She reports that while she does have episodic episodes of depression that these are not constantly he used to be.  Content of Session:   Review current symptoms and work on therapeutic interventions are in issues of recurrent depression.  Current Status:   The patient has been doing much better over the  past couple of years and is responding well to medications and actively work on therapeutic interventions.  Patient Progress:   Improving  Target Goals:   Target goals include reducing the intensity, severity, and duration of symptoms of depression including feelings of helplessness and hopelessness, social isolation, suicidal ideation, and increasing her functionality.  Last Reviewed:   11/16/2013  Goals Addressed Today:    Goals addressed today have to do with working on building and establishing coping skills.  Impression/Diagnosis:   The patient has a long history of recurrent depression going back to at least age of 23. These developed around the time and feeling like she was being bullied at school and then she continued to have symptoms over the years. She has been treated by various counselors and psychiatrists over the years. The patient reports that in 2012 she was hospitalized 3 separate times for severe depressive events the suicidal ideation.  Diagnosis:    Axis I: Depression   RODENBOUGH,JOHN R, PsyD 12/29/2013

## 2014-01-11 ENCOUNTER — Ambulatory Visit (HOSPITAL_COMMUNITY): Payer: Self-pay | Admitting: Psychology

## 2014-01-21 ENCOUNTER — Ambulatory Visit (INDEPENDENT_AMBULATORY_CARE_PROVIDER_SITE_OTHER): Payer: 59 | Admitting: Psychiatry

## 2014-01-21 ENCOUNTER — Encounter (HOSPITAL_COMMUNITY): Payer: Self-pay | Admitting: Psychiatry

## 2014-01-21 VITALS — BP 110/70 | Ht 67.0 in | Wt 166.0 lb

## 2014-01-21 DIAGNOSIS — F32A Depression, unspecified: Secondary | ICD-10-CM

## 2014-01-21 DIAGNOSIS — F3289 Other specified depressive episodes: Secondary | ICD-10-CM

## 2014-01-21 DIAGNOSIS — F329 Major depressive disorder, single episode, unspecified: Secondary | ICD-10-CM

## 2014-01-21 MED ORDER — PAROXETINE HCL 40 MG PO TABS: 40.0000 mg | ORAL_TABLET | Freq: Every day | ORAL | Status: DC

## 2014-01-21 NOTE — Progress Notes (Signed)
Patient ID: Tracy Flores, female   DOB: Jan 06, 1991, 23 y.o.   MRN: [id number] Patient ID: Tracy Flores, female   DOB: [birth year], 23 y.o.   MRN: [id number]  Psychiatric Assessment Adult  Patient Identification:  Tracy Flores Date of Evaluation:  01/21/2014 Chief Complaint: "I'm doing better." History of Chief Complaint:   Chief Complaint  Patient presents with  . Anxiety  . Depression  . Follow-up    Anxiety Symptoms include nervous/anxious behavior.     this patient is a 23 year old single white female who lives her boyfriend and 74-month-old son in [place]. She works Education officer, environmental houses. She is referred by Dr.-Luking's office for assessment of depression and anxiety  The patient states that her difficulties with depression and panic attacks started at age 39. She felt like she was being bullied at school and had a hard time going to school. Sometimes she was too depressed to go out of bed. She was seeing a Veterinary surgeon and a psychiatrist in [place] and was tried on numerous medications. She's had negative reactions to several such as Zoloft not working Effexor making her worse Lamictal not working and Abilify causing restless legs.  In 2012 she had a particularly difficult year. She was hospitalized 3 times, once at [facility] and twice at [facility]. Prior to her last hospitalization she had made a suicide attempt by trying to crash up her car. She did have a good response to Paxil and Xanax but stopped the medications when she became pregnant last year. Since having her baby she's been depressed again and the Paxil was restarted.  This helped for a while but lately she's been more depressed and not finding much joy in things. She used to be on a higher dose of Paxil. She is sleeping fairly well but her energy is low she is tired all the time. She does spend time with her sister and friends but is very uncomfortable going out in public around crowds or  attending community college. She has panic attacks about once a week. She denies being suicidal and claims that this stopped when she had a child and was responsible for another person. She's not using drugs or alcohol. She's never had psychotic symptoms or manic symptoms. However she tends to worry and ruminate about the same things over and over and is constantly cleaning her house.  The patient returns after 2 months. She's doing very well. Her mood and energy are good. She's not anxious and rarely uses a Xanax. She's re enrolling at [employer] and wants to be a Psychiatrist. She recently went to the beach and enjoyed it but found she was very sun sensitive with the Paxil. I encouraged her to wear sunscreen and drink plenty of fluids. Review of Systems  Constitutional: Positive for fatigue.  HENT: Negative.   Eyes: Negative.   Respiratory: Negative.   Cardiovascular: Negative.   Gastrointestinal: Negative.   Endocrine: Negative.   Genitourinary: Negative.   Musculoskeletal: Negative.   Allergic/Immunologic: Negative.   Neurological: Negative.   Hematological: Negative.   Psychiatric/Behavioral: Positive for dysphoric mood. The patient is nervous/anxious.    Physical Exam not done  Depressive Symptoms: depressed mood, anhedonia, psychomotor retardation, fatigue, difficulty concentrating, anxiety, panic attacks,  (Hypo) Manic Symptoms:   Elevated Mood:  No Irritable Mood:  Yes Grandiosity:  No Distractibility:  Yes Labiality of Mood:  No Delusions:  No Hallucinations:  No Impulsivity:  No Sexually Inappropriate Behavior:  No  Financial Extravagance:  No Flight of Ideas:  No  Anxiety Symptoms: Excessive Worry:  Yes Panic Symptoms:  Yes Agoraphobia:  No Obsessive Compulsive: Yes  Symptoms: Cleans house compulsively Specific Phobias:  No Social Anxiety:  Yes  Psychotic Symptoms:  Hallucinations: No None Delusions:  No Paranoia:  No   Ideas  of Reference:  No  PTSD Symptoms: Ever had a traumatic exposure:  No Had a traumatic exposure in the last month:  No Re-experiencing: No None Hypervigilance:  No Hyperarousal: No None Avoidance: No None  Traumatic Brain Injury: No   Past Psychiatric History: Diagnosis: Major depression, panic disorder, OCD   Hospitalizations: 3 times in 2012   Outpatient Care: Has seen a therapist in her teen years   Substance Abuse Care: none  Self-Mutilation: none  Suicidal Attempts: Once by trying to Crash her car   Violent Behaviors: none   Past Medical History:   Past Medical History  Diagnosis Date  . IBS (irritable bowel syndrome)   . Acid reflux disease   . Major depression   . OCD (obsessive compulsive disorder)   . Anxiety   . Interstitial cystitis   . Pregnancy   . 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 Abnormal pap   . History of gonorrhea 2013  . Reflux   . Reactive airway disease   . URI (upper respiratory infection)    History of Loss of Consciousness:  No Seizure History:  No Cardiac History:  No Allergies:   Allergies  Allergen Reactions  . Abilify [Aripiprazole]     Restless legs  . Celexa [Citalopram Hydrobromide]     Agitation   . Clindamycin/Lincomycin   . Penicillins Other (See Comments)    Unknown Childhood reaction  . Zoloft [Sertraline Hcl]     Suicidal thoughts  . Amoxil [Amoxicillin] Rash   Current Medications:  Current Outpatient Prescriptions  Medication Sig Dispense Refill  . ALPRAZolam (XANAX) 0.5 MG tablet Take 1 tablet (0.5 mg total) by mouth 2 (two) times daily.  60 tablet  2  . PARoxetine (PAXIL) 40 MG tablet Take 1 tablet (40 mg total) by mouth at bedtime.  30 tablet  2  . predniSONE (DELTASONE) 10 MG tablet Four qd for three d, three qd for three two qd for two d  25 tablet  0  . valACYclovir (VALTREX) 1000 MG tablet Take 1 tablet (1,000 mg total) by mouth daily.  30 tablet  5   Current Facility-Administered Medications   Medication Dose Route Frequency Provider Last Rate Last Dose  . etonogestrel (IMPLANON) implant 68 mg  68 mg Subcutaneous Once Jacklyn ShellFrances Cresenzo-Dishmon, CNM        Previous Psychotropic Medications:  Medication Dose   Zoloft, Effexor, Lamictal, Abilify, Xanax                        Substance Abuse History in the last 12 months: Substance Age of 1st Use Last Use Amount Specific Type  Nicotine    smokes one half pack per day    Alcohol      Cannabis      Opiates      Cocaine      Methamphetamines      LSD      Ecstasy      Benzodiazepines      Caffeine      Inhalants      Others:  Medical Consequences of Substance Abuse: n/a  Legal Consequences of Substance Abuse: n/a  Family Consequences of Substance Abuse: n/a  Blackouts:  No DT's:  No Withdrawal Symptoms:  No None  Social History: Current Place of Residence: [place] [address] of Birth: [place] Family Members: Parents, 2 sisters, boyfriend, son Marital Status:  Single Children:   Sons: 1  Daughters:  Relationships: Lives with boyfriend Education:  GED Educational Problems/Performance: Became too anxious and depressed to attend high school Religious Beliefs/Practices: Unknown History of Abuse: none Occupational Experiences; cleaning houses Military History:  None. Legal History: none Hobbies/Interests: Walking with baby, reading  Family History:   Family History  Problem Relation Age of Onset  . Heart disease    . Arthritis    . Cancer    . Diabetes    . Hypertension    . Depression Mother   . Bipolar disorder Sister   . Drug abuse Paternal Aunt     Mental Status Examination/Evaluation: Objective:  Appearance: Casual and Well Groomed  Eye Contact::  Good  Speech:  Slow  Volume:  Decreased  Mood: Bright, much improved   Affect:  Congruent   Thought Process:  Goal Directed  Orientation:  Full (Time, Place, and Person)  Thought  Content: Within normal limits  Suicidal Thoughts:  No  Homicidal Thoughts:  No  Judgement:  Good  Insight:  Good  Psychomotor Activity:  Decreased  Akathisia:  No  Handed:  Right  AIMS (if indicated):    Assets:  Communication Skills Desire for Improvement Social Support    Laboratory/X-Ray Psychological Evaluation(s)   Reviewed in record      Assessment:  Axis I: Generalized Anxiety Disorder, Major Depression, Recurrent severe and Obsessive Compulsive Disorder  AXIS I Generalized Anxiety Disorder, Major Depression, Recurrent severe and Obsessive Compulsive Disorder  AXIS II Deferred  AXIS III Past Medical History  Diagnosis Date  . IBS (irritable bowel syndrome)   . Acid reflux disease   . Major depression   . OCD (obsessive compulsive disorder)   . Anxiety   . Interstitial cystitis   . Pregnancy   . 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 Abnormal pap   . History of gonorrhea 2013  . Reflux   . Reactive airway disease   . URI (upper respiratory infection)      AXIS IV other psychosocial or environmental problems  AXIS V 51-60 moderate symptoms   Treatment Plan/Recommendations:  Plan of Care: Medication management   Laboratory:   Psychotherapy: She'll be assigned a therapist here   Medications: She will continue Paxil 40 mg each bedtime and Xanax 0.5 mg twice a day as needed   Routine PRN Medications:  No  Consultations:   Safety Concerns:  She denies thoughts of self-harm   Other:  She will return in 3 months     Diannia Ruder, MD 7/24/201511:54 AM

## 2014-02-03 ENCOUNTER — Telehealth: Payer: Self-pay | Admitting: Obstetrics & Gynecology

## 2014-02-03 NOTE — Telephone Encounter (Signed)
Pt states was pregnant in Fall semester of 2013, unable finish courses due to pregnancy and having to stop the medication Paxil. Pt requesting a letter stating this information to re-enroll in this semester.

## 2014-02-04 ENCOUNTER — Telehealth: Payer: Self-pay | Admitting: Obstetrics & Gynecology

## 2014-02-04 NOTE — Telephone Encounter (Signed)
Pt informed message routed to provider for a letter for school.

## 2014-02-07 NOTE — Telephone Encounter (Signed)
Pt states she was advised to quit taking paxil and xanax at the beginning of her pregnancy in Fall 2013, as a result she unenrolled from her courses at college d/t anxiety, needs a note saying that she was advised to quit medications, so that she can re-enroll. Note given, pt to pick up from front desk.  Cheral MarkerKimberly R. Tamyka Bezio, CNM, WHNP-BC 02/07/2014 11:00 AM

## 2014-04-15 ENCOUNTER — Other Ambulatory Visit: Payer: Self-pay | Admitting: Nurse Practitioner

## 2014-04-22 ENCOUNTER — Ambulatory Visit (HOSPITAL_COMMUNITY): Payer: Self-pay | Admitting: Psychiatry

## 2014-05-02 ENCOUNTER — Ambulatory Visit (INDEPENDENT_AMBULATORY_CARE_PROVIDER_SITE_OTHER): Payer: MEDICAID | Admitting: Psychiatry

## 2014-05-02 ENCOUNTER — Encounter (HOSPITAL_COMMUNITY): Payer: Self-pay | Admitting: Psychiatry

## 2014-05-02 VITALS — BP 108/80 | Ht 67.0 in | Wt 171.0 lb

## 2014-05-02 DIAGNOSIS — F329 Major depressive disorder, single episode, unspecified: Secondary | ICD-10-CM

## 2014-05-02 DIAGNOSIS — F32A Depression, unspecified: Secondary | ICD-10-CM

## 2014-05-02 DIAGNOSIS — F900 Attention-deficit hyperactivity disorder, predominantly inattentive type: Secondary | ICD-10-CM

## 2014-05-02 DIAGNOSIS — F42 Obsessive-compulsive disorder: Secondary | ICD-10-CM

## 2014-05-02 DIAGNOSIS — F411 Generalized anxiety disorder: Secondary | ICD-10-CM

## 2014-05-02 DIAGNOSIS — F332 Major depressive disorder, recurrent severe without psychotic features: Secondary | ICD-10-CM

## 2014-05-02 MED ORDER — ALPRAZOLAM 0.5 MG PO TABS: 0.5000 mg | ORAL_TABLET | Freq: Two times a day (BID) | ORAL | Status: DC

## 2014-05-02 MED ORDER — LISDEXAMFETAMINE DIMESYLATE 40 MG PO CAPS: 40.0000 mg | ORAL_CAPSULE | ORAL | Status: DC

## 2014-05-02 MED ORDER — PAROXETINE HCL 40 MG PO TABS: 40.0000 mg | ORAL_TABLET | Freq: Every day | ORAL | Status: DC

## 2014-05-02 NOTE — Progress Notes (Signed)
Patient ID: Tracy Flores, female   DOB: 11/06/1990, 23 y.o.   MRN: [id number] Patient ID: Tracy Flores, female   DOB: 1990-10-18, 23 y.o.   MRN: [id number] Patient ID: Tracy Flores, female   DOB: 03-31-1991, 23 y.o.   MRN: [id number]  Psychiatric Assessment Adult  Patient Identification:  Tracy Flores Date of Evaluation:  05/02/2014 Chief Complaint: "I'm doing better." History of Chief Complaint:   Chief Complaint  Patient presents with  . Depression  . Anxiety    Anxiety Symptoms include nervous/anxious behavior.     this patient is a 23 year old single white female who lives her boyfriend and 74-month-old son in [place]. She works Education officer, environmental houses. She is referred by Dr.-Luking's office for assessment of depression and anxiety  The patient states that her difficulties with depression and panic attacks started at age 56. She felt like she was being bullied at school and had a hard time going to school. Sometimes she was too depressed to go out of bed. She was seeing a Veterinary surgeon and a psychiatrist in [place] and was tried on numerous medications. She's had negative reactions to several such as Zoloft not working Effexor making her worse Lamictal not working and Abilify causing restless legs.  In 2012 she had a particularly difficult year. She was hospitalized 3 times, once at the [facility] and twice at [facility]. Prior to her last hospitalization she had made a suicide attempt by trying to crash up her car. She did have a good response to Paxil and Xanax but stopped the medications when she became pregnant last year. Since having her baby she's been depressed again and the Paxil was restarted.  This helped for a while but lately she's been more depressed and not finding much joy in things. She used to be on a higher dose of Paxil. She is sleeping fairly well but her energy is low she is tired all the time. She does spend time with her sister and friends but is  very uncomfortable going out in public around crowds or attending community college. She has panic attacks about once a week. She denies being suicidal and claims that this stopped when she had a child and was responsible for another person. She's not using drugs or alcohol. She's never had psychotic symptoms or manic symptoms. However she tends to worry and ruminate about the same things over and over and is constantly cleaning her house.  The patient returns after 4 months.in general she is doing well. She is now attending [employer] and taking 3 courses. She's doing okay but finding it very hard to concentrate and focus particular on homework. She was diagnosed with ADD in middle school and used to be on Concerta but it didn't help all that much. Most of her classes are in the afternoon and she tries to study in the early evening. I told her we could try Vyvanse to see if this would help. Her mood is been very good on the Paxil. She only uses Xanax occasionally particularly when she has to speak in front of a group Review of Systems  Constitutional: Positive for fatigue.  HENT: Negative.   Eyes: Negative.   Respiratory: Negative.   Cardiovascular: Negative.   Gastrointestinal: Negative.   Endocrine: Negative.   Genitourinary: Negative.   Musculoskeletal: Negative.   Allergic/Immunologic: Negative.   Neurological: Negative.   Hematological: Negative.   Psychiatric/Behavioral: Positive for dysphoric mood. The patient is nervous/anxious.  Physical Exam not done  Depressive Symptoms: depressed mood, anhedonia, psychomotor retardation, fatigue, difficulty concentrating, anxiety, panic attacks,  (Hypo) Manic Symptoms:   Elevated Mood:  No Irritable Mood:  Yes Grandiosity:  No Distractibility:  Yes Labiality of Mood:  No Delusions:  No Hallucinations:  No Impulsivity:  No Sexually Inappropriate Behavior:  No Financial Extravagance:  No Flight of Ideas:   No  Anxiety Symptoms: Excessive Worry:  Yes Panic Symptoms:  Yes Agoraphobia:  No Obsessive Compulsive: Yes  Symptoms: Cleans house compulsively Specific Phobias:  No Social Anxiety:  Yes  Psychotic Symptoms:  Hallucinations: No None Delusions:  No Paranoia:  No   Ideas of Reference:  No  PTSD Symptoms: Ever had a traumatic exposure:  No Had a traumatic exposure in the last month:  No Re-experiencing: No None Hypervigilance:  No Hyperarousal: No None Avoidance: No None  Traumatic Brain Injury: No   Past Psychiatric History: Diagnosis: Major depression, panic disorder, OCD   Hospitalizations: 3 times in 2012   Outpatient Care: Has seen a therapist in her teen years   Substance Abuse Care: none  Self-Mutilation: none  Suicidal Attempts: Once by trying to Crash her car   Violent Behaviors: none   Past Medical History:   Past Medical History  Diagnosis Date  . IBS (irritable bowel syndrome)   . Acid reflux disease   . Major depression   . OCD (obsessive compulsive disorder)   . Anxiety   . Interstitial cystitis   . Pregnancy   . 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 Abnormal pap   . History of gonorrhea 2013  . Reflux   . Reactive airway disease   . URI (upper respiratory infection)    History of Loss of Consciousness:  No Seizure History:  No Cardiac History:  No Allergies:   Allergies  Allergen Reactions  . Abilify [Aripiprazole]     Restless legs  . Celexa [Citalopram Hydrobromide]     Agitation   . Clindamycin/Lincomycin   . Penicillins Other (See Comments)    Unknown Childhood reaction  . Zoloft [Sertraline Hcl]     Suicidal thoughts  . Amoxil [Amoxicillin] Rash   Current Medications:  Current Outpatient Prescriptions  Medication Sig Dispense Refill  . ALPRAZolam (XANAX) 0.5 MG tablet Take 1 tablet (0.5 mg total) by mouth 2 (two) times daily. 60 tablet 2  . ALPRAZolam (XANAX) 0.5 MG tablet Take 1 tablet (0.5 mg total) by mouth 2  (two) times daily. 60 tablet 2  . lisdexamfetamine (VYVANSE) 40 MG capsule Take 1 capsule (40 mg total) by mouth every morning. 30 capsule 0  . PARoxetine (PAXIL) 40 MG tablet Take 1 tablet (40 mg total) by mouth at bedtime. 30 tablet 2  . predniSONE (DELTASONE) 10 MG tablet Four qd for three d, three qd for three two qd for two d 25 tablet 0  . valACYclovir (VALTREX) 1000 MG tablet TAKE ONE (1) TABLET BY MOUTH EVERY DAY 30 tablet 1   Current Facility-Administered Medications  Medication Dose Route Frequency Provider Last Rate Last Dose  . etonogestrel (IMPLANON) implant 68 mg  68 mg Subcutaneous Once Jacklyn ShellFrances Cresenzo-Dishmon, CNM        Previous Psychotropic Medications:  Medication Dose   Zoloft, Effexor, Lamictal, Abilify, Xanax                        Substance Abuse History in the last 12 months: Substance Age of 1st Use Last  Use Amount Specific Type  Nicotine    smokes one half pack per day    Alcohol      Cannabis      Opiates      Cocaine      Methamphetamines      LSD      Ecstasy      Benzodiazepines      Caffeine      Inhalants      Others:                          Medical Consequences of Substance Abuse: n/a  Legal Consequences of Substance Abuse: n/a  Family Consequences of Substance Abuse: n/a  Blackouts:  No DT's:  No Withdrawal Symptoms:  No None  Social History: Current Place of Residence: [place] [address] of Birth: [place] Family Members: Parents, 2 sisters, boyfriend, son Marital Status:  Single Children:   Sons: 1  Daughters:  Relationships: Lives with boyfriend Education:  GED Educational Problems/Performance: Became too anxious and depressed to attend high school Religious Beliefs/Practices: Unknown History of Abuse: none Occupational Experiences; cleaning houses Military History:  None. Legal History: none Hobbies/Interests: Walking with baby, reading  Family History:   Family History   Problem Relation Age of Onset  . Heart disease    . Arthritis    . Cancer    . Diabetes    . Hypertension    . Depression Mother   . Bipolar disorder Sister   . Drug abuse Paternal Aunt     Mental Status Examination/Evaluation: Objective:  Appearance: Casual and Well Groomed  Eye Contact::  Good  Speech:  Slow  Volume:  Decreased  Mood: Bright,   Affect:  Congruent   Thought Process:  Goal Directed  Orientation:  Full (Time, Place, and Person)  Thought Content: Within normal limits  Suicidal Thoughts:  No  Homicidal Thoughts:  No  Judgement:  Good  Insight:  Good  Psychomotor Activity:  Decreased  Akathisia:  No  Handed:  Right  AIMS (if indicated):    Assets:  Communication Skills Desire for Improvement Social Support    Laboratory/X-Ray Psychological Evaluation(s)   Reviewed in record      Assessment:  Axis I: Generalized Anxiety Disorder, Major Depression, Recurrent severe and Obsessive Compulsive DisorderADD  AXIS I Generalized Anxiety Disorder, Major Depression, Recurrent severe and Obsessive Compulsive Disorder ADD  AXIS II Deferred  AXIS III Past Medical History  Diagnosis Date  . IBS (irritable bowel syndrome)   . Acid reflux disease   . Major depression   . OCD (obsessive compulsive disorder)   . Anxiety   . Interstitial cystitis   . Pregnancy   . 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 Abnormal pap   . History of gonorrhea 2013  . Reflux   . Reactive airway disease   . URI (upper respiratory infection)      AXIS IV other psychosocial or environmental problems  AXIS V 51-60 moderate symptoms   Treatment Plan/Recommendations:  Plan of Care: Medication management   Laboratory:   Psychotherapy: She'll be assigned a therapist here   Medications: She will continue Paxil 40 mg each bedtime and Xanax 0.5 mg twice a day as needed.she'll start Vyvanse 40 mg each lunchtime so it will kick in before her classes start   Routine PRN  Medications:  No  Consultations:   Safety Concerns:  She denies thoughts of self-harm  Other:  She will return in 4 weeks    Diannia RuderOSS, DEBORAH, MD 11/2/20159:03 AM

## 2014-05-30 ENCOUNTER — Ambulatory Visit (INDEPENDENT_AMBULATORY_CARE_PROVIDER_SITE_OTHER): Payer: MEDICAID | Admitting: Psychiatry

## 2014-05-30 ENCOUNTER — Encounter (HOSPITAL_COMMUNITY): Payer: Self-pay | Admitting: Psychiatry

## 2014-05-30 VITALS — BP 106/53 | HR 70 | Wt 159.6 lb

## 2014-05-30 DIAGNOSIS — F913 Oppositional defiant disorder: Secondary | ICD-10-CM

## 2014-05-30 DIAGNOSIS — F332 Major depressive disorder, recurrent severe without psychotic features: Secondary | ICD-10-CM

## 2014-05-30 DIAGNOSIS — F329 Major depressive disorder, single episode, unspecified: Secondary | ICD-10-CM

## 2014-05-30 DIAGNOSIS — F909 Attention-deficit hyperactivity disorder, unspecified type: Secondary | ICD-10-CM

## 2014-05-30 DIAGNOSIS — F32A Depression, unspecified: Secondary | ICD-10-CM

## 2014-05-30 DIAGNOSIS — F411 Generalized anxiety disorder: Secondary | ICD-10-CM

## 2014-05-30 MED ORDER — LISDEXAMFETAMINE DIMESYLATE 60 MG PO CAPS: 60.0000 mg | ORAL_CAPSULE | ORAL | Status: DC

## 2014-05-30 MED ORDER — PAROXETINE HCL 40 MG PO TABS: 40.0000 mg | ORAL_TABLET | Freq: Every day | ORAL | Status: DC

## 2014-05-30 NOTE — Progress Notes (Signed)
Patient ID: Tracy FootmanMallory D Schmelzle, female   DOB: 05/31/1991, 23 y.o.   MRN: [id number] Patient ID: Tracy FootmanMallory D Galvis, female   DOB: 12/10/1990, 23 y.o.   MRN: [id number] Patient ID: Tracy FootmanMallory D Barasch, female   DOB: 11/28/1990, 23 y.o.   MRN: [id number] Patient ID: Tracy FootmanMallory D Trower, female   DOB: 09/08/1990, 23 y.o.   MRN: [id number]  Psychiatric Assessment Adult  Patient Identification:  Tracy Flores Date of Evaluation:  05/30/2014 Chief Complaint: "I'm doing better." History of Chief Complaint:   Chief Complaint  Patient presents with  . ADD  . Anxiety  . Depression  . Follow-up    Anxiety Symptoms include nervous/anxious behavior.     this patient is a 23 year old single white female who lives her boyfriend and 2782-month-old [place] in [place]. She works Education officer, environmentalcleaning houses. She is referred by Dr.-Luking's office for assessment of depression and anxiety  The patient states that her difficulties with depression and panic attacks started at age 23. She felt like she was being bullied at school and had a hard time going to school. Sometimes she was too depressed to go out of bed. She was seeing a Veterinary surgeoncounselor and a psychiatrist in [place] and was tried on numerous medications. She's had negative reactions to several such as Zoloft not working Effexor making her worse Lamictal not working and Abilify causing restless legs.  In 2012 she had a particularly difficult year. She was hospitalized 3 times, once at the [facility] and twice at old [facility]. Prior to her last hospitalization she had made a suicide attempt by trying to crash up her car. She did have a good response to Paxil and Xanax but stopped the medications when she became pregnant last year. Since having her baby she's been depressed again and the Paxil was restarted.  This helped for a while but lately she's been more depressed and not finding much joy in things. She used to be on a higher dose of Paxil. She is sleeping fairly well  but her energy is low she is tired all the time. She does spend time with her sister and friends but is very uncomfortable going out in public around crowds or attending community college. She has panic attacks about once a week. She denies being suicidal and claims that this stopped when she had a child and was responsible for another person. She's not using drugs or alcohol. She's never had psychotic symptoms or manic symptoms. However she tends to worry and ruminate about the same things over and over and is constantly cleaning her house.  The patient returns after 4 weeks. She is now taking Vyvanse 40 mg daily. It seemed to help greatly in the beginning but now it seems to be diminished. She still getting B's and most of her classes. She's very anxious about giving a speech and her public speaking class and I suggested she use a Xanax before the speech and she agrees. Her mood is been stable on the Paxil. I also suggested we increase her Vyvanse to 60 mg daily to see if this would help Review of Systems  Constitutional: Positive for fatigue.  HENT: Negative.   Eyes: Negative.   Respiratory: Negative.   Cardiovascular: Negative.   Gastrointestinal: Negative.   Endocrine: Negative.   Genitourinary: Negative.   Musculoskeletal: Negative.   Allergic/Immunologic: Negative.   Neurological: Negative.   Hematological: Negative.   Psychiatric/Behavioral: Positive for dysphoric mood. The patient is nervous/anxious.    Physical Exam  not done  Depressive Symptoms: depressed mood, anhedonia, psychomotor retardation, fatigue, difficulty concentrating, anxiety, panic attacks,  (Hypo) Manic Symptoms:   Elevated Mood:  No Irritable Mood:  Yes Grandiosity:  No Distractibility:  Yes Labiality of Mood:  No Delusions:  No Hallucinations:  No Impulsivity:  No Sexually Inappropriate Behavior:  No Financial Extravagance:  No Flight of Ideas:  No  Anxiety Symptoms: Excessive Worry:  Yes Panic  Symptoms:  Yes Agoraphobia:  No Obsessive Compulsive: Yes  Symptoms: Cleans house compulsively Specific Phobias:  No Social Anxiety:  Yes  Psychotic Symptoms:  Hallucinations: No None Delusions:  No Paranoia:  No   Ideas of Reference:  No  PTSD Symptoms: Ever had a traumatic exposure:  No Had a traumatic exposure in the last month:  No Re-experiencing: No None Hypervigilance:  No Hyperarousal: No None Avoidance: No None  Traumatic Brain Injury: No   Past Psychiatric History: Diagnosis: Major depression, panic disorder, OCD   Hospitalizations: 3 times in 2012   Outpatient Care: Has seen a therapist in her teen years   Substance Abuse Care: none  Self-Mutilation: none  Suicidal Attempts: Once by trying to Crash her car   Violent Behaviors: none   Past Medical History:   Past Medical History  Diagnosis Date  . IBS (irritable bowel syndrome)   . Acid reflux disease   . Major depression   . OCD (obsessive compulsive disorder)   . Anxiety   . Interstitial cystitis   . Pregnancy   . 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 Abnormal pap   . History of gonorrhea 2013  . Reflux   . Reactive airway disease   . URI (upper respiratory infection)    History of Loss of Consciousness:  No Seizure History:  No Cardiac History:  No Allergies:   Allergies  Allergen Reactions  . Abilify [Aripiprazole]     Restless legs  . Celexa [Citalopram Hydrobromide]     Agitation   . Clindamycin/Lincomycin   . Penicillins Other (See Comments)    Unknown Childhood reaction  . Zoloft [Sertraline Hcl]     Suicidal thoughts  . Amoxil [Amoxicillin] Rash   Current Medications:  Current Outpatient Prescriptions  Medication Sig Dispense Refill  . ALPRAZolam (XANAX) 0.5 MG tablet Take 1 tablet (0.5 mg total) by mouth 2 (two) times daily. 60 tablet 2  . PARoxetine (PAXIL) 40 MG tablet Take 1 tablet (40 mg total) by mouth at bedtime. 30 tablet 2  . valACYclovir (VALTREX) 1000  MG tablet TAKE ONE (1) TABLET BY MOUTH EVERY DAY 30 tablet 1  . lisdexamfetamine (VYVANSE) 60 MG capsule Take 1 capsule (60 mg total) by mouth every morning. 30 capsule 0  . lisdexamfetamine (VYVANSE) 60 MG capsule Take 1 capsule (60 mg total) by mouth every morning. 30 capsule 0   Current Facility-Administered Medications  Medication Dose Route Frequency Provider Last Rate Last Dose  . etonogestrel (IMPLANON) implant 68 mg  68 mg Subcutaneous Once Jacklyn Shell, CNM        Previous Psychotropic Medications:  Medication Dose   Zoloft, Effexor, Lamictal, Abilify, Xanax                        Substance Abuse History in the last 12 months: Substance Age of 1st Use Last Use Amount Specific Type  Nicotine    smokes one half pack per day    Alcohol      Cannabis  Opiates      Cocaine      Methamphetamines      LSD      Ecstasy      Benzodiazepines      Caffeine      Inhalants      Others:                          Medical Consequences of Substance Abuse: n/a  Legal Consequences of Substance Abuse: n/a  Family Consequences of Substance Abuse: n/a  Blackouts:  No DT's:  No Withdrawal Symptoms:  No None  Social History: Current Place [place] Residence: [place] [address] [place] Place of Birth: [place] Family Members: Parents, 2 sisters, boyfriend, son Marital Status:  Single Children:   Sons: 1  Daughters:  Relationships: Lives with boyfriend Education:  GED Educational Problems/Performance: Became too anxious and depressed to attend high school Religious Beliefs/Practices: Unknown History of Abuse: none Occupational Experiences; cleaning houses Military History:  None. Legal History: none Hobbies/Interests: Walking with baby, reading  Family History:   Family History  Problem Relation Age of Onset  . Heart disease    . Arthritis    . Cancer    . Diabetes    . Hypertension    . Depression Mother   . Bipolar disorder Sister    . Drug abuse Paternal Aunt     Mental Status Examination/Evaluation: Objective:  Appearance: Casual and Well Groomed  Eye Contact::  Good  Speech:  Slow  Volume:  Decreased  Mood: Bright, a bit anxious   Affect:  Congruent   Thought Process:  Goal Directed  Orientation:  Full (Time, Place, and Person)  Thought Content: Within normal limits  Suicidal Thoughts:  No  Homicidal Thoughts:  No  Judgement:  Good  Insight:  Good  Psychomotor Activity:  Decreased  Akathisia:  No  Handed:  Right  AIMS (if indicated):    Assets:  Communication Skills Desire for Improvement Social Support    Laboratory/X-Ray Psychological Evaluation(s)   Reviewed in record      Assessment:  Axis I: Generalized Anxiety Disorder, Major Depression, Recurrent severe and Obsessive Compulsive DisorderADD  AXIS I Generalized Anxiety Disorder, Major Depression, Recurrent severe and Obsessive Compulsive Disorder ADD  AXIS II Deferred  AXIS III Past Medical History  Diagnosis Date  . IBS (irritable bowel syndrome)   . Acid reflux disease   . Major depression   . OCD (obsessive compulsive disorder)   . Anxiety   . Interstitial cystitis   . Pregnancy   . 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 Abnormal pap   . History of gonorrhea 2013  . Reflux   . Reactive airway disease   . URI (upper respiratory infection)      AXIS IV other psychosocial or environmental problems  AXIS V 51-60 moderate symptoms   Treatment Plan/Recommendations:  Plan of Care: Medication management   Laboratory:   Psychotherapy: She'll be assigned a therapist here   Medications: She will continue Paxil 40 mg each bedtime and Xanax 0.5 mg twice a day as needed.she'll increase Vyvanse to 60 mg each lunchtime so it will kick in before her classes start   Routine PRN Medications:  No  Consultations:   Safety Concerns:  She denies thoughts of self-harm   Other:  She will return in 2 months     Izak Anding, Gavin PoundEBORAH,  MD 11/30/201511:13 AM

## 2014-07-04 ENCOUNTER — Other Ambulatory Visit: Payer: Self-pay | Admitting: Family Medicine

## 2014-07-29 ENCOUNTER — Ambulatory Visit (HOSPITAL_COMMUNITY): Payer: Self-pay | Admitting: Psychiatry

## 2014-08-17 ENCOUNTER — Ambulatory Visit (HOSPITAL_COMMUNITY): Payer: Self-pay | Admitting: Psychiatry

## 2014-09-06 ENCOUNTER — Ambulatory Visit (INDEPENDENT_AMBULATORY_CARE_PROVIDER_SITE_OTHER): Payer: MEDICAID | Admitting: Psychiatry

## 2014-09-06 ENCOUNTER — Encounter (HOSPITAL_COMMUNITY): Payer: Self-pay | Admitting: Psychiatry

## 2014-09-06 VITALS — BP 122/75 | HR 90 | Ht 67.0 in | Wt 143.4 lb

## 2014-09-06 DIAGNOSIS — F909 Attention-deficit hyperactivity disorder, unspecified type: Secondary | ICD-10-CM

## 2014-09-06 DIAGNOSIS — F411 Generalized anxiety disorder: Secondary | ICD-10-CM

## 2014-09-06 DIAGNOSIS — F42 Obsessive-compulsive disorder: Secondary | ICD-10-CM

## 2014-09-06 DIAGNOSIS — F329 Major depressive disorder, single episode, unspecified: Secondary | ICD-10-CM

## 2014-09-06 DIAGNOSIS — F32A Depression, unspecified: Secondary | ICD-10-CM

## 2014-09-06 MED ORDER — ALPRAZOLAM 0.5 MG PO TABS: 0.5000 mg | ORAL_TABLET | Freq: Two times a day (BID) | ORAL | Status: DC

## 2014-09-06 MED ORDER — LISDEXAMFETAMINE DIMESYLATE 60 MG PO CAPS
60.0000 mg | ORAL_CAPSULE | ORAL | Status: DC
Start: 2014-09-06 — End: 2015-02-15

## 2014-09-06 MED ORDER — LISDEXAMFETAMINE DIMESYLATE 60 MG PO CAPS: 60.0000 mg | ORAL_CAPSULE | ORAL | Status: DC

## 2014-09-06 MED ORDER — PAROXETINE HCL 40 MG PO TABS: 40.0000 mg | ORAL_TABLET | Freq: Every day | ORAL | Status: DC

## 2014-09-06 NOTE — Progress Notes (Signed)
Patient ID: Tracy Flores, female   DOB: 1990-11-03, 24 y.o.   MRN: [id number] Patient ID: Tracy Flores, female   DOB: 1990-09-01, 24 y.o.   MRN: [id number] Patient ID: Tracy Flores, female   DOB: 1990/10/28, 24 y.o.   MRN: [id number] Patient ID: Tracy Flores, female   DOB: [birth year], 24 y.o.   MRN: [id number] Patient ID: Tracy Flores, female   DOB: 09-13-1990, 24 y.o.   MRN: [id number]  Psychiatric Assessment Adult  Patient Identification:  Tracy Flores Date of Evaluation:  09/06/2014 Chief Complaint: "I'm doing better." History of Chief Complaint:   Chief Complaint  Patient presents with  . ADHD  . Anxiety  . Depression  . Follow-up    Anxiety Symptoms include nervous/anxious behavior.     this patient is a 89 year old single white female who lives her boyfriend and 74-month-old son in [place]. She works Education officer, environmental houses. She is referred by Dr.-Luking's office for assessment of depression and anxiety  The patient states that her difficulties with depression and panic attacks started at age 57. She felt like she was being bullied at school and had a hard time going to school. Sometimes she was too depressed to go out of bed. She was seeing a Veterinary surgeon and a psychiatrist in [place] and was tried on numerous medications. She's had negative reactions to several such as Zoloft not working Effexor making her worse Lamictal not working and Abilify causing restless legs.  In 2012 she had a particularly difficult year. She was hospitalized 3 times, once at the [facility] and twice at old [facility]. Prior to her last hospitalization she had made a suicide attempt by trying to crash up her car. She did have a good response to Paxil and Xanax but stopped the medications when she became pregnant last year. Since having her baby she's been depressed again and the Paxil was restarted.  This helped for a while but lately she's been more depressed and not finding much joy in  things. She used to be on a higher dose of Paxil. She is sleeping fairly well but her energy is low she is tired all the time. She does spend time with her sister and friends but is very uncomfortable going out in public around crowds or attending community college. She has panic attacks about once a week. She denies being suicidal and claims that this stopped when she had a child and was responsible for another person. She's not using drugs or alcohol. She's never had psychotic symptoms or manic symptoms. However she tends to worry and ruminate about the same things over and over and is constantly cleaning her house.  The patient returns after 3 months. She's now on Vyvanse 60 mg daily. She's doing extremely well in school and getting all A's and B's. She is in a pre-radiography program. She is speaking out more and seems to gain a lot of confidence. Her mood is good and she denies any depressive symptoms. She rarely has to use the Xanax. She is very upbeat and bubbly today Review of Systems  Constitutional: Positive for fatigue.  HENT: Negative.   Eyes: Negative.   Respiratory: Negative.   Cardiovascular: Negative.   Gastrointestinal: Negative.   Endocrine: Negative.   Genitourinary: Negative.   Musculoskeletal: Negative.   Allergic/Immunologic: Negative.   Neurological: Negative.   Hematological: Negative.   Psychiatric/Behavioral: Positive for dysphoric mood. The patient is nervous/anxious.    Physical Exam not done  Depressive  Symptoms: depressed mood, anhedonia, psychomotor retardation, fatigue, difficulty concentrating, anxiety, panic attacks,  (Hypo) Manic Symptoms:   Elevated Mood:  No Irritable Mood:  Yes Grandiosity:  No Distractibility:  Yes Labiality of Mood:  No Delusions:  No Hallucinations:  No Impulsivity:  No Sexually Inappropriate Behavior:  No Financial Extravagance:  No Flight of Ideas:  No  Anxiety Symptoms: Excessive Worry:  Yes Panic Symptoms:   Yes Agoraphobia:  No Obsessive Compulsive: Yes  Symptoms: Cleans house compulsively Specific Phobias:  No Social Anxiety:  Yes  Psychotic Symptoms:  Hallucinations: No None Delusions:  No Paranoia:  No   Ideas of Reference:  No  PTSD Symptoms: Ever had a traumatic exposure:  No Had a traumatic exposure in the last month:  No Re-experiencing: No None Hypervigilance:  No Hyperarousal: No None Avoidance: No None  Traumatic Brain Injury: No   Past Psychiatric History: Diagnosis: Major depression, panic disorder, OCD   Hospitalizations: 3 times in 2012   Outpatient Care: Has seen a therapist in her teen years   Substance Abuse Care: none  Self-Mutilation: none  Suicidal Attempts: Once by trying to Crash her car   Violent Behaviors: none   Past Medical History:   Past Medical History  Diagnosis Date  . IBS (irritable bowel syndrome)   . Acid reflux disease   . Major depression   . OCD (obsessive compulsive disorder)   . Anxiety   . Interstitial cystitis   . Pregnancy   . 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 Abnormal pap   . History of gonorrhea 2013  . Reflux   . Reactive airway disease   . URI (upper respiratory infection)    History of Loss of Consciousness:  No Seizure History:  No Cardiac History:  No Allergies:   Allergies  Allergen Reactions  . Abilify [Aripiprazole]     Restless legs  . Celexa [Citalopram Hydrobromide]     Agitation   . Clindamycin/Lincomycin   . Penicillins Other (See Comments)    Unknown Childhood reaction  . Zoloft [Sertraline Hcl]     Suicidal thoughts  . Amoxil [Amoxicillin] Rash   Current Medications:  Current Outpatient Prescriptions  Medication Sig Dispense Refill  . ALPRAZolam (XANAX) 0.5 MG tablet Take 1 tablet (0.5 mg total) by mouth 2 (two) times daily. 60 tablet 2  . lisdexamfetamine (VYVANSE) 60 MG capsule Take 1 capsule (60 mg total) by mouth every morning. 30 capsule 0  . PARoxetine (PAXIL) 40 MG  tablet Take 1 tablet (40 mg total) by mouth at bedtime. 30 tablet 2  . valACYclovir (VALTREX) 1000 MG tablet TAKE ONE (1) TABLET BY MOUTH EVERY DAY 30 tablet 6  . lisdexamfetamine (VYVANSE) 60 MG capsule Take 1 capsule (60 mg total) by mouth every morning. 30 capsule 0  . lisdexamfetamine (VYVANSE) 60 MG capsule Take 1 capsule (60 mg total) by mouth every morning. 30 capsule 0   Current Facility-Administered Medications  Medication Dose Route Frequency Provider Last Rate Last Dose  . etonogestrel (IMPLANON) implant 68 mg  68 mg Subcutaneous Once Jacklyn ShellFrances Cresenzo-Dishmon, CNM        Previous Psychotropic Medications:  Medication Dose   Zoloft, Effexor, Lamictal, Abilify, Xanax                        Substance Abuse History in the last 12 months: Substance Age of 1st Use Last Use Amount Specific Type  Nicotine    smokes one half  pack per day    Alcohol      Cannabis      Opiates      Cocaine      Methamphetamines      LSD      Ecstasy      Benzodiazepines      Caffeine      Inhalants      Others:                          Medical Consequences of Substance Abuse: n/a  Legal Consequences of Substance Abuse: n/a  Family Consequences of Substance Abuse: n/a  Blackouts:  No DT's:  No Withdrawal Symptoms:  No None  Social History: Current Place of Residence: [place] [address] of Birth: [place] Family Members: Parents, 2 sisters, boyfriend, son Marital Status:  Single Children:   Sons: 1  Daughters:  Relationships: Lives with boyfriend Education:  GED Educational Problems/Performance: Became too anxious and depressed to attend high school Religious Beliefs/Practices: Unknown History of Abuse: none Occupational Experiences; cleaning houses Military History:  None. Legal History: none Hobbies/Interests: Walking with baby, reading  Family History:   Family History  Problem Relation Age of Onset  . Heart disease    . Arthritis     . Cancer    . Diabetes    . Hypertension    . Depression Mother   . Bipolar disorder Sister   . Drug abuse Paternal Aunt     Mental Status Examination/Evaluation: Objective:  Appearance: Casual and Well Groomed  Eye Contact::  Good  Speech:  normal  Volume:normal  Mood: good  Affect:  bright  Thought Process:  Goal Directed  Orientation:  Full (Time, Place, and Person)  Thought Content: Within normal limits  Suicidal Thoughts:  No  Homicidal Thoughts:  No  Judgement:  Good  Insight:  Good  Psychomotor Activity: normal  Akathisia:  No  Handed:  Right  AIMS (if indicated):    Assets:  Communication Skills Desire for Improvement Social Support    Laboratory/X-Ray Psychological Evaluation(s)   Reviewed in record      Assessment:  Axis I: Generalized Anxiety Disorder, Major Depression, Recurrent severe and Obsessive Compulsive DisorderADD  AXIS I Generalized Anxiety Disorder, Major Depression, Recurrent severe and Obsessive Compulsive Disorder ADD  AXIS II Deferred  AXIS III Past Medical History  Diagnosis Date  . IBS (irritable bowel syndrome)   . Acid reflux disease   . Major depression   . OCD (obsessive compulsive disorder)   . Anxiety   . Interstitial cystitis   . Pregnancy   . 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 Abnormal pap   . History of gonorrhea 2013  . Reflux   . Reactive airway disease   . URI (upper respiratory infection)      AXIS IV other psychosocial or environmental problems  AXIS V 51-60 moderate symptoms   Treatment Plan/Recommendations:  Plan of Care: Medication management   Laboratory:   Psychotherapy: She'll be assigned a therapist here   Medications: She will continue Paxil 40 mg each bedtime and Xanax 0.5 mg twice a day as needed.she'll continueVyvanse to 60 mg each lunchtime so it will kick in before her classes start   Routine PRN Medications:  No  Consultations:   Safety Concerns:  She denies thoughts of  self-harm   Other:  She will return in 3 months     Aaden Buckman, Gavin Pound, MD  3/8/201611:37 AM

## 2014-12-07 ENCOUNTER — Ambulatory Visit (HOSPITAL_COMMUNITY): Payer: Self-pay | Admitting: Psychiatry

## 2015-02-15 ENCOUNTER — Encounter (HOSPITAL_COMMUNITY): Payer: Self-pay | Admitting: Psychiatry

## 2015-02-15 ENCOUNTER — Telehealth (HOSPITAL_COMMUNITY): Payer: Self-pay | Admitting: *Deleted

## 2015-02-15 ENCOUNTER — Ambulatory Visit (INDEPENDENT_AMBULATORY_CARE_PROVIDER_SITE_OTHER): Payer: Medicaid Other | Admitting: Psychiatry

## 2015-02-15 VITALS — BP 128/68 | Wt 148.0 lb

## 2015-02-15 DIAGNOSIS — F411 Generalized anxiety disorder: Secondary | ICD-10-CM

## 2015-02-15 DIAGNOSIS — F42 Obsessive-compulsive disorder: Secondary | ICD-10-CM

## 2015-02-15 DIAGNOSIS — F332 Major depressive disorder, recurrent severe without psychotic features: Secondary | ICD-10-CM

## 2015-02-15 DIAGNOSIS — F32A Depression, unspecified: Secondary | ICD-10-CM

## 2015-02-15 DIAGNOSIS — F909 Attention-deficit hyperactivity disorder, unspecified type: Secondary | ICD-10-CM | POA: Diagnosis not present

## 2015-02-15 DIAGNOSIS — F329 Major depressive disorder, single episode, unspecified: Secondary | ICD-10-CM

## 2015-02-15 MED ORDER — LISDEXAMFETAMINE DIMESYLATE 60 MG PO CAPS
60.0000 mg | ORAL_CAPSULE | ORAL | Status: DC
Start: 2015-02-15 — End: 2015-04-18

## 2015-02-15 MED ORDER — LISDEXAMFETAMINE DIMESYLATE 60 MG PO CAPS: 60.0000 mg | ORAL_CAPSULE | ORAL | Status: DC

## 2015-02-15 MED ORDER — ALPRAZOLAM 0.5 MG PO TABS: 0.5000 mg | ORAL_TABLET | Freq: Two times a day (BID) | ORAL | Status: DC

## 2015-02-15 MED ORDER — FLUOXETINE HCL 40 MG PO CAPS: 40.0000 mg | ORAL_CAPSULE | Freq: Every day | ORAL | Status: DC

## 2015-02-15 MED ORDER — LISDEXAMFETAMINE DIMESYLATE 60 MG PO CAPS
60.0000 mg | ORAL_CAPSULE | ORAL | Status: DC
Start: 2015-02-15 — End: 2015-03-31

## 2015-02-15 NOTE — Progress Notes (Signed)
Patient ID: Tracy Flores, female   DOB: March 31, 1991, 24 y.o.   MRN: [id number] Patient ID: Tracy Flores, female   DOB: 01-22-1991, 24 y.o.   MRN: [id number] Patient ID: Tracy Flores, female   DOB: 23-Dec-1990, 24 y.o.   MRN: [id number] Patient ID: Tracy Flores, female   DOB: [birth year], 24 y.o.   MRN: [id number] Patient ID: Tracy Flores, female   DOB: [birth year], 23 y.o.   MRN: [id number] Patient ID: Tracy Flores, female   DOB: Jun 23, 1991, 24 y.o.   MRN: [id number]  Psychiatric Assessment Adult  Patient Identification:  Tracy Flores Date of Evaluation:  02/15/2015 Chief Complaint: "I'm doing better." History of Chief Complaint:   Chief Complaint  Patient presents with  . ADHD  . Agitation  . Follow-up    Anxiety Symptoms include nervous/anxious behavior.     this patient is a 24 year old single white female who lives her boyfriend and  28 year old son in [place]. She works Education officer, environmental houses. She is referred by Dr.-Luking's office for assessment of depression and anxiety  The patient states that her difficulties with depression and panic attacks started at age 24. She felt like she was being bullied at school and had a hard time going to school. Sometimes she was too depressed to go out of bed. She was seeing a Veterinary surgeon and a psychiatrist in [place] and was tried on numerous medications. She's had negative reactions to several such as Zoloft not working Effexor making her worse Lamictal not working and Abilify causing restless legs.  In 2012 she had a particularly difficult year. She [facility], [facility]. Prior to her last hospitalization she had made a suicide attempt by trying to crash up her car. She did have a good response to Paxil and Xanax but stopped the medications when she became pregnant last year. Since having her baby she's been depressed again and the Paxil was restarted.  This helped for a while  but lately she's been more depressed and not finding much joy in things. She used to be on a higher dose of Paxil. She is sleeping fairly well but her energy is low she is tired all the time. She does spend time with her sister and friends but is very uncomfortable going out in public around crowds or attending community college. She has panic attacks about once a week. She denies being suicidal and claims that this stopped when she had a child and was responsible for another person. She's not using drugs or alcohol. She's never had psychotic symptoms or manic symptoms. However she tends to worry and ruminate about the same things over and over and is constantly cleaning her house.  The patient returns after 4 months. She missed an appointment and is now out of Vyvanse. She is started back at [employer]. She didn't get into the radiography program but she is going to complete her 2 year degree and try again next spring. She feels like she's been more depressed lately and isn't sure the Paxil is helping her. Her energy is low and I suggested we switch to Prozac and she agrees. When she is on the Vyvanse she is more focused and she only occasionally uses Xanax for anxiety Review of Systems  Constitutional: Positive for fatigue.  HENT: Negative.   Eyes: Negative.   Respiratory: Negative.   Cardiovascular: Negative.   Gastrointestinal: Negative.   Endocrine: Negative.  Genitourinary: Negative.   Musculoskeletal: Negative.   Allergic/Immunologic: Negative.   Neurological: Negative.   Hematological: Negative.   Psychiatric/Behavioral: Positive for dysphoric mood. The patient is nervous/anxious.    Physical Exam not done  Depressive Symptoms: depressed mood, anhedonia, psychomotor retardation, fatigue, difficulty concentrating, anxiety, panic attacks,  (Hypo) Manic Symptoms:   Elevated Mood:  No Irritable Mood:  Yes Grandiosity:  No Distractibility:  Yes Labiality of Mood:  No Delusions:   No Hallucinations:  No Impulsivity:  No Sexually Inappropriate Behavior:  No Financial Extravagance:  No Flight of Ideas:  No  Anxiety Symptoms: Excessive Worry:  Yes Panic Symptoms:  Yes Agoraphobia:  No Obsessive Compulsive: Yes  Symptoms: Cleans house compulsively Specific Phobias:  No Social Anxiety:  Yes  Psychotic Symptoms:  Hallucinations: No None Delusions:  No Paranoia:  No   Ideas of Reference:  No  PTSD Symptoms: Ever had a traumatic exposure:  No Had a traumatic exposure in the last month:  No Re-experiencing: No None Hypervigilance:  No Hyperarousal: No None Avoidance: No None  Traumatic Brain Injury: No   Past Psychiatric History: Diagnosis: Major depression, panic disorder, OCD   Hospitalizations: 3 times in 2012   Outpatient Care: Has seen a therapist in her teen years   Substance Abuse Care: none  Self-Mutilation: none  Suicidal Attempts: Once by trying to Crash her car   Violent Behaviors: none   Past Medical History:   Past Medical History  Diagnosis Date  . IBS (irritable bowel syndrome)   . Acid reflux disease   . Major depression   . OCD (obsessive compulsive disorder)   . Anxiety   . Interstitial cystitis   . Pregnancy   . 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 Abnormal pap   . History of gonorrhea 2013  . Reflux   . Reactive airway disease   . URI (upper respiratory infection)    History of Loss of Consciousness:  No Seizure History:  No Cardiac History:  No Allergies:   Allergies  Allergen Reactions  . Abilify [Aripiprazole]     Restless legs  . Celexa [Citalopram Hydrobromide]     Agitation   . Clindamycin/Lincomycin   . Penicillins Other (See Comments)    Unknown Childhood reaction  . Zoloft [Sertraline Hcl]     Suicidal thoughts  . Amoxil [Amoxicillin] Rash   Current Medications:  Current Outpatient Prescriptions  Medication Sig Dispense Refill  . ALPRAZolam (XANAX) 0.5 MG tablet Take 1 tablet (0.5  mg total) by mouth 2 (two) times daily. 60 tablet 2  . FLUoxetine (PROZAC) 40 MG capsule Take 1 capsule (40 mg total) by mouth daily. 30 capsule 2  . lisdexamfetamine (VYVANSE) 60 MG capsule Take 1 capsule (60 mg total) by mouth every morning. 30 capsule 0  . lisdexamfetamine (VYVANSE) 60 MG capsule Take 1 capsule (60 mg total) by mouth every morning. 30 capsule 0  . lisdexamfetamine (VYVANSE) 60 MG capsule Take 1 capsule (60 mg total) by mouth every morning. 30 capsule 0  . valACYclovir (VALTREX) 1000 MG tablet TAKE ONE (1) TABLET BY MOUTH EVERY DAY 30 tablet 6   Current Facility-Administered Medications  Medication Dose Route Frequency Provider Last Rate Last Dose  . etonogestrel (IMPLANON) implant 68 mg  68 mg Subcutaneous Once Jacklyn Shell, CNM        Previous Psychotropic Medications:  Medication Dose   Zoloft, Effexor, Lamictal, Abilify, Xanax  Substance Abuse History in the last 12 months: Substance Age of 1st Use Last Use Amount Specific Type  Nicotine    smokes one half pack per day    Alcohol      Cannabis      Opiates      Cocaine      Methamphetamines      LSD      Ecstasy      Benzodiazepines      Caffeine      Inhalants      Others:                          Medical Consequences of Substance Abuse: n/a  Legal Consequences of Substance Abuse: n/a  Family Consequences of Substance Abuse: n/a  Blackouts:  No DT's:  No Withdrawal Symptoms:  No None  Social History: Current Place of Residence: [place] [address] of Birth: [place] Family Members: Parents, 2 sisters, boyfriend, son Marital Status:  Single Children:   Sons: 1  Daughters:  Relationships: Lives with boyfriend Education:  GED Educational Problems/Performance: Became too anxious and depressed to attend high school Religious Beliefs/Practices: Unknown History of Abuse: none Occupational Experiences; cleaning  houses Military History:  None. Legal History: none Hobbies/Interests: Walking with baby, reading  Family History:   Family History  Problem Relation Age of Onset  . Heart disease    . Arthritis    . Cancer    . Diabetes    . Hypertension    . Depression Mother   . Bipolar disorder Sister   . Drug abuse Paternal Aunt     Mental Status Examination/Evaluation: Objective:  Appearance: Casual and Well Groomed  Eye Contact::  Good  Speech:  normal  Volume:normal  Mood: Fair a little down   Affect:  Somewhat constricted   Thought Process:  Goal Directed  Orientation:  Full (Time, Place, and Person)  Thought Content: Within normal limits  Suicidal Thoughts:  No  Homicidal Thoughts:  No  Judgement:  Good  Insight:  Good  Psychomotor Activity: normal  Akathisia:  No  Handed:  Right  AIMS (if indicated):    Assets:  Communication Skills Desire for Improvement Social Support    Laboratory/X-Ray Psychological Evaluation(s)   Reviewed in record      Assessment:  Axis I: Generalized Anxiety Disorder, Major Depression, Recurrent severe and Obsessive Compulsive DisorderADD  AXIS I Generalized Anxiety Disorder, Major Depression, Recurrent severe and Obsessive Compulsive Disorder ADD  AXIS II Deferred  AXIS III Past Medical History  Diagnosis Date  . IBS (irritable bowel syndrome)   . Acid reflux disease   . Major depression   . OCD (obsessive compulsive disorder)   . Anxiety   . Interstitial cystitis   . Pregnancy   . 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 Abnormal pap   . History of gonorrhea 2013  . Reflux   . Reactive airway disease   . URI (upper respiratory infection)      AXIS IV other psychosocial or environmental problems  AXIS V 51-60 moderate symptoms   Treatment Plan/Recommendations:  Plan of Care: Medication management   Laboratory:   Psychotherapy: She'll be assigned a therapist here   Medications: She will discontinue Paxil 40 mg and  start Prozac 40 mg daily for depression and continue Xanax 0.5 mg twice a day as needed for anxiety.she'll continueVyvanse to 60 mg each lunchtime so it will kick  in before her classes start for ADHD   Routine PRN Medications:  No  Consultations:   Safety Concerns:  She denies thoughts of self-harm   Other:  She will return in 3 months or call if she has difficulties with the switch to Prozac     Diannia Ruder, MD 8/17/20162:40 PM

## 2015-02-15 NOTE — Telephone Encounter (Signed)
pharmacy called regarding patient, she has been on Paxil, but they received e-script today for Prozac.    Please call to clarify instructions.

## 2015-02-16 NOTE — Telephone Encounter (Signed)
Spoke with Tracy Flores and informed them of what Dr. Tenny Craw wrote in Tracy Flores's chart.../or

## 2015-03-31 ENCOUNTER — Ambulatory Visit (INDEPENDENT_AMBULATORY_CARE_PROVIDER_SITE_OTHER): Payer: Medicaid Other | Admitting: Nurse Practitioner

## 2015-03-31 ENCOUNTER — Encounter: Payer: Self-pay | Admitting: Nurse Practitioner

## 2015-03-31 VITALS — BP 122/80 | Ht 67.5 in | Wt 147.5 lb

## 2015-03-31 DIAGNOSIS — R5383 Other fatigue: Secondary | ICD-10-CM | POA: Diagnosis not present

## 2015-03-31 DIAGNOSIS — Z79899 Other long term (current) drug therapy: Secondary | ICD-10-CM | POA: Diagnosis not present

## 2015-03-31 NOTE — Progress Notes (Signed)
Subjective:  Presents for c/o fatigue. Has been going on for a few months. History of recurrent depression. Under the care of Dr. Tenny Craw. Was switched to Prozac about 2 months ago. Requesting labwork for fatigue.  Objective:   BP 122/80 mmHg  Ht 5' 7.5" (1.715 m)  Wt 147 lb 8 oz (66.906 kg)  BMI 22.75 kg/m2 NAD. Alert, oriented. Thyroid: no masses or goiter noted. Non tender. Lungs clear. Heart RRR.   Assessment: Other fatigue - Plan: CBC with Differential/Platelet, Lipid panel, Hepatic function panel, Basic metabolic panel, TSH  High risk medication use - Plan: Hepatic function panel  Plan: Labs pending. Follow up with Dr. Tenny Craw as planned.

## 2015-04-01 ENCOUNTER — Encounter: Payer: Self-pay | Admitting: Nurse Practitioner

## 2015-04-01 LAB — CBC WITH DIFFERENTIAL/PLATELET
BASOS ABS: 0 10*3/uL (ref 0.0–0.2)
Basos: 0 %
EOS (ABSOLUTE): 0.2 10*3/uL (ref 0.0–0.4)
Eos: 3 %
Hematocrit: 41.1 % (ref 34.0–46.6)
Hemoglobin: 13.9 g/dL (ref 11.1–15.9)
IMMATURE GRANS (ABS): 0 10*3/uL (ref 0.0–0.1)
Immature Granulocytes: 0 %
LYMPHS: 45 %
Lymphocytes Absolute: 2.9 10*3/uL (ref 0.7–3.1)
MCH: 29.8 pg (ref 26.6–33.0)
MCHC: 33.8 g/dL (ref 31.5–35.7)
MCV: 88 fL (ref 79–97)
MONOS ABS: 0.7 10*3/uL (ref 0.1–0.9)
Monocytes: 11 %
NEUTROS PCT: 41 %
Neutrophils Absolute: 2.7 10*3/uL (ref 1.4–7.0)
PLATELETS: 303 10*3/uL (ref 150–379)
RBC: 4.66 x10E6/uL (ref 3.77–5.28)
RDW: 14.1 % (ref 12.3–15.4)
WBC: 6.5 10*3/uL (ref 3.4–10.8)

## 2015-04-01 LAB — TSH: TSH: 0.91 u[IU]/mL (ref 0.450–4.500)

## 2015-04-01 LAB — BASIC METABOLIC PANEL
BUN/Creatinine Ratio: 13 (ref 8–20)
BUN: 10 mg/dL (ref 6–20)
CALCIUM: 9.6 mg/dL (ref 8.7–10.2)
CHLORIDE: 102 mmol/L (ref 97–108)
CO2: 23 mmol/L (ref 18–29)
Creatinine, Ser: 0.77 mg/dL (ref 0.57–1.00)
GFR calc Af Amer: 124 mL/min/{1.73_m2} (ref >59)
GFR calc non Af Amer: 108 mL/min/{1.73_m2} (ref >59)
Glucose: 92 mg/dL (ref 65–99)
POTASSIUM: 4.7 mmol/L (ref 3.5–5.2)
Sodium: 140 mmol/L (ref 134–144)

## 2015-04-01 LAB — HEPATIC FUNCTION PANEL
ALT: 12 IU/L (ref 0–32)
AST: 13 IU/L (ref 0–40)
Albumin: 4.7 g/dL (ref 3.5–5.5)
Alkaline Phosphatase: 61 IU/L (ref 39–117)
Bilirubin Total: 0.3 mg/dL (ref 0.0–1.2)
Bilirubin, Direct: 0.08 mg/dL (ref 0.00–0.40)
Total Protein: 7.2 g/dL (ref 6.0–8.5)

## 2015-04-01 LAB — LIPID PANEL
CHOLESTEROL TOTAL: 149 mg/dL (ref 100–199)
Chol/HDL Ratio: 2.7 ratio units (ref 0.0–4.4)
HDL: 55 mg/dL (ref >39)
LDL CALC: 83 mg/dL (ref 0–99)
TRIGLYCERIDES: 54 mg/dL (ref 0–149)
VLDL CHOLESTEROL CAL: 11 mg/dL (ref 5–40)

## 2015-04-14 ENCOUNTER — Telehealth (HOSPITAL_COMMUNITY): Payer: Self-pay | Admitting: *Deleted

## 2015-04-14 NOTE — Telephone Encounter (Signed)
phone call from patient, she said she is anxious all the time, tired, or she can't go to sleep.

## 2015-04-18 ENCOUNTER — Ambulatory Visit (INDEPENDENT_AMBULATORY_CARE_PROVIDER_SITE_OTHER): Payer: Medicaid Other | Admitting: Psychiatry

## 2015-04-18 ENCOUNTER — Encounter (HOSPITAL_COMMUNITY): Payer: Self-pay | Admitting: Psychiatry

## 2015-04-18 VITALS — BP 129/77 | HR 98 | Ht 67.5 in | Wt 149.2 lb

## 2015-04-18 DIAGNOSIS — F411 Generalized anxiety disorder: Secondary | ICD-10-CM

## 2015-04-18 DIAGNOSIS — F32A Depression, unspecified: Secondary | ICD-10-CM

## 2015-04-18 DIAGNOSIS — F429 Obsessive-compulsive disorder, unspecified: Secondary | ICD-10-CM | POA: Diagnosis not present

## 2015-04-18 DIAGNOSIS — F329 Major depressive disorder, single episode, unspecified: Secondary | ICD-10-CM | POA: Diagnosis not present

## 2015-04-18 DIAGNOSIS — F913 Oppositional defiant disorder: Secondary | ICD-10-CM

## 2015-04-18 MED ORDER — LISDEXAMFETAMINE DIMESYLATE 60 MG PO CAPS: 60.0000 mg | ORAL_CAPSULE | ORAL | Status: DC

## 2015-04-18 MED ORDER — VILAZODONE HCL 40 MG PO TABS: 40.0000 mg | ORAL_TABLET | Freq: Every day | ORAL | Status: DC

## 2015-04-18 MED ORDER — DIAZEPAM 2 MG PO TABS
2.0000 mg | ORAL_TABLET | Freq: Two times a day (BID) | ORAL | Status: DC | PRN
Start: 2015-04-18 — End: 2015-04-18

## 2015-04-18 MED ORDER — LORAZEPAM 0.5 MG PO TABS: 0.5000 mg | ORAL_TABLET | Freq: Two times a day (BID) | ORAL | Status: DC | PRN

## 2015-04-18 NOTE — Progress Notes (Signed)
Patient ID: Tracy Flores, female   DOB: 1990-11-02, 24 y.o.   MRN: [id number] Patient ID: Tracy Flores, female   DOB: 1990/12/31, 24 y.o.   MRN: [id number] Patient ID: Tracy Flores, female   DOB: 10-11-1990, 24 y.o.   MRN: [id number] Patient ID: Tracy Flores, female   DOB: 1990-12-19, 24 y.o.   MRN: [id number] Patient ID: Tracy Flores, female   DOB: 21-Dec-1990, 24 y.o.   MRN: [id number] Patient ID: Tracy Flores, female   DOB: 05/25/1991, 24 y.o.   MRN: [id number] Patient ID: Tracy Flores, female   DOB: May 10, 1991, 24 y.o.   MRN: [id number]  Psychiatric Assessment Adult  Patient Identification:  Tracy Flores Date of Evaluation:  04/18/2015 Chief Complaint: "I'm depressed." History of Chief Complaint:   Chief Complaint  Patient presents with  . Depression  . Anxiety  . Follow-up    Depression        Associated symptoms include fatigue.  Past medical history includes anxiety.   Anxiety Symptoms include nervous/anxious behavior.     this patient is a 55 year old single white female who lives her boyfriend and  7 year old son in [place]. She works Education officer, environmental houses. She is referred by Dr.-Luking's office for assessment of depression and anxiety  The patient states that her difficulties with depression and panic attacks started at age 49. She felt like she was being bullied at school and had a hard time going to school. Sometimes she was too depressed to go out of bed. She was seeing a Veterinary surgeon and a psychiatrist in [place] and was tried on numerous medications. She's had negative reactions to several such as Zoloft not working Effexor making her worse Lamictal not working and Abilify causing restless legs.  In 2012 she had a particularly difficult year. She was hospitalized 3 times, once at the [facility] and twice at [facility]. Prior to her last hospitalization she had made a suicide attempt by trying to crash up her car. She did have a good response to Paxil  and Xanax but stopped the medications when she became pregnant last year. Since having her baby she's been depressed again and the Paxil was restarted.  This helped for a while but lately she's been more depressed and not finding much joy in things. She used to be on a higher dose of Paxil. She is sleeping fairly well but her energy is low she is tired all the time. She does spend time with her sister and friends but is very uncomfortable going out in public around crowds or attending community college. She has panic attacks about once a week. She denies being suicidal and claims that this stopped when she had a child and was responsible for another person. She's not using drugs or alcohol. She's never had psychotic symptoms or manic symptoms. However she tends to worry and ruminate about the same things over and over and is constantly cleaning her house.  The patient returns after 2 months. Last time she stated she was more depressed and I changed her Paxil to Prozac. She's been taking it every day but she is more depressed than ever. She is doing fairly well in school but she feels anxious a lot of the time and the Xanax isn't really helping. She can't relate any new stressors. She has already tried Cymbalta and Paxil so I suggested we try Viibryd because of the anxiety. She denies suicidal ideation. I also suggested we switch  the Xanax to Ativan. Review of Systems  Constitutional: Positive for fatigue.  HENT: Negative.   Eyes: Negative.   Respiratory: Negative.   Cardiovascular: Negative.   Gastrointestinal: Negative.   Endocrine: Negative.   Genitourinary: Negative.   Musculoskeletal: Negative.   Allergic/Immunologic: Negative.   Neurological: Negative.   Hematological: Negative.   Psychiatric/Behavioral: Positive for depression and dysphoric mood. The patient is nervous/anxious.    Physical Exam not done  Depressive Symptoms: depressed mood, anhedonia, psychomotor  retardation, fatigue, difficulty concentrating, anxiety, panic attacks,  (Hypo) Manic Symptoms:   Elevated Mood:  No Irritable Mood:  Yes Grandiosity:  No Distractibility:  Yes Labiality of Mood:  No Delusions:  No Hallucinations:  No Impulsivity:  No Sexually Inappropriate Behavior:  No Financial Extravagance:  No Flight of Ideas:  No  Anxiety Symptoms: Excessive Worry:  Yes Panic Symptoms:  Yes Agoraphobia:  No Obsessive Compulsive: Yes  Symptoms: Cleans house compulsively Specific Phobias:  No Social Anxiety:  Yes  Psychotic Symptoms:  Hallucinations: No None Delusions:  No Paranoia:  No   Ideas of Reference:  No  PTSD Symptoms: Ever had a traumatic exposure:  No Had a traumatic exposure in the last month:  No Re-experiencing: No None Hypervigilance:  No Hyperarousal: No None Avoidance: No None  Traumatic Brain Injury: No   Past Psychiatric History: Diagnosis: Major depression, panic disorder, OCD   Hospitalizations: 3 times in 2012   Outpatient Care: Has seen a therapist in her teen years   Substance Abuse Care: none  Self-Mutilation: none  Suicidal Attempts: Once by trying to Crash her car   Violent Behaviors: none   Past Medical History:   Past Medical History  Diagnosis Date  . IBS (irritable bowel syndrome)   . Acid reflux disease   . Major depression (HCC)   . OCD (obsessive compulsive disorder)   . Anxiety   . Interstitial cystitis   . Pregnancy   . 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 Abnormal pap   . History of gonorrhea 2013  . Reflux   . Reactive airway disease   . URI (upper respiratory infection)    History of Loss of Consciousness:  No Seizure History:  No Cardiac History:  No Allergies:   Allergies  Allergen Reactions  . Abilify [Aripiprazole]     Restless legs  . Celexa [Citalopram Hydrobromide]     Agitation   . Clindamycin/Lincomycin   . Penicillins Other (See Comments)    Unknown Childhood reaction   . Zoloft [Sertraline Hcl]     Suicidal thoughts  . Amoxil [Amoxicillin] Rash   Current Medications:  Current Outpatient Prescriptions  Medication Sig Dispense Refill  . FLUoxetine (PROZAC) 40 MG capsule Take 1 capsule (40 mg total) by mouth daily. 30 capsule 2  . lisdexamfetamine (VYVANSE) 60 MG capsule Take 1 capsule (60 mg total) by mouth every morning. 30 capsule 0  . valACYclovir (VALTREX) 1000 MG tablet TAKE ONE (1) TABLET BY MOUTH EVERY DAY 30 tablet 6  . LORazepam (ATIVAN) 0.5 MG tablet Take 1 tablet (0.5 mg total) by mouth 2 (two) times daily as needed for anxiety. 60 tablet 0  . Vilazodone HCl (VIIBRYD) 40 MG TABS Take 1 tablet (40 mg total) by mouth daily. 30 tablet 2   Current Facility-Administered Medications  Medication Dose Route Frequency Provider Last Rate Last Dose  . etonogestrel (IMPLANON) implant 68 mg  68 mg Subcutaneous Once Jacklyn ShellFrances Cresenzo-Dishmon, CNM        Previous  Psychotropic Medications:  Medication Dose   Zoloft, Effexor, Lamictal, Abilify, Xanax                        Substance Abuse History in the last 12 months: Substance Age of 1st Use Last Use Amount Specific Type  Nicotine    smokes one half pack per day    Alcohol      Cannabis      Opiates      Cocaine      Methamphetamines      LSD      Ecstasy      Benzodiazepines      Caffeine      Inhalants      Others:                          Medical Consequences of Substance Abuse: n/a  Legal Consequences of Substance Abuse: n/a  Family Consequences of Substance Abuse: n/a  Blackouts:  No DT's:  No Withdrawal Symptoms:  No None  Social History: Current Place of Residence:  [address] of Birth: [place] Family Members: Parents, 2 sisters, boyfriend, son Marital Status:  Single Children:   Sons: 1  Daughters:  Relationships: Lives with boyfriend Education:  GED Educational Problems/Performance: Became too anxious and depressed to  attend high school Religious Beliefs/Practices: Unknown History of Abuse: none Occupational Experiences; cleaning houses Military History:  None. Legal History: none Hobbies/Interests: Walking with baby, reading  Family History:   Family History  Problem Relation Age of Onset  . Heart disease    . Arthritis    . Cancer    . Diabetes    . Hypertension    . Depression Mother   . Bipolar disorder Sister   . Drug abuse Paternal Aunt     Mental Status Examination/Evaluation: Objective:  Appearance: Casual and Well Groomed  Eye Contact::  Good  Speech:  normal  Volume:normal  Mood: Depressed   Affect:  Anxious, constricted, holding back tears   Thought Process:  Goal Directed  Orientation:  Full (Time, Place, and Person)  Thought Content: Within normal limits  Suicidal Thoughts:  No  Homicidal Thoughts:  No  Judgement:  Good  Insight:  Good  Psychomotor Activity: normal  Akathisia:  No  Handed:  Right  AIMS (if indicated):    Assets:  Communication Skills Desire for Improvement Social Support    Laboratory/X-Ray Psychological Evaluation(s)   Reviewed in record      Assessment:  Axis I: Generalized Anxiety Disorder, Major Depression, Recurrent severe and Obsessive Compulsive DisorderADD  AXIS I Generalized Anxiety Disorder, Major Depression, Recurrent severe and Obsessive Compulsive Disorder ADD  AXIS II Deferred  AXIS III Past Medical History  Diagnosis Date  . IBS (irritable bowel syndrome)   . Acid reflux disease   . Major depression (HCC)   . OCD (obsessive compulsive disorder)   . Anxiety   . Interstitial cystitis   . Pregnancy   . 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 Abnormal pap   . History of gonorrhea 2013  . Reflux   . Reactive airway disease   . URI (upper respiratory infection)      AXIS IV other psychosocial or environmental problems  AXIS V 51-60 moderate symptoms   Treatment Plan/Recommendations:  Plan of Care: Medication  management   Laboratory:   Psychotherapy: She'll be assigned a therapist here   Medications:  She will discontinue  40 mg daily for depression and start Viibryd. She will start with 10 mg then 20 and eventually go to 40 mg daily. Samples have been given. She will discontinue Xanax 0.5 mg twice a day as needed for anxiety and start Ativan 0.5 mg twice a day.she'll continueVyvanse to 60 mg each lunchtime so it will kick in before her classes start for ADHD   Routine PRN Medications:  No  Consultations:   Safety Concerns:  She denies thoughts of self-harm   Other:  She will return in 4 weeks     Diannia Ruder, MD 10/18/201611:47 AM

## 2015-04-18 NOTE — Telephone Encounter (Signed)
Pt made appt and is in for office visit

## 2015-04-26 ENCOUNTER — Telehealth (HOSPITAL_COMMUNITY): Payer: Self-pay | Admitting: *Deleted

## 2015-04-26 NOTE — Telephone Encounter (Signed)
Prior authorization received for Viibryd. Called [phone] [facility] with [facility] who gave approval [id number] good until 04/20/16. Called to notify pharmacy.

## 2015-04-26 NOTE — Telephone Encounter (Signed)
noted 

## 2015-05-17 ENCOUNTER — Ambulatory Visit (HOSPITAL_COMMUNITY): Payer: Self-pay | Admitting: Psychiatry

## 2015-05-18 ENCOUNTER — Ambulatory Visit (INDEPENDENT_AMBULATORY_CARE_PROVIDER_SITE_OTHER): Payer: Medicaid Other | Admitting: Psychiatry

## 2015-05-18 ENCOUNTER — Encounter (HOSPITAL_COMMUNITY): Payer: Self-pay | Admitting: Psychiatry

## 2015-05-18 VITALS — BP 115/79 | HR 107 | Ht 67.5 in | Wt 147.6 lb

## 2015-05-18 DIAGNOSIS — F429 Obsessive-compulsive disorder, unspecified: Secondary | ICD-10-CM | POA: Diagnosis not present

## 2015-05-18 DIAGNOSIS — F411 Generalized anxiety disorder: Secondary | ICD-10-CM | POA: Diagnosis not present

## 2015-05-18 DIAGNOSIS — F329 Major depressive disorder, single episode, unspecified: Secondary | ICD-10-CM

## 2015-05-18 DIAGNOSIS — F909 Attention-deficit hyperactivity disorder, unspecified type: Secondary | ICD-10-CM

## 2015-05-18 DIAGNOSIS — F32A Depression, unspecified: Secondary | ICD-10-CM

## 2015-05-18 MED ORDER — LORAZEPAM 1 MG PO TABS: 1.0000 mg | ORAL_TABLET | Freq: Three times a day (TID) | ORAL | Status: DC

## 2015-05-18 MED ORDER — VILAZODONE HCL 40 MG PO TABS: 40.0000 mg | ORAL_TABLET | Freq: Every day | ORAL | Status: DC

## 2015-05-18 MED ORDER — AMPHETAMINE-DEXTROAMPHETAMINE 20 MG PO TABS: 20.0000 mg | ORAL_TABLET | Freq: Two times a day (BID) | ORAL | Status: DC

## 2015-05-18 NOTE — Progress Notes (Signed)
Patient ID: Tracy Flores, female   DOB: 12/27/1990, 24 y.o.   MRN: [id number] Patient ID: Tracy Flores, female   DOB: 11/10/1990, 24 y.o.   MRN: [id number] Patient ID: Tracy Flores, female   DOB: 03/14/1991, 24 y.o.   MRN: [id number] Patient ID: Tracy Flores, female   DOB: 08/27/1990, 24 y.o.   MRN: [id number] Patient ID: Tracy Flores, female   DOB: 05/19/1991, 24 y.o.   MRN: [id number] Patient ID: Tracy Flores, female   DOB: 01/26/1991, [age] y.o.   MRN: [id number] Patient ID: Tracy Flores, female   DOB: 02/26/1991, 24 y.o.   MRN: [id number] Patient ID: Tracy Flores, female   DOB: 11/05/1990, 24 y.o.   MRN: [id number]  Psychiatric Assessment Adult  Patient Identification:  Tracy Flores Date of Evaluation:  05/18/2015 Chief Complaint: "I'm depressed." History of Chief Complaint:   Chief Complaint  Patient presents with  . Depression  . Anxiety  . Follow-up    Depression        Associated symptoms include fatigue.  Past medical history includes anxiety.   Anxiety Symptoms include nervous/anxious behavior.     this patient is a 89 year old single white female who lives her boyfriend and  199 year old son in [place]. She works Education officer, environmentalcleaning houses. She is referred by Dr.-Luking's office for assessment of depression and anxiety  The patient states that her difficulties with depression and panic attacks started at age 24. She felt like she was being bullied at school and had a hard time going to school. Sometimes she was too depressed to go out of bed. She was seeing a Veterinary surgeoncounselor and a psychiatrist in [place] and was tried on numerous medications. She's had negative reactions to several such as Zoloft not working Effexor making her worse Lamictal not working and Abilify causing restless legs.  In 2012 she had a particularly difficult year. She was hospitalized 3 times, once at the [facility] and twice at old [facility]. Prior to her last hospitalization she had made a  suicide attempt by trying to crash up her car. She did have a good response to Paxil and Xanax but stopped the medications when she became pregnant last year. Since having her baby she's been depressed again and the Paxil was restarted.  This helped for a while but lately she's been more depressed and not finding much joy in things. She used to be on a higher dose of Paxil. She is sleeping fairly well but her energy is low she is tired all the time. She does spend time with her sister and friends but is very uncomfortable going out in public around crowds or attending community college. She has panic attacks about once a week. She denies being suicidal and claims that this stopped when she had a child and was responsible for another person. She's not using drugs or alcohol. She's never had psychotic symptoms or manic symptoms. However she tends to worry and ruminate about the same things over and over and is constantly cleaning her house.  The patient returns after 4 weeks. She states that the Viibryd starting to help her depression just a little bit. However she can't focus and she is doing poorly in her classes. She's feels overwhelmed and anxious all the time and can't give a reason for it. The Ativan at 0.5 mg wasn't helping that when she took to it helped a little bit more so we obviously need to increase it.  The Vyvanse is no longer helping her stay focused. She tried Concerta in the past which did not work. I suggested we try Adderall and space it out through the day to cover her classes and homework and she agrees. She denies being suicidal but does look sad and overwhelmed. She is also suffering with allergies and I strongly suggested she call her primary care provider Review of Systems  Constitutional: Positive for fatigue.  HENT: Negative.   Eyes: Negative.   Respiratory: Negative.   Cardiovascular: Negative.   Gastrointestinal: Negative.   Endocrine: Negative.   Genitourinary: Negative.    Musculoskeletal: Negative.   Allergic/Immunologic: Negative.   Neurological: Negative.   Hematological: Negative.   Psychiatric/Behavioral: Positive for depression and dysphoric mood. The patient is nervous/anxious.    Physical Exam not done  Depressive Symptoms: depressed mood, anhedonia, psychomotor retardation, fatigue, difficulty concentrating, anxiety, panic attacks,  (Hypo) Manic Symptoms:   Elevated Mood:  No Irritable Mood:  Yes Grandiosity:  No Distractibility:  Yes Labiality of Mood:  No Delusions:  No Hallucinations:  No Impulsivity:  No Sexually Inappropriate Behavior:  No Financial Extravagance:  No Flight of Ideas:  No  Anxiety Symptoms: Excessive Worry:  Yes Panic Symptoms:  Yes Agoraphobia:  No Obsessive Compulsive: Yes  Symptoms: Cleans house compulsively Specific Phobias:  No Social Anxiety:  Yes  Psychotic Symptoms:  Hallucinations: No None Delusions:  No Paranoia:  No   Ideas of Reference:  No  PTSD Symptoms: Ever had a traumatic exposure:  No Had a traumatic exposure in the last month:  No Re-experiencing: No None Hypervigilance:  No Hyperarousal: No None Avoidance: No None  Traumatic Brain Injury: No   Past Psychiatric History: Diagnosis: Major depression, panic disorder, OCD   Hospitalizations: 3 times in 2012   Outpatient Care: Has seen a therapist in her teen years   Substance Abuse Care: none  Self-Mutilation: none  Suicidal Attempts: Once by trying to Crash her car   Violent Behaviors: none   Past Medical History:   Past Medical History  Diagnosis Date  . IBS (irritable bowel syndrome)   . Acid reflux disease   . Major depression (HCC)   . OCD (obsessive compulsive disorder)   . Anxiety   . Interstitial cystitis   . Pregnancy   . 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 Abnormal pap   . History of gonorrhea 2013  . Reflux   . Reactive airway disease   . URI (upper respiratory infection)    History  of Loss of Consciousness:  No Seizure History:  No Cardiac History:  No Allergies:   Allergies  Allergen Reactions  . Abilify [Aripiprazole]     Restless legs  . Celexa [Citalopram Hydrobromide]     Agitation   . Clindamycin/Lincomycin   . Penicillins Other (See Comments)    Unknown Childhood reaction  . Zoloft [Sertraline Hcl]     Suicidal thoughts  . Amoxil [Amoxicillin] Rash   Current Medications:  Current Outpatient Prescriptions  Medication Sig Dispense Refill  . valACYclovir (VALTREX) 1000 MG tablet TAKE ONE (1) TABLET BY MOUTH EVERY DAY 30 tablet 6  . Vilazodone HCl (VIIBRYD) 40 MG TABS Take 1 tablet (40 mg total) by mouth daily. 30 tablet 2  . amphetamine-dextroamphetamine (ADDERALL) 20 MG tablet Take 1 tablet (20 mg total) by mouth 2 (two) times daily. 60 tablet 0  . LORazepam (ATIVAN) 1 MG tablet Take 1 tablet (1 mg total) by mouth 3 (three) times daily.  90 tablet 2   Current Facility-Administered Medications  Medication Dose Route Frequency Provider Last Rate Last Dose  . etonogestrel (IMPLANON) implant 68 mg  68 mg Subcutaneous Once Jacklyn Shell, CNM        Previous Psychotropic Medications:  Medication Dose   Zoloft, Effexor, Lamictal, Abilify, Xanax                        Substance Abuse History in the last 12 months: Substance Age of 1st Use Last Use Amount Specific Type  Nicotine    smokes one half pack per day    Alcohol      Cannabis      Opiates      Cocaine      Methamphetamines      LSD      Ecstasy      Benzodiazepines      Caffeine      Inhalants      Others:                          Medical Consequences of Substance Abuse: n/a  Legal Consequences of Substance Abuse: n/a  Family Consequences of Substance Abuse: n/a  Blackouts:  No DT's:  No Withdrawal Symptoms:  No None  Social History: Current Place of Residence: [place] [address] of Birth: [place] Family Members: Parents, 2  sisters, boyfriend, son Marital Status:  Single Children:   Sons: 1  Daughters:  Relationships: Lives with boyfriend Education:  GED Educational Problems/Performance: Became too anxious and depressed to attend high school Religious Beliefs/Practices: Unknown History of Abuse: none Occupational Experiences; cleaning houses Military History:  None. Legal History: none Hobbies/Interests: Walking with baby, reading  Family History:   Family History  Problem Relation Age of Onset  . Heart disease    . Arthritis    . Cancer    . Diabetes    . Hypertension    . Depression Mother   . Bipolar disorder Sister   . Drug abuse Paternal Aunt     Mental Status Examination/Evaluation: Objective:  Appearance: Casual and Well Groomed appears tired   Eye Contact::  Good  Speech:  normal  Volume:normal  Mood: Depressed   Affect:  Anxious, constricted, holding back tears   Thought Process:  Goal Directed  Orientation:  Full (Time, Place, and Person)  Thought Content: Within normal limits  Suicidal Thoughts:  No  Homicidal Thoughts:  No  Judgement:  Good  Insight:  Good  Psychomotor Activity: normal  Akathisia:  No  Handed:  Right  AIMS (if indicated):    Assets:  Communication Skills Desire for Improvement Social Support    Laboratory/X-Ray Psychological Evaluation(s)   Reviewed in record      Assessment:  Axis I: Generalized Anxiety Disorder, Major Depression, Recurrent severe and Obsessive Compulsive DisorderADD  AXIS I Generalized Anxiety Disorder, Major Depression, Recurrent severe and Obsessive Compulsive Disorder ADD  AXIS II Deferred  AXIS III Past Medical History  Diagnosis Date  . IBS (irritable bowel syndrome)   . Acid reflux disease   . Major depression (HCC)   . OCD (obsessive compulsive disorder)   . Anxiety   . Interstitial cystitis   . Pregnancy   . 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 Abnormal pap   . History of gonorrhea 2013  . Reflux    . Reactive airway disease   . URI (upper respiratory  infection)      AXIS IV other psychosocial or environmental problems  AXIS V 51-60 moderate symptoms   Treatment Plan/Recommendations:  Plan of Care: Medication management   Laboratory:   Psychotherapy: She'll be assigned a therapist here   Medications: She will continue Viibryd 40 mg daily for depression She will increase Ativan to 1 mg 3 times a day for anxiety and discontinue Vyvanse. She will start Adderall 20 mg twice a day for ADD symptoms   Routine PRN Medications:  No  Consultations:   Safety Concerns:  She denies thoughts of self-harm   Other:  She will return in 4 weeks     Diannia Ruder, MD 11/17/20164:14 PM

## 2015-05-29 ENCOUNTER — Encounter: Payer: Self-pay | Admitting: Nurse Practitioner

## 2015-05-29 ENCOUNTER — Ambulatory Visit (INDEPENDENT_AMBULATORY_CARE_PROVIDER_SITE_OTHER): Payer: Medicaid Other | Admitting: Nurse Practitioner

## 2015-05-29 ENCOUNTER — Telehealth: Payer: Self-pay | Admitting: Nurse Practitioner

## 2015-05-29 VITALS — BP 120/72 | Ht 67.5 in | Wt 151.0 lb

## 2015-05-29 DIAGNOSIS — J329 Chronic sinusitis, unspecified: Secondary | ICD-10-CM

## 2015-05-29 DIAGNOSIS — G4700 Insomnia, unspecified: Secondary | ICD-10-CM | POA: Diagnosis not present

## 2015-05-29 MED ORDER — LORATADINE 10 MG PO TABS: 10.0000 mg | ORAL_TABLET | Freq: Every day | ORAL | Status: DC

## 2015-05-29 MED ORDER — AZITHROMYCIN 250 MG PO TABS: ORAL_TABLET | ORAL | Status: DC

## 2015-05-29 MED ORDER — FLUTICASONE PROPIONATE 50 MCG/ACT NA SUSP
2.0000 | Freq: Every day | NASAL | Status: DC
Start: 2015-05-29 — End: 2015-10-25

## 2015-05-29 NOTE — Telephone Encounter (Signed)
Patient was given appointment with Tracy Flores for today per Tracy Flores.

## 2015-05-29 NOTE — Telephone Encounter (Signed)
Tracy JonesCarolyn  Pts family calling to see if there is any way you would see her today.  She has agreed to see you only at this point, is refusing to go anywhere else. She can hardly get out of bed, when she does she is in a complete rage and very Violent. Family states she is having a complete break down at this point. The  specialist she is seeing for her depressive state is just loading her up on meds  An then telling her to come back in a month. Family feels she needs to be followed Closer than this, especially when after 2 wks on the med she is still feeling like she  Would rather die than live.   Please advise

## 2015-06-02 ENCOUNTER — Encounter: Payer: Self-pay | Admitting: Nurse Practitioner

## 2015-06-02 DIAGNOSIS — G4700 Insomnia, unspecified: Secondary | ICD-10-CM | POA: Insufficient documentation

## 2015-06-02 NOTE — Progress Notes (Signed)
Subjective:  Presents with her mother for complaints of sleep disturbance. Regular follow-up with psychiatry. Has an appointment with Dr. Tenny Crawoss at the end of December. Has been on Vibryd for the past 2 weeks, this seems to be working well. The problem now is that she's having trouble sleeping at nighttime and it's difficult to wake up in the morning. Her mother describes as a rage in the morning if you try to wake her up. Does not have any problems other than this. Takes Ativan 1 in the morning one in the afternoon. Has been prescribed for 3 times a day use if needed. Also complaints of sinus congestion and facial area headache for the past week. No fever sore throat or ear pain. Occasional cough. No fever.  Objective:   BP 120/72 mmHg  Ht 5' 7.5" (1.715 m)  Wt 151 lb (68.493 kg)  BMI 23.29 kg/m2 NAD. Alert, oriented. Cheerful affect. Thoughts logical coherent and relevant. Dressed appropriately. TMs clear effusion, no erythema. Pharynx nonerythematous with PND noted. Neck supple with mild soft anterior adenopathy. Lungs clear. Heart regular rate rhythm.  Assessment:  Problem List Items Addressed This Visit      Other   Insomnia - Primary    Other Visit Diagnoses    Rhinosinusitis        Relevant Medications    azithromycin (ZITHROMAX Z-PAK) 250 MG tablet    loratadine (CLARITIN) 10 MG tablet    fluticasone (FLONASE) 50 MCG/ACT nasal spray      Plan:  Meds ordered this encounter  Medications  . azithromycin (ZITHROMAX Z-PAK) 250 MG tablet    Sig: Take 2 tablets (500 mg) on  Day 1,  followed by 1 tablet (250 mg) once daily on Days 2 through 5.    Dispense:  6 each    Refill:  0    Order Specific Question:  Supervising Provider    Answer:  Merlyn AlbertLUKING, WILLIAM S [[id number]]  . loratadine (CLARITIN) 10 MG tablet    Sig: Take 1 tablet (10 mg total) by mouth daily.    Dispense:  30 tablet    Refill:  11    Order Specific Question:  Supervising Provider    Answer:  Merlyn AlbertLUKING, WILLIAM S [[id number]]  .  fluticasone (FLONASE) 50 MCG/ACT nasal spray    Sig: Place 2 sprays into both nostrils daily.    Dispense:  16 g    Refill:  6    Order Specific Question:  Supervising Provider    Answer:  Merlyn AlbertLUKING, WILLIAM S [[id number]]   Take her third Ativan about an hour before bedtime at least 4 hours between doses. Try this for one week and call back to our office or psychiatry if no improvement. Call back if sinus symptoms worsen or persist.

## 2015-06-30 ENCOUNTER — Encounter (HOSPITAL_COMMUNITY): Payer: Self-pay | Admitting: Psychiatry

## 2015-06-30 ENCOUNTER — Ambulatory Visit (INDEPENDENT_AMBULATORY_CARE_PROVIDER_SITE_OTHER): Payer: Medicaid Other | Admitting: Psychiatry

## 2015-06-30 VITALS — BP 104/63 | HR 82 | Ht 67.5 in | Wt 142.4 lb

## 2015-06-30 DIAGNOSIS — F429 Obsessive-compulsive disorder, unspecified: Secondary | ICD-10-CM

## 2015-06-30 DIAGNOSIS — F411 Generalized anxiety disorder: Secondary | ICD-10-CM | POA: Diagnosis not present

## 2015-06-30 DIAGNOSIS — F988 Other specified behavioral and emotional disorders with onset usually occurring in childhood and adolescence: Secondary | ICD-10-CM

## 2015-06-30 DIAGNOSIS — F329 Major depressive disorder, single episode, unspecified: Secondary | ICD-10-CM

## 2015-06-30 DIAGNOSIS — F32A Depression, unspecified: Secondary | ICD-10-CM

## 2015-06-30 MED ORDER — AMPHETAMINE-DEXTROAMPHETAMINE 20 MG PO TABS: 20.0000 mg | ORAL_TABLET | Freq: Two times a day (BID) | ORAL | Status: DC

## 2015-06-30 MED ORDER — PAROXETINE HCL 40 MG PO TABS: 40.0000 mg | ORAL_TABLET | Freq: Every day | ORAL | Status: DC

## 2015-06-30 MED ORDER — LORAZEPAM 1 MG PO TABS
1.0000 mg | ORAL_TABLET | Freq: Three times a day (TID) | ORAL | Status: DC
Start: 2015-06-30 — End: 2015-11-10

## 2015-06-30 NOTE — Progress Notes (Signed)
Patient ID: Tracy Flores, female   DOB: Apr 20, 1991, 24 y.o.   MRN: [id number] Patient ID: JONET MATHIES, female   DOB: 09-Jun-1991, 24 y.o.   MRN: [id number] Patient ID: SARAIYAH HEMMINGER, female   DOB: 03-15-1991, 24 y.o.   MRN: [id number] Patient ID: SHANECIA HOGANSON, female   DOB: 04/27/1991, 24 y.o.   MRN: [id number] Patient ID: BROOKELLE PELLICANE, female   DOB: 07-24-1990, 24 y.o.   MRN: [id number] Patient ID: OSA FOGARTY, female   DOB: September 27, 1990, 24 y.o.   MRN: [id number] Patient ID: RILYA LONGO, female   DOB: [birth year], 24 y.o.   MRN: [id number] Patient ID: CLARA HERBISON, female   DOB: 1991/02/11, 24 y.o.   MRN: [id number] Patient ID: SHEANA BIR, female   DOB: 12-Mar-1991, 24 y.o.   MRN: [id number]  Psychiatric Assessment Adult  Patient Identification:  Tracy Flores Date of Evaluation:  06/30/2015 Chief Complaint: "I'm depressed." History of Chief Complaint:   Chief Complaint  Patient presents with  . Depression  . Anxiety  . Follow-up    Depression        Associated symptoms include fatigue.  Past medical history includes anxiety.   Anxiety Symptoms include nervous/anxious behavior.     this patient is a 72 year old single white female who lives her boyfriend and  33 year old son in [place]. She works Education officer, environmental houses. She is referred by Dr.-Luking's office for assessment of depression and anxiety  The patient states that her difficulties with depression and panic attacks started at age 32. She felt like she was being bullied at school and had a hard time going to school. Sometimes she was too depressed to go out of bed. She was seeing a Veterinary surgeon and a psychiatrist in [place] and was tried on numerous medications. She's had negative reactions to several such as Zoloft not working Effexor making her worse Lamictal not working and Abilify causing restless legs.  In 2012 she had a particularly difficult year. She was hospitalized 3 times, once at the [facility]  and twice at old [facility]. Prior to her last hospitalization she had made a suicide attempt by trying to crash up her car. She did have a good response to Paxil and Xanax but stopped the medications when she became pregnant last year. Since having her baby she's been depressed again and the Paxil was restarted.  This helped for a while but lately she's been more depressed and not finding much joy in things. She used to be on a higher dose of Paxil. She is sleeping fairly well but her energy is low she is tired all the time. She does spend time with her sister and friends but is very uncomfortable going out in public around crowds or attending community college. She has panic attacks about once a week. She denies being suicidal and claims that this stopped when she had a child and was responsible for another person. She's not using drugs or alcohol. She's never had psychotic symptoms or manic symptoms. However she tends to worry and ruminate about the same things over and over and is constantly cleaning her house.  The patient returns after 4 weeks. She states that the Viibryd isn't really working all that well. She's got depressed again and had to quit going to school. She is unable to sleep at night even with taking the Ativan before sleep. She also feels tired all day long. We reviewed all the medicines she is  tried and overall she has done the best on Paxil. I suggested we taper off the Viibryd and restart Paxil. When she was on it she slept much better. She denies suicidal ideation today but just feels somewhat useless. Review of Systems  Constitutional: Positive for fatigue.  HENT: Negative.   Eyes: Negative.   Respiratory: Negative.   Cardiovascular: Negative.   Gastrointestinal: Negative.   Endocrine: Negative.   Genitourinary: Negative.   Musculoskeletal: Negative.   Allergic/Immunologic: Negative.   Neurological: Negative.   Hematological: Negative.   Psychiatric/Behavioral: Positive for  depression and dysphoric mood. The patient is nervous/anxious.    Physical Exam not done  Depressive Symptoms: depressed mood, anhedonia, psychomotor retardation, fatigue, difficulty concentrating, anxiety, panic attacks,  (Hypo) Manic Symptoms:   Elevated Mood:  No Irritable Mood:  Yes Grandiosity:  No Distractibility:  Yes Labiality of Mood:  No Delusions:  No Hallucinations:  No Impulsivity:  No Sexually Inappropriate Behavior:  No Financial Extravagance:  No Flight of Ideas:  No  Anxiety Symptoms: Excessive Worry:  Yes Panic Symptoms:  Yes Agoraphobia:  No Obsessive Compulsive: Yes  Symptoms: Cleans house compulsively Specific Phobias:  No Social Anxiety:  Yes  Psychotic Symptoms:  Hallucinations: No None Delusions:  No Paranoia:  No   Ideas of Reference:  No  PTSD Symptoms: Ever had a traumatic exposure:  No Had a traumatic exposure in the last month:  No Re-experiencing: No None Hypervigilance:  No Hyperarousal: No None Avoidance: No None  Traumatic Brain Injury: No   Past Psychiatric History: Diagnosis: Major depression, panic disorder, OCD   Hospitalizations: 3 times in 2012   Outpatient Care: Has seen a therapist in her teen years   Substance Abuse Care: none  Self-Mutilation: none  Suicidal Attempts: Once by trying to Crash her car   Violent Behaviors: none   Past Medical History:   Past Medical History  Diagnosis Date  . IBS (irritable bowel syndrome)   . Acid reflux disease   . Major depression (HCC)   . OCD (obsessive compulsive disorder)   . Anxiety   . Interstitial cystitis   . Pregnancy   . 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 Abnormal pap   . History of gonorrhea 2013  . Reflux   . Reactive airway disease   . URI (upper respiratory infection)    History of Loss of Consciousness:  No Seizure History:  No Cardiac History:  No Allergies:   Allergies  Allergen Reactions  . Abilify [Aripiprazole]      Restless legs  . Celexa [Citalopram Hydrobromide]     Agitation   . Clindamycin/Lincomycin   . Penicillins Other (See Comments)    Unknown Childhood reaction  . Zoloft [Sertraline Hcl]     Suicidal thoughts  . Amoxil [Amoxicillin] Rash   Current Medications:  Current Outpatient Prescriptions  Medication Sig Dispense Refill  . amphetamine-dextroamphetamine (ADDERALL) 20 MG tablet Take 1 tablet (20 mg total) by mouth 2 (two) times daily. 60 tablet 0  . fluticasone (FLONASE) 50 MCG/ACT nasal spray Place 2 sprays into both nostrils daily. 16 g 6  . loratadine (CLARITIN) 10 MG tablet Take 1 tablet (10 mg total) by mouth daily. 30 tablet 11  . LORazepam (ATIVAN) 1 MG tablet Take 1 tablet (1 mg total) by mouth 3 (three) times daily. 90 tablet 2  . valACYclovir (VALTREX) 1000 MG tablet TAKE ONE (1) TABLET BY MOUTH EVERY DAY 30 tablet 6  . PARoxetine (PAXIL) 40 MG tablet  Take 1 tablet (40 mg total) by mouth daily. 30 tablet 2   Current Facility-Administered Medications  Medication Dose Route Frequency Provider Last Rate Last Dose  . etonogestrel (IMPLANON) implant 68 mg  68 mg Subcutaneous Once Jacklyn Shell, CNM        Previous Psychotropic Medications:  Medication Dose   Zoloft, Effexor, Lamictal, Abilify, Xanax                        Substance Abuse History in the last 12 months: Substance Age of 1st Use Last Use Amount Specific Type  Nicotine    smokes one half pack per day    Alcohol      Cannabis      Opiates      Cocaine      Methamphetamines      LSD      Ecstasy      Benzodiazepines      Caffeine      Inhalants      Others:                          Medical Consequences of Substance Abuse: n/a  Legal Consequences of Substance Abuse: n/a  Family Consequences of Substance Abuse: n/a  Blackouts:  No DT's:  No Withdrawal Symptoms:  No None  Social History: Current Place of Residence: [place] [address] of Birth: [place] Family Members: Parents, 2 sisters, boyfriend, son Marital Status:  Single Children:   Sons: 1  Daughters:  Relationships: Lives with boyfriend Education:  GED Educational Problems/Performance: Became too anxious and depressed to attend high school Religious Beliefs/Practices: Unknown History of Abuse: none Occupational Experiences; cleaning houses Military History:  None. Legal History: none Hobbies/Interests: Walking with baby, reading  Family History:   Family History  Problem Relation Age of Onset  . Heart disease    . Arthritis    . Cancer    . Diabetes    . Hypertension    . Depression Mother   . Bipolar disorder Sister   . Drug abuse Paternal Aunt     Mental Status Examination/Evaluation: Objective:  Appearance: Casual and Well Groomed appears tired   Eye Contact::  Good  Speech:  normal  Volume:normal  Mood: Depressed   Affect:  Anxious, depressed  Thought Process:  Goal Directed  Orientation:  Full (Time, Place, and Person)  Thought Content: Within normal limits  Suicidal Thoughts:  No  Homicidal Thoughts:  No  Judgement:  Good  Insight:  Good  Psychomotor Activity: normal  Akathisia:  No  Handed:  Right  AIMS (if indicated):    Assets:  Communication Skills Desire for Improvement Social Support    Laboratory/X-Ray Psychological Evaluation(s)   Reviewed in record      Assessment:  Axis I: Generalized Anxiety Disorder, Major Depression, Recurrent severe and Obsessive Compulsive DisorderADD  AXIS I Generalized Anxiety Disorder, Major Depression, Recurrent severe and Obsessive Compulsive Disorder ADD  AXIS II Deferred  AXIS III Past Medical History  Diagnosis Date  . IBS (irritable bowel syndrome)   . Acid reflux disease   . Major depression (HCC)   . OCD (obsessive compulsive disorder)   . Anxiety   . Interstitial cystitis   . Pregnancy   . 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 Abnormal pap   . History of gonorrhea  2013  . Reflux   . Reactive airway disease   .  URI (upper respiratory infection)      AXIS IV other psychosocial or environmental problems  AXIS V 51-60 moderate symptoms   Treatment Plan/Recommendations:  Plan of Care: Medication management   Laboratory:   Psychotherapy: She'll be assigned a therapist here   Medications: She will stop Vybriid and taper off over the next week while starting Paxil 40 mg daily.she will continue Ativan 1 mg 3 times a day and Adderall 20 mg twice a day. If she is not sleeping well the next week or so she will call back  Routine PRN Medications:  No  Consultations:   Safety Concerns:  She denies thoughts of self-harm   Other:  She will return in 4 weeks     ROSS, Gavin Pound, MD 12/30/201611:58 AM

## 2015-07-20 ENCOUNTER — Ambulatory Visit (HOSPITAL_COMMUNITY): Payer: Self-pay | Admitting: Psychology

## 2015-07-28 ENCOUNTER — Ambulatory Visit (HOSPITAL_COMMUNITY): Payer: Self-pay | Admitting: Psychiatry

## 2015-08-21 ENCOUNTER — Telehealth: Payer: Self-pay | Admitting: Family Medicine

## 2015-08-21 NOTE — Telephone Encounter (Signed)
Mom dropped off two letters concerning the pt. One for [place] and one for [place]. The letters are regarding the pt's mental state. Letters are in [employer] in offices. Scott's will be in yellow folder in his office.

## 2015-08-22 NOTE — Telephone Encounter (Signed)
She should encourage her daughter to make an appointment  To see if there is any other ways we can help her

## 2015-08-22 NOTE — Telephone Encounter (Signed)
Left message for patient's mother Marylene Land) to return call.

## 2015-08-22 NOTE — Telephone Encounter (Signed)
Please let the mother know that I am sympathetic to the letter that she wrote regarding her daughter but unfortunately with her daughter being an adult I cannot intervene or discuss her daughter's case. She should encourage her daughter to seek appropriate help here or psychiatry

## 2015-08-29 NOTE — Telephone Encounter (Signed)
Left message to return call(angela)

## 2015-08-31 NOTE — Telephone Encounter (Signed)
[facility]

## 2015-09-06 NOTE — Telephone Encounter (Signed)
Left message to return call 

## 2015-09-06 NOTE — Telephone Encounter (Signed)
Tracy JonesCarolyn states she referred patient for counseling and psychiatry and patient is being followed by then for this

## 2015-09-12 NOTE — Telephone Encounter (Signed)
No one has returned call after numerous attempts and leaving messages multiple times.

## 2015-09-12 NOTE — Telephone Encounter (Signed)
Copy of letter was scanned in chart

## 2015-10-25 ENCOUNTER — Encounter: Payer: Self-pay | Admitting: Advanced Practice Midwife

## 2015-10-25 ENCOUNTER — Ambulatory Visit (INDEPENDENT_AMBULATORY_CARE_PROVIDER_SITE_OTHER): Payer: Medicaid Other | Admitting: Advanced Practice Midwife

## 2015-10-25 VITALS — BP 122/64 | HR 66 | Wt 157.0 lb

## 2015-10-25 DIAGNOSIS — Z3046 Encounter for surveillance of implantable subdermal contraceptive: Secondary | ICD-10-CM

## 2015-10-25 DIAGNOSIS — Z30017 Encounter for initial prescription of implantable subdermal contraceptive: Secondary | ICD-10-CM | POA: Insufficient documentation

## 2015-10-25 DIAGNOSIS — Z3202 Encounter for pregnancy test, result negative: Secondary | ICD-10-CM | POA: Diagnosis not present

## 2015-10-25 LAB — POCT URINE PREGNANCY: Preg Test, Ur: NEGATIVE

## 2015-10-25 NOTE — Patient Instructions (Signed)
Human Papillomavirus Quadrivalent Vaccine suspension for injection What is this medicine? HUMAN PAPILLOMAVIRUS VACCINE (HYOO muhn pap uh LOH muh vahy ruhs vak SEEN) is a vaccine. It is used to prevent infections of four types of the human papillomavirus. In women, the vaccine may lower your risk of getting cervical, vaginal, vulvar, or anal cancer and genital warts. In men, the vaccine may lower your risk of getting genital warts and anal cancer. You cannot get these diseases from the vaccine. This vaccine does not treat these diseases. This medicine may be used for other purposes; ask your health care provider or pharmacist if you have questions. What should I tell my health care provider before I take this medicine? They need to know if you have any of these conditions: -fever or infection -hemophilia -HIV infection or AIDS -immune system problems -low platelet count -an unusual reaction to Human Papillomavirus Vaccine, yeast, other medicines, foods, dyes, or preservatives -pregnant or trying to get pregnant -breast-feeding How should I use this medicine? This vaccine is for injection in a muscle on your upper arm or thigh. It is given by a health care professional. Tracy Flores will be observed for 15 minutes after each dose. Sometimes, fainting happens after the vaccine is given. You may be asked to sit or lie down during the 15 minutes. Three doses are given. The second dose is given 2 months after the first dose. The last dose is given 4 months after the second dose. A copy of a Vaccine Information Statement will be given before each vaccination. Read this sheet carefully each time. The sheet may change frequently. Talk to your pediatrician regarding the use of this medicine in children. While this drug may be prescribed for children as young as 39 years of age for selected conditions, precautions do apply. Overdosage: If you think you have taken too much of this medicine contact a poison control  center or emergency room at once. NOTE: This medicine is only for you. Do not share this medicine with others. What if I miss a dose? All 3 doses of the vaccine should be given within 6 months. Remember to keep appointments for follow-up doses. Your health care provider will tell you when to return for the next vaccine. Ask your health care professional for advice if you are unable to keep an appointment or miss a scheduled dose. What may interact with this medicine? -other vaccines This list may not describe all possible interactions. Give your health care provider a list of all the medicines, herbs, non-prescription drugs, or dietary supplements you use. Also tell them if you smoke, drink alcohol, or use illegal drugs. Some items may interact with your medicine. What should I watch for while using this medicine? This vaccine may not fully protect everyone. Continue to have regular pelvic exams and cervical or anal cancer screenings as directed by your doctor. The Human Papillomavirus is a sexually transmitted disease. It can be passed by any kind of sexual activity that involves genital contact. The vaccine works best when given before you have any contact with the virus. Many people who have the virus do not have any signs or symptoms. Tell your doctor or health care professional if you have any reaction or unusual symptom after getting the vaccine. What side effects may I notice from receiving this medicine? Side effects that you should report to your doctor or health care professional as soon as possible: -allergic reactions like skin rash, itching or hives, swelling of the face, lips,  or tongue -breathing problems -feeling faint or lightheaded, falls Side effects that usually do not require medical attention (report to your doctor or health care professional if they continue or are bothersome): -cough -dizziness -fever -headache -nausea -redness, warmth, swelling, pain, or itching at site  where injected This list may not describe all possible side effects. Call your doctor for medical advice about side effects. You may report side effects to FDA at 1-800-FDA-1088. Where should I keep my medicine? This drug is given in a hospital or clinic and will not be stored at home. NOTE: This sheet is a summary. It may not cover all possible information. If you have questions about this medicine, talk to your doctor, pharmacist, or health care provider.    2016, Elsevier/Gold Standard. (2013-08-09 13:14:33)

## 2015-10-25 NOTE — Progress Notes (Signed)
Tracy Flores 25 y.o. Filed Vitals:   10/25/15 1405  BP: 122/64  Pulse: 66    Past Medical History  Diagnosis Date  . IBS (irritable bowel syndrome)   . Acid reflux disease   . Major depression (HCC)   . OCD (obsessive compulsive disorder)   . Anxiety   . Interstitial cystitis   . Pregnancy   . 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 Abnormal pap   . History of gonorrhea 2013  . Reflux   . Reactive airway disease   . URI (upper respiratory infection)     Family History  Problem Relation Age of Onset  . Depression Mother   . Bipolar disorder Sister   . Drug abuse Paternal Aunt   . Cancer Paternal Grandmother   . Hypertension Paternal Grandmother   . Diabetes Paternal Grandmother   . Arthritis Paternal Grandmother   . Heart disease Paternal Grandmother     Social History   Social History  . Marital Status: Single    Spouse Name: N/A  . Number of Children: N/A  . Years of Education: in college   Occupational History  . student    Social History Main Topics  . Smoking status: Current Every Day Smoker -- 0.50 packs/day for 5 years    Types: Cigarettes  . Smokeless tobacco: Never Used  . Alcohol Use: No     Comment: very little not often  . Drug Use: No     Comment: occasional marijuana; not now  . Sexual Activity: Yes    Birth Control/ Protection: Implant   Other Topics Concern  . Not on file   Social History Narrative    HPI:  Tracy Flores is here for Nexplanon removal and reinsertion.  She was given informed consent for removal of her Nexplanon and reinsertion of a new one.A signed copy is in the chart.  Appropriate time out taken. Nexlanon site (left arm) identified and thea area was prepped in usual sterile fashon. 2 cc of 1% lidocaine was used to anesthetize the area starting with the distal end of the implant. A small stab incision was made right beside the implant on the distal portion.  The Nexplanon rod was easily pushed  out,grasped using my fingertips, and removed without difficulty.  There was less than 3 cc blood loss. There were no complications. Next, the area was cleansed again and the new Nexplanon was inserted without difficulty.  Steri strips and a pressure bandage were applied.  Pt was instructed to remove pressure bandage in a few hours, and keep insertion site covered with a bandaid for 3 days.    Pt has not had a pap smear in at least 3 years--discussed Gardisil.  Will think about vaccine and go ahead and schedule pap  CRESENZO-DISHMAN,Andri Prestia 10/25/2015 2:42 PM

## 2015-11-01 ENCOUNTER — Ambulatory Visit (INDEPENDENT_AMBULATORY_CARE_PROVIDER_SITE_OTHER): Payer: Medicaid Other | Admitting: Advanced Practice Midwife

## 2015-11-01 ENCOUNTER — Other Ambulatory Visit (HOSPITAL_COMMUNITY)
Admission: RE | Admit: 2015-11-01 | Discharge: 2015-11-01 | Disposition: A | Discharge status: Home / Self Care | Payer: Medicaid Other | Source: Ambulatory Visit | Attending: Advanced Practice Midwife | Admitting: Advanced Practice Midwife

## 2015-11-01 ENCOUNTER — Encounter: Payer: Self-pay | Admitting: Advanced Practice Midwife

## 2015-11-01 VITALS — BP 110/60 | HR 74 | Ht 68.0 in | Wt 161.0 lb

## 2015-11-01 DIAGNOSIS — Z01419 Encounter for gynecological examination (general) (routine) without abnormal findings: Secondary | ICD-10-CM

## 2015-11-01 DIAGNOSIS — Z0000 Encounter for general adult medical examination without abnormal findings: Secondary | ICD-10-CM | POA: Diagnosis not present

## 2015-11-01 DIAGNOSIS — Z113 Encounter for screening for infections with a predominantly sexual mode of transmission: Secondary | ICD-10-CM | POA: Insufficient documentation

## 2015-11-01 DIAGNOSIS — Z01411 Encounter for gynecological examination (general) (routine) with abnormal findings: Secondary | ICD-10-CM | POA: Diagnosis not present

## 2015-11-01 NOTE — Progress Notes (Signed)
Tracy Flores 24 y.o.  Filed Vitals:   11/01/15 1441  BP: 110/60  Pulse: 74     Filed Weights   11/01/15 1441  Weight: 161 lb (73.029 kg)    Past Medical History: Past Medical History  Diagnosis Date  . IBS (irritable bowel syndrome)   . Acid reflux disease   . Major depression (HCC)   . OCD (obsessive compulsive disorder)   . Anxiety   . Interstitial cystitis   . Pregnancy   . 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 Abnormal pap   . History of gonorrhea 2013  . Reflux   . Reactive airway disease   . URI (upper respiratory infection)     Past Surgical History: Past Surgical History  Procedure Laterality Date  . No past surgeries      Family History: Family History  Problem Relation Age of Onset  . Depression Mother   . Bipolar disorder Sister   . Drug abuse Paternal Aunt   . Cancer Paternal Grandmother   . Hypertension Paternal Grandmother   . Diabetes Paternal Grandmother   . Arthritis Paternal Grandmother   . Heart disease Paternal Grandmother     Social History: Social History  Substance Use Topics  . Smoking status: Current Every Day Smoker -- 0.50 packs/day for 5 years    Types: Cigarettes  . Smokeless tobacco: Never Used  . Alcohol Use: No     Comment: very little not often    Allergies:  Allergies  Allergen Reactions  . Abilify [Aripiprazole]     Restless legs  . Celexa [Citalopram Hydrobromide]     Agitation   . Clindamycin/Lincomycin   . Penicillins Other (See Comments)    Unknown Childhood reaction  . Zoloft [Sertraline Hcl]     Suicidal thoughts  . Amoxil [Amoxicillin] Rash      Current outpatient prescriptions:  .  loratadine (CLARITIN) 10 MG tablet, Take 1 tablet (10 mg total) by mouth daily., Disp: 30 tablet, Rfl: 11 .  LORazepam (ATIVAN) 1 MG tablet, Take 1 tablet (1 mg total) by mouth 3 (three) times daily., Disp: 90 tablet, Rfl: 2 .  PARoxetine (PAXIL) 40 MG tablet, Take 1 tablet (40 mg total) by mouth  daily., Disp: 30 tablet, Rfl: 2 .  valACYclovir (VALTREX) 1000 MG tablet, TAKE ONE (1) TABLET BY MOUTH EVERY DAY, Disp: 30 tablet, Rfl: 6  Current facility-administered medications:  .  etonogestrel (IMPLANON) implant 68 mg, 68 mg, Subcutaneous, Once, Jacklyn Shell, CNM  History of Present Illness: Here for pap/physical.  Last pap 3 years ago, normal.  Discussed gardisil at last visit.  She wants to get it. Had nexplanon changed out last week.  No problems    Review of Systems   Patient denies any headaches, blurred vision, shortness of breath, chest pain, abdominal pain, problems with bowel movements, urination, or intercourse.   Physical Exam: General:  Well developed, well nourished, no acute distress Skin:  Warm and dry Neck:  Midline trachea, normal thyroid Lungs; Clear to auscultation bilaterally Breast:  No dominant palpable mass, retraction, or nipple discharge Cardiovascular: Regular rate and rhythm Abdomen:  Soft, non tender, no hepatosplenomegaly Pelvic:  External genitalia is normal in appearance.  The vagina is normal in appearance.  The cervix is bulbous.  Uterus is felt to be normal size, shape, and contour.  No adnexal masses or tenderness noted.  Extremities:  No swelling or varicosities noted.  Nexplanon site healing  Psych:  No  mood changes.     Impression: normal gyn exam     Plan: if normal, pap q 3 years rx for gardisil 9 given --to pick up and bring to us for injection

## 2015-11-03 LAB — CYTOLOGY - PAP

## 2015-11-08 ENCOUNTER — Encounter: Payer: Self-pay | Admitting: Advanced Practice Midwife

## 2015-11-10 ENCOUNTER — Encounter (HOSPITAL_COMMUNITY): Payer: Self-pay | Admitting: Psychiatry

## 2015-11-10 ENCOUNTER — Ambulatory Visit (INDEPENDENT_AMBULATORY_CARE_PROVIDER_SITE_OTHER): Payer: Medicaid Other | Admitting: Psychiatry

## 2015-11-10 VITALS — BP 122/70 | HR 89 | Ht 68.0 in | Wt 161.4 lb

## 2015-11-10 DIAGNOSIS — F32A Depression, unspecified: Secondary | ICD-10-CM

## 2015-11-10 DIAGNOSIS — F329 Major depressive disorder, single episode, unspecified: Secondary | ICD-10-CM

## 2015-11-10 MED ORDER — PAROXETINE HCL 40 MG PO TABS: 40.0000 mg | ORAL_TABLET | Freq: Every day | ORAL | Status: DC

## 2015-11-10 MED ORDER — LORAZEPAM 1 MG PO TABS: 1.0000 mg | ORAL_TABLET | Freq: Three times a day (TID) | ORAL | Status: DC

## 2015-11-10 NOTE — Progress Notes (Signed)
Patient ID: Tracy Flores, female   DOB: 04/13/1991, 25 y.o.   MRN: [id number] Patient ID: Tracy Flores, female   DOB: 10/22/1990, 25 y.o.   MRN: [id number] Patient ID: Tracy Flores, female   DOB: 03/24/1991, 25 y.o.   MRN: [id number] Patient ID: Tracy Flores, female   DOB: 11/04/1990, 25 y.o.   MRN: [id number] Patient ID: Tracy Flores, female   DOB: 07/20/1990, 25 y.o.   MRN: [id number] Patient ID: Tracy Flores, female   DOB: 04/17/1991, 25 y.o.   MRN: [id number] Patient ID: Tracy Flores, female   DOB: 05/23/1991, 25 y.o.   MRN: [id number] Patient ID: Tracy Flores, female   DOB: 05/21/1991, 25 y.o.   MRN: [id number] Patient ID: Tracy Flores, female   DOB: 02/02/1991, 25 y.o.   MRN: [id number] Patient ID: Tracy FootmanMallory D Cranor, female   DOB: 07/22/1990, 25 y.o.   MRN: [id number]  Psychiatric Assessment Adult  Patient Identification:  Tracy FootmanMallory D Leinbach Date of Evaluation:  11/10/2015 Chief Complaint: "I'm depressed." History of Chief Complaint:   Chief Complaint  Patient presents with  . Depression  . Anxiety  . Agitation  . Follow-up    Depression        Associated symptoms include fatigue.  Past medical history includes anxiety.   Anxiety Symptoms include nervous/anxious behavior.     this patient is a 25 year old single white female who lives her boyfriend and  26 year old son in [place]. She works Education officer, environmentalcleaning houses. She is referred by Dr.-Luking's office for assessment of depression and anxiety  The patient states that her difficulties with depression and panic attacks started at age 25. She felt like she was being bullied at school and had a hard time going to school. Sometimes she was too depressed to go out of bed. She was seeing a Veterinary surgeoncounselor and a psychiatrist in [place] and was tried on numerous medications. She's had negative reactions to several such as Zoloft not working Effexor making her worse Lamictal not working and Abilify causing restless legs.  In 2012 she had a particularly  difficult year. She was hospitalized 3 times, once at the [facility] and twice at old [facility]. Prior to her last hospitalization she had made a suicide attempt by trying to crash up her car. She did have a good response to Paxil and Xanax but stopped the medications when she became pregnant last year. Since having her baby she's been depressed again and the Paxil was restarted.  This helped for a while but lately she's been more depressed and not finding much joy in things. She used to be on a higher dose of Paxil. She is sleeping fairly well but her energy is low she is tired all the time. She does spend time with her sister and friends but is very uncomfortable going out in public around crowds or attending community college. She has panic attacks about once a week. She denies being suicidal and claims that this stopped when she had a child and was responsible for another person. She's not using drugs or alcohol. She's never had psychotic symptoms or manic symptoms. However she tends to worry and ruminate about the same things over and over and is constantly cleaning her house.  The patient returns after a long absence. Her mother actually wrote a letter to me and her primary physician in January stating how depressed the patient was and how much help she needed. As I didn't have  a release to speak to the mother I did call her back and explained what I received the letter but that the patient would need to call and make an appointment. 5 months later she is finally come back. She ran out of her medication about 2 weeks ago. She has been seeing Sarah snide Hyacinth Meeker for therapy and this is been somewhat helpful as she is learning techniques to manage anxiety. She still wakes up depressed and irritable most days. When she was on Paxil it helped a little bit but she never knows what her mood is going to be like from day to day. Her symptoms sound more and more like bipolar depression. The  Ativan was helping with her anxiety and was also helping her sleep and she would like to go back on it. 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 I suggested we add Latuda to help with her significant mood swings and bipolar depression Review of Systems  Constitutional: Positive for fatigue.  HENT: Negative.   Eyes: Negative.   Respiratory: Negative.   Cardiovascular: Negative.   Gastrointestinal: Negative.   Endocrine: Negative.   Genitourinary: Negative.   Musculoskeletal: Negative.   Allergic/Immunologic: Negative.   Neurological: Negative.   Hematological: Negative.   Psychiatric/Behavioral: Positive for depression and dysphoric mood. The patient is nervous/anxious.    Physical Exam not done  Depressive Symptoms: depressed mood, anhedonia, psychomotor retardation, fatigue, difficulty concentrating, anxiety, panic attacks,  (Hypo) Manic Symptoms:   Elevated Mood:  No Irritable Mood:  Yes Grandiosity:  No Distractibility:  Yes Labiality of Mood:  No Delusions:  No Hallucinations:  No Impulsivity:  No Sexually Inappropriate Behavior:  No Financial Extravagance:  No Flight of Ideas:  No  Anxiety Symptoms: Excessive Worry:  Yes Panic Symptoms:  Yes Agoraphobia:  No Obsessive Compulsive: Yes  Symptoms: Cleans house compulsively Specific Phobias:  No Social Anxiety:  Yes  Psychotic Symptoms:  Hallucinations: No None Delusions:  No Paranoia:  No   Ideas of Reference:  No  PTSD Symptoms: Ever had a traumatic exposure:  No Had a traumatic exposure in the last month:  No Re-experiencing: No None Hypervigilance:  No Hyperarousal: No None Avoidance: No None  Traumatic Brain Injury: No   Past Psychiatric History: Diagnosis: Major depression, panic disorder, OCD   Hospitalizations: 3 times in 2012   Outpatient Care: Has seen a therapist in her teen years   Substance Abuse Care: none   Self-Mutilation: none  Suicidal Attempts: Once by trying to Crash her car   Violent Behaviors: none   Past Medical History:   Past Medical History  Diagnosis Date  . IBS (irritable bowel syndrome)   . Acid reflux disease   . Major depression (HCC)   . OCD (obsessive compulsive disorder)   . Anxiety   . Interstitial cystitis   . Pregnancy   . 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 Abnormal pap   . History of gonorrhea 2013  . Reflux   . Reactive airway disease   . URI (upper respiratory infection)    History of Loss of Consciousness:  No Seizure History:  No Cardiac History:  No Allergies:   Allergies  Allergen Reactions  . Abilify [Aripiprazole]     Restless legs  . Celexa [Citalopram Hydrobromide]     Agitation   . Clindamycin/Lincomycin   . Penicillins Other (See Comments)    Unknown Childhood reaction  .  Zoloft [Sertraline Hcl]     Suicidal thoughts  . Amoxil [Amoxicillin] Rash   Current Medications:  Current Outpatient Prescriptions  Medication Sig Dispense Refill  . loratadine (CLARITIN) 10 MG tablet Take 1 tablet (10 mg total) by mouth daily. 30 tablet 11  . LORazepam (ATIVAN) 1 MG tablet Take 1 tablet (1 mg total) by mouth 3 (three) times daily. 90 tablet 2  . PARoxetine (PAXIL) 40 MG tablet Take 1 tablet (40 mg total) by mouth daily. 30 tablet 2  . valACYclovir (VALTREX) 1000 MG tablet TAKE ONE (1) TABLET BY MOUTH EVERY DAY 30 tablet 6   Current Facility-Administered Medications  Medication Dose Route Frequency Provider Last Rate Last Dose  . etonogestrel (IMPLANON) implant 68 mg  68 mg Subcutaneous Once Jacklyn Shell, CNM        Previous Psychotropic Medications:  Medication Dose   Zoloft, Effexor, Lamictal, Abilify, Xanax                        Substance Abuse History in the last 12 months: Substance Age of 1st Use Last Use Amount Specific Type  Nicotine    smokes one half pack per day    Alcohol      Cannabis       Opiates      Cocaine      Methamphetamines      LSD      Ecstasy      Benzodiazepines      Caffeine      Inhalants      Others:                          Medical Consequences of Substance Abuse: n/a  Legal Consequences of Substance Abuse: n/a  Family Consequences of Substance Abuse: n/a  Blackouts:  No DT's:  No Withdrawal Symptoms:  No None  Social History: Current Place of Residence: [place] [address] of Birth: [place] Family Members: Parents, 2 sisters, boyfriend, son Marital Status:  Single Children:   Sons: 1  Daughters:  Relationships: Lives with boyfriend Education:  GED Educational Problems/Performance: Became too anxious and depressed to attend high school Religious Beliefs/Practices: Unknown History of Abuse: none Occupational Experiences; cleaning houses Military History:  None. Legal History: none Hobbies/Interests: Walking with baby, reading  Family History:   Family History  Problem Relation Age of Onset  . Depression Mother   . Bipolar disorder Sister   . Drug abuse Paternal Aunt   . Cancer Paternal Grandmother   . Hypertension Paternal Grandmother   . Diabetes Paternal Grandmother   . Arthritis Paternal Grandmother   . Heart disease Paternal Grandmother     Mental Status Examination/Evaluation: Objective:  Appearance: Casual and Well Groomed  Eye Contact::  Good  Speech:  normal  Volume:normal  Mood: Depressed   Affect:  Anxious, depressed  Thought Process:  Goal Directed  Orientation:  Full (Time, Place, and Person)  Thought Content: Within normal limits  Suicidal Thoughts:  No  Homicidal Thoughts:  No  Judgement:  Good  Insight:  Good  Psychomotor Activity: normal  Akathisia:  No  Handed:  Right  AIMS (if indicated):    Assets:  Communication Skills Desire for Improvement Social Support    Laboratory/X-Ray Psychological Evaluation(s)   Reviewed in record      Assessment:  Axis I:  Generalized Anxiety Disorder, Major Depression, Recurrent severe and Obsessive Compulsive DisorderADD  AXIS  I Generalized Anxiety Disorder, Major Depression, Recurrent severe and Obsessive Compulsive Disorder ADD  AXIS II Deferred  AXIS III Past Medical History  Diagnosis Date  . IBS (irritable bowel syndrome)   . Acid reflux disease   . Major depression (HCC)   . OCD (obsessive compulsive disorder)   . Anxiety   . Interstitial cystitis   . Pregnancy   . 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 Abnormal pap   . History of gonorrhea 2013  . Reflux   . Reactive airway disease   . URI (upper respiratory infection)      AXIS IV other psychosocial or environmental problems  AXIS V 51-60 moderate symptoms   Treatment Plan/Recommendations:  Plan of Care: Medication management   Laboratory:   Psychotherapy: SheIs seeing a new therapist weekly   Medications: She willRestart Paxil 40 mg daily for depression and Ativan 1 mg 3 times a day for anxiety. She has been given Latuda samples to start at 20 mg with dinner for 2 weeks and then advance to 40 mg daily   Routine PRN Medications:  No  Consultations:   Safety Concerns:  She denies thoughts of self-harm   Other:  She will return in 4 weeks.She was strongly encouraged to keep her appointments here otherwise we will not be able to help her     Diannia Ruder, MD 5/12/201711:31 AM

## 2015-12-04 ENCOUNTER — Ambulatory Visit (INDEPENDENT_AMBULATORY_CARE_PROVIDER_SITE_OTHER): Payer: Medicaid Other | Admitting: Psychiatry

## 2015-12-04 ENCOUNTER — Encounter (HOSPITAL_COMMUNITY): Payer: Self-pay | Admitting: Psychiatry

## 2015-12-04 VITALS — BP 122/68 | HR 63 | Ht 68.0 in | Wt 167.0 lb

## 2015-12-04 DIAGNOSIS — F32A Depression, unspecified: Secondary | ICD-10-CM

## 2015-12-04 DIAGNOSIS — F411 Generalized anxiety disorder: Secondary | ICD-10-CM

## 2015-12-04 DIAGNOSIS — F329 Major depressive disorder, single episode, unspecified: Secondary | ICD-10-CM | POA: Diagnosis not present

## 2015-12-04 DIAGNOSIS — F909 Attention-deficit hyperactivity disorder, unspecified type: Secondary | ICD-10-CM | POA: Diagnosis not present

## 2015-12-04 DIAGNOSIS — F429 Obsessive-compulsive disorder, unspecified: Secondary | ICD-10-CM | POA: Diagnosis not present

## 2015-12-04 MED ORDER — LURASIDONE HCL 40 MG PO TABS: 40.0000 mg | ORAL_TABLET | Freq: Every day | ORAL | Status: DC

## 2015-12-04 NOTE — Progress Notes (Signed)
Patient ID: Tracy Flores, female   DOB: [birth year], 25 y.o.   MRN: [id number] Patient ID: Tracy Flores, female   DOB: 1990-12-25, 25 y.o.   MRN: [id number] Patient ID: Tracy Flores, female   DOB: 06/22/1991, 25 y.o.   MRN: [id number] Patient ID: Tracy Flores, female   DOB: 1991/02/12, 25 y.o.   MRN: [id number] Patient ID: Tracy Flores, female   DOB: 01-02-1991, 25 y.o.   MRN: [id number] Patient ID: Tracy Flores, female   DOB: [birth year], 25 y.o.   MRN: [id number] Patient ID: Tracy Flores, female   DOB: 28-Jun-1991, 25 y.o.   MRN: [id number] Patient ID: Tracy Flores, female   DOB: [birth year], 25 y.o.   MRN: [id number] Patient ID: Tracy Flores, female   DOB: 02/01/1991, 25 y.o.   MRN: [id number] Patient ID: Tracy Flores, female   DOB: [birth year], 25 y.o.   MRN: [id number] Patient ID: Tracy Flores, female   DOB: 04/30/1991, 25 y.o.   MRN: [id number]  Psychiatric Assessment Adult  Patient Identification:  Tracy Flores Date of Evaluation:  12/04/2015 Chief Complaint: "I'm depressed." History of Chief Complaint:   Chief Complaint  Patient presents with  . Depression  . Anxiety  . Follow-up    Depression        Associated symptoms include fatigue.  Past medical history includes anxiety.   Anxiety Symptoms include nervous/anxious behavior.     this patient is a 25 year old single white female who lives her boyfriend and  36 year old son in [place]. She works Education officer, environmental houses. She is referred by Dr.-Luking's office for assessment of depression and anxiety  The patient states that her difficulties with depression and panic attacks started at age 31. She felt like she was being bullied at school and had a hard time going to school. Sometimes she was too depressed to go out of bed. She was seeing a Veterinary surgeon and a psychiatrist in [place] and was tried on numerous medications. She's had negative reactions to several such as Zoloft not working Effexor making her worse Lamictal not working and  Abilify causing restless legs.  In 2012 she had a particularly difficult year. She was hospitalized 3 times, once at the [facility] and twice at old [facility]. Prior to her last hospitalization she had made a suicide attempt by trying to crash up her car. She did have a good response to Paxil and Xanax but stopped the medications when she became pregnant last year. Since having her baby she's been depressed again and the Paxil was restarted.  This helped for a while but lately she's been more depressed and not finding much joy in things. She used to be on a higher dose of Paxil. She is sleeping fairly well but her energy is low she is tired all the time. She does spend time with her sister and friends but is very uncomfortable going out in public around crowds or attending community college. She has panic attacks about once a week. She denies being suicidal and claims that this stopped when she had a child and was responsible for another person. She's not using drugs or alcohol. She's never had psychotic symptoms or manic symptoms. However she tends to worry and ruminate about the same things over and over and is constantly cleaning her house.  The patient returns after 4 weeks. Last time we added Latuda to her regimen and it has made a big difference. She has more  energy, is waking up early in the morning and doing things with her family. 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 Her mood is definitely improved and she is seeing a big difference as does her family. She denies being anxious or panicky and she is sleeping well. Her legs are "a little twitchy" on the 40 mg dose but she would like to continue it for now Review of Systems  Constitutional: Positive for fatigue.  HENT: Negative.   Eyes: Negative.   Respiratory: Negative.   Cardiovascular: Negative.   Gastrointestinal: Negative.   Endocrine: Negative.   Genitourinary: Negative.   Musculoskeletal: Negative.    Allergic/Immunologic: Negative.   Neurological: Negative.   Hematological: Negative.   Psychiatric/Behavioral: Positive for depression and dysphoric mood. The patient is nervous/anxious.    Physical Exam not done  Depressive Symptoms: depressed mood, anhedonia, psychomotor retardation, fatigue, difficulty concentrating, anxiety, panic attacks,  (Hypo) Manic Symptoms:   Elevated Mood:  No Irritable Mood:  Yes Grandiosity:  No Distractibility:  Yes Labiality of Mood:  No Delusions:  No Hallucinations:  No Impulsivity:  No Sexually Inappropriate Behavior:  No Financial Extravagance:  No Flight of Ideas:  No  Anxiety Symptoms: Excessive Worry:  Yes Panic Symptoms:  Yes Agoraphobia:  No Obsessive Compulsive: Yes  Symptoms: Cleans house compulsively Specific Phobias:  No Social Anxiety:  Yes  Psychotic Symptoms:  Hallucinations: No None Delusions:  No Paranoia:  No   Ideas of Reference:  No  PTSD Symptoms: Ever had a traumatic exposure:  No Had a traumatic exposure in the last month:  No Re-experiencing: No None Hypervigilance:  No Hyperarousal: No None Avoidance: No None  Traumatic Brain Injury: No   Past Psychiatric History: Diagnosis: Major depression, panic disorder, OCD   Hospitalizations: 3 times in 2012   Outpatient Care: Has seen a therapist in her teen years   Substance Abuse Care: none  Self-Mutilation: none  Suicidal Attempts: Once by trying to Crash her car   Violent Behaviors: none   Past Medical History:   Past Medical History  Diagnosis Date  . IBS (irritable bowel syndrome)   . Acid reflux disease   . Major depression (HCC)   . OCD (obsessive compulsive disorder)   . Anxiety   . Interstitial cystitis   . Pregnancy   . 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 Abnormal pap   . History of gonorrhea 2013  . Reflux   . Reactive airway disease   . URI (upper respiratory infection)    History of Loss of Consciousness:   No Seizure History:  No Cardiac History:  No Allergies:   Allergies  Allergen Reactions  . Abilify [Aripiprazole]     Restless legs  . Celexa [Citalopram Hydrobromide]     Agitation   . Clindamycin/Lincomycin   . Penicillins Other (See Comments)    Unknown Childhood reaction  . Zoloft [Sertraline Hcl]     Suicidal thoughts  . Amoxil [Amoxicillin] Rash   Current Medications:  Current Outpatient Prescriptions  Medication Sig Dispense Refill  . loratadine (CLARITIN) 10 MG tablet Take 1 tablet (10 mg total) by mouth daily. 30 tablet 11  . LORazepam (ATIVAN) 1 MG tablet Take 1 tablet (1 mg total) by mouth 3 (three) times daily. 90 tablet 2  . lurasidone (LATUDA) 40 MG TABS tablet Take 1 tablet (40 mg total) by mouth daily with supper. 30 tablet 2  . PARoxetine (PAXIL) 40 MG tablet Take 1 tablet (40 mg total) by  mouth daily. 30 tablet 2  . valACYclovir (VALTREX) 1000 MG tablet TAKE ONE (1) TABLET BY MOUTH EVERY DAY 30 tablet 6   Current Facility-Administered Medications  Medication Dose Route Frequency Provider Last Rate Last Dose  . etonogestrel (IMPLANON) implant 68 mg  68 mg Subcutaneous Once Jacklyn Shell, CNM        Previous Psychotropic Medications:  Medication Dose   Zoloft, Effexor, Lamictal, Abilify, Xanax                        Substance Abuse History in the last 12 months: Substance Age of 1st Use Last Use Amount Specific Type  Nicotine    smokes one half pack per day    Alcohol      Cannabis      Opiates      Cocaine      Methamphetamines      LSD      Ecstasy      Benzodiazepines      Caffeine      Inhalants      Others:                          Medical Consequences of Substance Abuse: n/a  Legal Consequences of Substance Abuse: n/a  Family Consequences of Substance Abuse: n/a  Blackouts:  No DT's:  No Withdrawal Symptoms:  No None  Social History: Current Place of Residence: [place] [address] of Birth:  [place] Family Members: Parents, 2 sisters, boyfriend, son Marital Status:  Single Children:   Sons: 1  Daughters:  Relationships: Lives with boyfriend Education:  GED Educational Problems/Performance: Became too anxious and depressed to attend high school Religious Beliefs/Practices: Unknown History of Abuse: none Occupational Experiences; cleaning houses Military History:  None. Legal History: none Hobbies/Interests: Walking with baby, reading  Family History:   Family History  Problem Relation Age of Onset  . Depression Mother   . Bipolar disorder Sister   . Drug abuse Paternal Aunt   . Cancer Paternal Grandmother   . Hypertension Paternal Grandmother   . Diabetes Paternal Grandmother   . Arthritis Paternal Grandmother   . Heart disease Paternal Grandmother     Mental Status Examination/Evaluation: Objective:  Appearance: Casual and Well Groomed  Eye Contact::  Good  Speech:  normal  Volume:normal  Mood:Fairly good   Affect: Much brighter   Thought Process:  Goal Directed  Orientation:  Full (Time, Place, and Person)  Thought Content: Within normal limits  Suicidal Thoughts:  No  Homicidal Thoughts:  No  Judgement:  Good  Insight:  Good  Psychomotor Activity: normal  Akathisia:  No  Handed:  Right  AIMS (if indicated):    Assets:  Communication Skills Desire for Improvement Social Support    Laboratory/X-Ray Psychological Evaluation(s)   Reviewed in record      Assessment:  Axis I: Generalized Anxiety Disorder, Major Depression, Recurrent severe and Obsessive Compulsive DisorderADD  AXIS I Generalized Anxiety Disorder, Major Depression, Recurrent severe and Obsessive Compulsive Disorder ADD  AXIS II Deferred  AXIS III Past Medical History  Diagnosis Date  . IBS (irritable bowel syndrome)   . Acid reflux disease   . Major depression (HCC)   . OCD (obsessive compulsive disorder)   . Anxiety   . Interstitial cystitis   . Pregnancy    . 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 Abnormal pap   . History  of gonorrhea 2013  . Reflux   . Reactive airway disease   . URI (upper respiratory infection)      AXIS IV other psychosocial or environmental problems  AXIS V 51-60 moderate symptoms   Treatment Plan/Recommendations:  Plan of Care: Medication management   Laboratory:   Psychotherapy: SheIs seeing a new therapist weekly   Medications: She will continue Paxil 40 mg daily for depression and Ativan 1 mg 3 times a day for anxiety. She will continue Latuda 40 mg with supper   Routine PRN Medications:  No  Consultations:   Safety Concerns:  She denies thoughts of self-harm   Other:  She will return in 6 weeks     Diannia Ruder, MD 6/5/20173:45 PM

## 2015-12-06 ENCOUNTER — Telehealth (HOSPITAL_COMMUNITY): Payer: Self-pay | Admitting: *Deleted

## 2015-12-06 NOTE — Telephone Encounter (Signed)
Prior authorization for Latuda received. Called  tracks spoke with Nermin who gave approval [id number] until 12/05/2016. Called to notify pharmacy.

## 2015-12-06 NOTE — Telephone Encounter (Signed)
noted 

## 2016-01-03 ENCOUNTER — Ambulatory Visit: Payer: Medicaid Other

## 2016-01-15 ENCOUNTER — Ambulatory Visit (INDEPENDENT_AMBULATORY_CARE_PROVIDER_SITE_OTHER): Payer: Medicaid Other | Admitting: Psychiatry

## 2016-01-15 ENCOUNTER — Encounter (HOSPITAL_COMMUNITY): Payer: Self-pay | Admitting: Psychiatry

## 2016-01-15 VITALS — BP 122/64 | HR 74 | Ht 68.0 in | Wt 172.2 lb

## 2016-01-15 DIAGNOSIS — F329 Major depressive disorder, single episode, unspecified: Secondary | ICD-10-CM

## 2016-01-15 DIAGNOSIS — F32A Depression, unspecified: Secondary | ICD-10-CM

## 2016-01-15 MED ORDER — LORAZEPAM 1 MG PO TABS: 1.0000 mg | ORAL_TABLET | Freq: Three times a day (TID) | ORAL | Status: AC

## 2016-01-15 MED ORDER — AMPHETAMINE-DEXTROAMPHETAMINE 20 MG PO TABS: 20.0000 mg | ORAL_TABLET | Freq: Every day | ORAL | Status: DC

## 2016-01-15 MED ORDER — PAROXETINE HCL 40 MG PO TABS: 40.0000 mg | ORAL_TABLET | Freq: Every day | ORAL | Status: DC

## 2016-01-15 MED ORDER — LURASIDONE HCL 40 MG PO TABS: 40.0000 mg | ORAL_TABLET | Freq: Every day | ORAL | Status: DC

## 2016-01-15 NOTE — Progress Notes (Signed)
Patient ID: Tracy Flores, female   DOB: 06/07/1991, 25 y.o.   MRN: [id number] Patient ID: Tracy Flores, female   DOB: 04/12/1991, 25 y.o.   MRN: [id number] Patient ID: Tracy Flores, female   DOB: 12/30/1990, 25 y.o.   MRN: [id number] Patient ID: Tracy Flores, female   DOB: 02/17/1991, 25 y.o.   MRN: [id number] Patient ID: Tracy Flores, female   DOB: 11/17/1990, 25 y.o.   MRN: [id number] Patient ID: Tracy Flores, female   DOB: 03/29/1991, 25 y.o.   MRN: [id number] Patient ID: Tracy Flores, female   DOB: 03/17/1991, 25 y.o.   MRN: [id number] Patient ID: Tracy Flores, female   DOB: 06/20/1991, [age] y.o.   MRN: [id number] Patient ID: Tracy Flores, female   DOB: 09/05/1990, 25 y.o.   MRN: [id number] Patient ID: Tracy FootmanMallory D Olvey, female   DOB: 02/27/1991, 25 y.o.   MRN: [id number] Patient ID: Tracy FootmanMallory D Kamen, female   DOB: 02/05/1991, 25 y.o.   MRN: [id number] Patient ID: Tracy FootmanMallory D Santerre, female   DOB: 01/21/1991, 25 y.o.   MRN: [id number]  Psychiatric Assessment Adult  Patient Identification:  Tracy FootmanMallory D Straus Date of Evaluation:  01/15/2016 Chief Complaint: "I'm depressed." History of Chief Complaint:   Chief Complaint  Patient presents with  . Depression  . ADD  . Anxiety  . Follow-up    Depression        Associated symptoms include fatigue.  Past medical history includes anxiety.   Anxiety Symptoms include nervous/anxious behavior.     this patient is a 89 year old single white female who lives her boyfriend and  26 year old son in [place]. She works Education officer, environmentalcleaning houses. She is referred by Dr.-Luking's office for assessment of depression and anxiety  The patient states that her difficulties with depression and panic attacks started at age 25. She felt like she was being bullied at school and had a hard time going to school. Sometimes she was too depressed to go out of bed. She was seeing a Veterinary surgeoncounselor and a psychiatrist in [place] and was tried on numerous medications. She's had negative reactions  to several such as Zoloft not working Effexor making her worse Lamictal not working and Abilify causing restless legs.  In 2012 she had a particularly difficult year. She was hospitalized 3 times, once at the [facility] and twice at old [facility]. Prior to her last hospitalization she had made a suicide attempt by trying to crash up her car. She did have a good response to Paxil and Xanax but stopped the medications when she became pregnant last year. Since having her baby she's been depressed again and the Paxil was restarted.  This helped for a while but lately she's been more depressed and not finding much joy in things. She used to be on a higher dose of Paxil. She is sleeping fairly well but her energy is low she is tired all the time. She does spend time with her sister and friends but is very uncomfortable going out in public around crowds or attending community college. She has panic attacks about once a week. She denies being suicidal and claims that this stopped when she had a child and was responsible for another person. She's not using drugs or alcohol. She's never had psychotic symptoms or manic symptoms. However she tends to worry and ruminate about the same things over and over and is constantly cleaning her house.  The patient returns after 4  weeks. She tells me she has a new job at [employer] and she is very excited about it. She obviously has a lot more energy now. She still helping her grandmother cleaning houses as well. The Kasandra Knudsen has really helped her mood and she no longer has restless legs. She states that she's having trouble focusing and would like to go back on Adderall which she took when she was at the [employer]. I told her this would be fine as long as she didn't take it too late in the evening Review of Systems  Constitutional: Positive for fatigue.  HENT: Negative.   Eyes: Negative.   Respiratory: Negative.   Cardiovascular: Negative.    Gastrointestinal: Negative.   Endocrine: Negative.   Genitourinary: Negative.   Musculoskeletal: Negative.   Allergic/Immunologic: Negative.   Neurological: Negative.   Hematological: Negative.   Psychiatric/Behavioral: Positive for depression and dysphoric mood. The patient is nervous/anxious.    Physical Exam not done  Depressive Symptoms: depressed mood, anhedonia, psychomotor retardation, fatigue, difficulty concentrating, anxiety, panic attacks,  (Hypo) Manic Symptoms:   Elevated Mood:  No Irritable Mood:  Yes Grandiosity:  No Distractibility:  Yes Labiality of Mood:  No Delusions:  No Hallucinations:  No Impulsivity:  No Sexually Inappropriate Behavior:  No Financial Extravagance:  No Flight of Ideas:  No  Anxiety Symptoms: Excessive Worry:  Yes Panic Symptoms:  Yes Agoraphobia:  No Obsessive Compulsive: Yes  Symptoms: Cleans house compulsively Specific Phobias:  No Social Anxiety:  Yes  Psychotic Symptoms:  Hallucinations: No None Delusions:  No Paranoia:  No   Ideas of Reference:  No  PTSD Symptoms: Ever had a traumatic exposure:  No Had a traumatic exposure in the last month:  No Re-experiencing: No None Hypervigilance:  No Hyperarousal: No None Avoidance: No None  Traumatic Brain Injury: No   Past Psychiatric History: Diagnosis: Major depression, panic disorder, OCD   Hospitalizations: 3 times in 2012   Outpatient Care: Has seen a therapist in her teen years   Substance Abuse Care: none  Self-Mutilation: none  Suicidal Attempts: Once by trying to Crash her car   Violent Behaviors: none   Past Medical History:   Past Medical History  Diagnosis Date  . IBS (irritable bowel syndrome)   . Acid reflux disease   . Major depression (HCC)   . OCD (obsessive compulsive disorder)   . Anxiety   . Interstitial cystitis   . Pregnancy   . 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 Abnormal pap   . History of gonorrhea 2013  .  Reflux   . Reactive airway disease   . URI (upper respiratory infection)    History of Loss of Consciousness:  No Seizure History:  No Cardiac History:  No Allergies:   Allergies  Allergen Reactions  . Abilify [Aripiprazole]     Restless legs  . Celexa [Citalopram Hydrobromide]     Agitation   . Clindamycin/Lincomycin   . Penicillins Other (See Comments)    Unknown Childhood reaction  . Zoloft [Sertraline Hcl]     Suicidal thoughts  . Amoxil [Amoxicillin] Rash   Current Medications:  Current Outpatient Prescriptions  Medication Sig Dispense Refill  . loratadine (CLARITIN) 10 MG tablet Take 1 tablet (10 mg total) by mouth daily. 30 tablet 11  . LORazepam (ATIVAN) 1 MG tablet Take 1 tablet (1 mg total) by mouth 3 (three) times daily. 90 tablet 2  . lurasidone (LATUDA) 40 MG TABS tablet Take  1 tablet (40 mg total) by mouth daily with supper. 30 tablet 2  . PARoxetine (PAXIL) 40 MG tablet Take 1 tablet (40 mg total) by mouth daily. 30 tablet 2  . valACYclovir (VALTREX) 1000 MG tablet TAKE ONE (1) TABLET BY MOUTH EVERY DAY 30 tablet 6  . amphetamine-dextroamphetamine (ADDERALL) 20 MG tablet Take 1 tablet (20 mg total) by mouth daily. 30 tablet 0  . amphetamine-dextroamphetamine (ADDERALL) 20 MG tablet Take 1 tablet (20 mg total) by mouth daily. 30 tablet 0   Current Facility-Administered Medications  Medication Dose Route Frequency Provider Last Rate Last Dose  . etonogestrel (IMPLANON) implant 68 mg  68 mg Subcutaneous Once Jacklyn Shell, CNM        Previous Psychotropic Medications:  Medication Dose   Zoloft, Effexor, Lamictal, Abilify, Xanax                        Substance Abuse History in the last 12 months: Substance Age of 1st Use Last Use Amount Specific Type  Nicotine    smokes one half pack per day    Alcohol      Cannabis      Opiates      Cocaine      Methamphetamines      LSD      Ecstasy      Benzodiazepines      Caffeine      Inhalants       Others:                          Medical Consequences of Substance Abuse: n/a  Legal Consequences of Substance Abuse: n/a  Family Consequences of Substance Abuse: n/a  Blackouts:  No DT's:  No Withdrawal Symptoms:  No None  Social History: Current Place of Residence: [place] [address] of Birth: [place] Family Members: Parents, 2 sisters, boyfriend, son Marital Status:  Single Children:   Sons: 1  Daughters:  Relationships: Lives with boyfriend Education:  GED Educational Problems/Performance: Became too anxious and depressed to attend high school Religious Beliefs/Practices: Unknown History of Abuse: none Occupational Experiences; cleaning houses Military History:  None. Legal History: none Hobbies/Interests: Walking with baby, reading  Family History:   Family History  Problem Relation Age of Onset  . Depression Mother   . Bipolar disorder Sister   . Drug abuse Paternal Aunt   . Cancer Paternal Grandmother   . Hypertension Paternal Grandmother   . Diabetes Paternal Grandmother   . Arthritis Paternal Grandmother   . Heart disease Paternal Grandmother     Mental Status Examination/Evaluation: Objective:  Appearance: Casual and Well Groomed  Eye Contact::  Good  Speech:  normal  Volume:normal  Mood:Fairly good   Affect: Bright   Thought Process:  Goal Directed  Orientation:  Full (Time, Place, and Person)  Thought Content: Within normal limits  Suicidal Thoughts:  No  Homicidal Thoughts:  No  Judgement:  Good  Insight:  Good  Psychomotor Activity: normal  Akathisia:  No  Handed:  Right  AIMS (if indicated):    Assets:  Communication Skills Desire for Improvement Social Support    Laboratory/X-Ray Psychological Evaluation(s)   Reviewed in record      Assessment:  Axis I: Generalized Anxiety Disorder, Major Depression, Recurrent severe and Obsessive Compulsive DisorderADD  AXIS I Generalized Anxiety Disorder,  Major Depression, Recurrent severe and Obsessive Compulsive Disorder ADD  AXIS II Deferred  AXIS III Past Medical History  Diagnosis Date  . IBS (irritable bowel syndrome)   . Acid reflux disease   . Major depression (HCC)   . OCD (obsessive compulsive disorder)   . Anxiety   . Interstitial cystitis   . Pregnancy   . 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 Abnormal pap   . History of gonorrhea 2013  . Reflux   . Reactive airway disease   . URI (upper respiratory infection)      AXIS IV other psychosocial or environmental problems  AXIS V 51-60 moderate symptoms   Treatment Plan/Recommendations:  Plan of Care: Medication management   Laboratory:   Psychotherapy: SheIs seeing a new therapist weekly   Medications: She will continue Paxil 40 mg daily for depression and Ativan 1 mg 3 times a day for anxiety. She will continue Latuda 40 mg with supper .She will start Adderall 20 mg daily for ADD   Routine PRN Medications:  No  Consultations:   Safety Concerns:  She denies thoughts of self-harm   Other:  She will return in 2 months     Diannia Ruder, MD 7/17/201710:45 AM

## 2016-01-16 ENCOUNTER — Ambulatory Visit (HOSPITAL_COMMUNITY): Payer: Self-pay | Admitting: Psychiatry

## 2016-02-28 ENCOUNTER — Telehealth (HOSPITAL_COMMUNITY): Payer: Self-pay | Admitting: *Deleted

## 2016-02-28 NOTE — Telephone Encounter (Signed)
LEFT VOICE MESSAGE, PROVIDER OUT OF OFFICE 03/15/16.

## 2016-03-15 ENCOUNTER — Ambulatory Visit (HOSPITAL_COMMUNITY): Payer: Self-pay | Admitting: Psychiatry

## 2016-03-26 ENCOUNTER — Ambulatory Visit (HOSPITAL_COMMUNITY): Payer: Self-pay | Admitting: Psychiatry

## 2016-03-26 ENCOUNTER — Encounter (HOSPITAL_COMMUNITY): Payer: Self-pay

## 2016-03-29 ENCOUNTER — Encounter (HOSPITAL_COMMUNITY): Payer: Self-pay | Admitting: Psychiatry

## 2016-03-29 ENCOUNTER — Ambulatory Visit (INDEPENDENT_AMBULATORY_CARE_PROVIDER_SITE_OTHER): Payer: Medicaid Other | Admitting: Psychiatry

## 2016-03-29 ENCOUNTER — Telehealth (HOSPITAL_COMMUNITY): Payer: Self-pay | Admitting: *Deleted

## 2016-03-29 VITALS — BP 108/57 | HR 86 | Ht 68.0 in | Wt 178.0 lb

## 2016-03-29 DIAGNOSIS — F411 Generalized anxiety disorder: Secondary | ICD-10-CM | POA: Diagnosis not present

## 2016-03-29 DIAGNOSIS — F329 Major depressive disorder, single episode, unspecified: Secondary | ICD-10-CM | POA: Diagnosis not present

## 2016-03-29 DIAGNOSIS — F429 Obsessive-compulsive disorder, unspecified: Secondary | ICD-10-CM

## 2016-03-29 DIAGNOSIS — F32A Depression, unspecified: Secondary | ICD-10-CM

## 2016-03-29 MED ORDER — AMPHETAMINE-DEXTROAMPHETAMINE 20 MG PO TABS: 20.0000 mg | ORAL_TABLET | Freq: Two times a day (BID) | ORAL | 0 refills | Status: DC

## 2016-03-29 MED ORDER — LURASIDONE HCL 40 MG PO TABS: 40.0000 mg | ORAL_TABLET | Freq: Every day | ORAL | 2 refills | Status: DC

## 2016-03-29 MED ORDER — PAROXETINE HCL 40 MG PO TABS: 40.0000 mg | ORAL_TABLET | Freq: Every day | ORAL | 2 refills | Status: DC

## 2016-03-29 NOTE — Telephone Encounter (Signed)
Per Verbal from Dr. Tenny Crawoss to call in new script for pt. Tracy Flores 1 mg TID 90 tab 2 refills. Called pt and informed her with information but is unable to reach her. Per message when called stated pt does not have a voicemail set up yet when called the home number. When called pt mobile, pt grandmother picked up and stated pt was not in and message was left with her to call office. Grandmother verbalized understanding.

## 2016-03-29 NOTE — Progress Notes (Signed)
Patient ID: Tracy Flores, female   DOB: 03/21/1991, 25 y.o.   MRN: [id number] Patient ID: Tracy Flores, female   DOB: 11/03/1990, 25 y.o.   MRN: [id number] Patient ID: Tracy Flores, female   DOB: 06/04/1991, 25 y.o.   MRN: [id number] Patient ID: Tracy Flores, female   DOB: 05/18/1991, 25 y.o.   MRN: [id number] Patient ID: Tracy Flores, female   DOB: 02/14/1991, 25 y.o.   MRN: [id number] Patient ID: Tracy Flores, female   DOB: 02/16/1991, 25 y.o.   MRN: [id number] Patient ID: Tracy Flores, female   DOB: 06/17/1991, 25 y.o.   MRN: [id number] Patient ID: Tracy Flores, female   DOB: 06/06/1991, 25 y.o.   MRN: [id number] Patient ID: Tracy Flores, female   DOB: 06/10/1991, 25 y.o.   MRN: [id number] Patient ID: Tracy FootmanMallory D Leonardo, female   DOB: 02/18/1991, 25 y.o.   MRN: [id number] Patient ID: Tracy FootmanMallory D Wint, female   DOB: 01/02/1991, 25 y.o.   MRN: [id number] Patient ID: Tracy FootmanMallory D Montenegro, female   DOB: 09/04/1990, 25 y.o.   MRN: [id number]  Psychiatric Assessment Adult  Patient Identification:  Tracy FootmanMallory D Pourciau Date of Evaluation:  03/29/2016 Chief Complaint: "I'm doing much better." History of Chief Complaint:   Chief Complaint  Patient presents with  . Depression  . Anxiety  . ADD  . Follow-up    Depression         Associated symptoms include fatigue.  Past medical history includes anxiety.   Anxiety  Symptoms include nervous/anxious behavior.     this patient is a 25 year old single white female who lives her boyfriend and  26 year old son in [place]. She works Education officer, environmentalcleaning houses. She is referred by Dr.-Luking's office for assessment of depression and anxiety  The patient states that her difficulties with depression and panic attacks started at age 25. She felt like she was being bullied at school and had a hard time going to school. Sometimes she was too depressed to go out of bed. She was seeing a Veterinary surgeoncounselor and a psychiatrist in [place] and was tried on numerous medications. She's had negative  reactions to several such as Zoloft not working Effexor making her worse Lamictal not working and Abilify causing restless legs.  In 2012 she had a particularly difficult year. She was hospitalized 3 times, once at the [facility] and twice at [facility]. Prior to her last hospitalization she had made a suicide attempt by trying to crash up her car. She did have a good response to Paxil and Xanax but stopped the medications when she became pregnant last year. Since having her baby she's been depressed again and the Paxil was restarted.  This helped for a while but lately she's been more depressed and not finding much joy in things. She used to be on a higher dose of Paxil. She is sleeping fairly well but her energy is low she is tired all the time. She does spend time with her sister and friends but is very uncomfortable going out in public around crowds or attending community college. She has panic attacks about once a week. She denies being suicidal and claims that this stopped when she had a child and was responsible for another person. She's not using drugs or alcohol. She's never had psychotic symptoms or manic symptoms. However she tends to worry and ruminate about the same things over and over and is constantly cleaning her house.  The  patient returns after 2 months. She continues to work in a [employer] and is really enjoying it. The Kasandra Knudsen has really helped her mood and Adderall is helping her stay focused. Sometimes she works double shifts and the Adderall wears off in the afternoon. She is taking in the afternoon at times and still is able to sleep. I told her I would give her a higher quantity so she could take 2 a day if necessary. She is generally sleeping well and her energy is good and she's no longer having difficulty getting around people. She denies any recent panic attacks Review of Systems  Constitutional: Positive for fatigue.  HENT: Negative.   Eyes:  Negative.   Respiratory: Negative.   Cardiovascular: Negative.   Gastrointestinal: Negative.   Endocrine: Negative.   Genitourinary: Negative.   Musculoskeletal: Negative.   Allergic/Immunologic: Negative.   Neurological: Negative.   Hematological: Negative.   Psychiatric/Behavioral: Positive for depression and dysphoric mood. The patient is nervous/anxious.    Physical Exam not done  Depressive Symptoms: depressed mood, anhedonia, psychomotor retardation, fatigue, difficulty concentrating, anxiety, panic attacks,  (Hypo) Manic Symptoms:   Elevated Mood:  No Irritable Mood:  Yes Grandiosity:  No Distractibility:  Yes Labiality of Mood:  No Delusions:  No Hallucinations:  No Impulsivity:  No Sexually Inappropriate Behavior:  No Financial Extravagance:  No Flight of Ideas:  No  Anxiety Symptoms: Excessive Worry:  Yes Panic Symptoms:  Yes Agoraphobia:  No Obsessive Compulsive: Yes  Symptoms: Cleans house compulsively Specific Phobias:  No Social Anxiety:  Yes  Psychotic Symptoms:  Hallucinations: No None Delusions:  No Paranoia:  No   Ideas of Reference:  No  PTSD Symptoms: Ever had a traumatic exposure:  No Had a traumatic exposure in the last month:  No Re-experiencing: No None Hypervigilance:  No Hyperarousal: No None Avoidance: No None  Traumatic Brain Injury: No   Past Psychiatric History: Diagnosis: Major depression, panic disorder, OCD   Hospitalizations: 3 times in 2012   Outpatient Care: Has seen a therapist in her teen years   Substance Abuse Care: none  Self-Mutilation: none  Suicidal Attempts: Once by trying to Crash her car   Violent Behaviors: none   Past Medical History:   Past Medical History:  Diagnosis Date  . Abnormal pap   . Acid reflux disease   . Anxiety   . History of gonorrhea 2013  . 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 IBS (irritable bowel syndrome)   . Interstitial cystitis   . Major depression (HCC)    . OCD (obsessive compulsive disorder)   . Pregnancy   . Reactive airway disease   . Reflux   . URI (upper respiratory infection)    History of Loss of Consciousness:  No Seizure History:  No Cardiac History:  No Allergies:   Allergies  Allergen Reactions  . Abilify [Aripiprazole]     Restless legs  . Celexa [Citalopram Hydrobromide]     Agitation   . Clindamycin/Lincomycin   . Penicillins Other (See Comments)    Unknown Childhood reaction  . Zoloft [Sertraline Hcl]     Suicidal thoughts  . Amoxil [Amoxicillin] Rash   Current Medications:  Current Outpatient Prescriptions  Medication Sig Dispense Refill  . amphetamine-dextroamphetamine (ADDERALL) 20 MG tablet Take 1 tablet (20 mg total) by mouth 2 (two) times daily. 60 tablet 0  . amphetamine-dextroamphetamine (ADDERALL) 20 MG tablet Take 1 tablet (20 mg total) by mouth 2 (two) times daily. 60  tablet 0  . loratadine (CLARITIN) 10 MG tablet Take 1 tablet (10 mg total) by mouth daily. 30 tablet 11  . LORazepam (ATIVAN) 1 MG tablet Take 1 tablet (1 mg total) by mouth 3 (three) times daily. 90 tablet 2  . lurasidone (LATUDA) 40 MG TABS tablet Take 1 tablet (40 mg total) by mouth daily with supper. 30 tablet 2  . PARoxetine (PAXIL) 40 MG tablet Take 1 tablet (40 mg total) by mouth daily. 30 tablet 2  . valACYclovir (VALTREX) 1000 MG tablet TAKE ONE (1) TABLET BY MOUTH EVERY DAY 30 tablet 6  . amphetamine-dextroamphetamine (ADDERALL) 20 MG tablet Take 1 tablet (20 mg total) by mouth 2 (two) times daily. 60 tablet 0   Current Facility-Administered Medications  Medication Dose Route Frequency Provider Last Rate Last Dose  . etonogestrel (IMPLANON) implant 68 mg  68 mg Subcutaneous Once Jacklyn Shell, CNM        Previous Psychotropic Medications:  Medication Dose   Zoloft, Effexor, Lamictal, Abilify, Xanax                        Substance Abuse History in the last 12 months: Substance Age of 1st Use Last Use  Amount Specific Type  Nicotine    smokes one half pack per day    Alcohol      Cannabis      Opiates      Cocaine      Methamphetamines      LSD      Ecstasy      Benzodiazepines      Caffeine      Inhalants      Others:                          Medical Consequences of Substance Abuse: n/a  Legal Consequences of Substance Abuse: n/a  Family Consequences of Substance Abuse: n/a  Blackouts:  No DT's:  No Withdrawal Symptoms:  No None  Social History: Current Place of Residence: [place] [address] of Birth: [place] Family Members: Parents, 2 sisters, boyfriend, son Marital Status:  Single Children:   Sons: 1  Daughters:  Relationships: Lives with boyfriend Education:  GED Educational Problems/Performance: Became too anxious and depressed to attend high school Religious Beliefs/Practices: Unknown History of Abuse: none Occupational Experiences; cleaning houses Military History:  None. Legal History: none Hobbies/Interests: Walking with baby, reading  Family History:   Family History  Problem Relation Age of Onset  . Depression Mother   . Bipolar disorder Sister   . Drug abuse Paternal Aunt   . Cancer Paternal Grandmother   . Hypertension Paternal Grandmother   . Diabetes Paternal Grandmother   . Arthritis Paternal Grandmother   . Heart disease Paternal Grandmother     Mental Status Examination/Evaluation: Objective:  Appearance: Casual and Well Groomed  Eye Contact::  Good  Speech:  normal  Volume:normal  Mood: good   Affect: Bright   Thought Process:  Goal Directed  Orientation:  Full (Time, Place, and Person)  Thought Content: Within normal limits  Suicidal Thoughts:  No  Homicidal Thoughts:  No  Judgement:  Good  Insight:  Good  Psychomotor Activity: normal  Akathisia:  No  Handed:  Right  AIMS (if indicated):    Assets:  Communication Skills Desire for Improvement Social Support    Laboratory/X-Ray  Psychological Evaluation(s)   Reviewed in record  Assessment:  Axis I: Generalized Anxiety Disorder, Major Depression, Recurrent severe and Obsessive Compulsive DisorderADD  AXIS I Generalized Anxiety Disorder, Major Depression, Recurrent severe and Obsessive Compulsive Disorder ADD  AXIS II Deferred  AXIS III Past Medical History:  Diagnosis Date  . Abnormal pap   . Acid reflux disease   . Anxiety   . History of gonorrhea 2013  . 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 IBS (irritable bowel syndrome)   . Interstitial cystitis   . Major depression (HCC)   . OCD (obsessive compulsive disorder)   . Pregnancy   . Reactive airway disease   . Reflux   . URI (upper respiratory infection)      AXIS IV other psychosocial or environmental problems  AXIS V 51-60 moderate symptoms   Treatment Plan/Recommendations:  Plan of Care: Medication management   Laboratory:   Psychotherapy: SheIs seeing a new therapist weekly   Medications: She will continue Paxil 40 mg daily for depression and Ativan 1 mg 3 times a day for anxiety. She will continue Latuda 40 mg with supper .She will start Adderall 20 mg daily for ADD   Routine PRN Medications:  No  Consultations:   Safety Concerns:  She denies thoughts of self-harm   Other:  She will return in 3 months     Diannia Ruder, MD 9/29/201710:16 AM

## 2016-04-03 ENCOUNTER — Encounter (HOSPITAL_COMMUNITY): Payer: Self-pay

## 2016-04-03 ENCOUNTER — Emergency Department (HOSPITAL_COMMUNITY)
Admission: EM | Admit: 2016-04-03 | Discharge: 2016-04-03 | Disposition: A | Discharge status: Home / Self Care | Payer: Medicaid Other | Attending: Emergency Medicine | Admitting: Emergency Medicine

## 2016-04-03 DIAGNOSIS — J45909 Unspecified asthma, uncomplicated: Secondary | ICD-10-CM | POA: Insufficient documentation

## 2016-04-03 DIAGNOSIS — F909 Attention-deficit hyperactivity disorder, unspecified type: Secondary | ICD-10-CM | POA: Insufficient documentation

## 2016-04-03 DIAGNOSIS — R110 Nausea: Secondary | ICD-10-CM | POA: Diagnosis present

## 2016-04-03 DIAGNOSIS — F17210 Nicotine dependence, cigarettes, uncomplicated: Secondary | ICD-10-CM | POA: Diagnosis not present

## 2016-04-03 DIAGNOSIS — G43009 Migraine without aura, not intractable, without status migrainosus: Secondary | ICD-10-CM | POA: Diagnosis not present

## 2016-04-03 MED ORDER — METOCLOPRAMIDE HCL 5 MG/ML IJ SOLN
10.0000 mg | Freq: Once | INTRAMUSCULAR | Status: AC
  Administered 2016-04-03: 10 mg via INTRAVENOUS
  Filled 2016-04-03: qty 2

## 2016-04-03 MED ORDER — DIPHENHYDRAMINE HCL 50 MG/ML IJ SOLN
25.0000 mg | Freq: Once | INTRAMUSCULAR | Status: AC
  Administered 2016-04-03: 25 mg via INTRAVENOUS
  Filled 2016-04-03: qty 1

## 2016-04-03 MED ORDER — SODIUM CHLORIDE 0.9 % IV BOLUS (SEPSIS)
1000.0000 mL | Freq: Once | INTRAVENOUS | Status: AC
  Administered 2016-04-03: 1000 mL via INTRAVENOUS

## 2016-04-03 NOTE — ED Triage Notes (Signed)
Sinus congestion X5 days, and headache started last night with nausea. A&OX4 ambulatory without deficit to patient room

## 2016-04-03 NOTE — ED Provider Notes (Signed)
[facility]PT Provider Note   CSN: [id number] Arrival date & time: 04/03/16  0020  Time seen 01:20 AM   History   Chief Complaint Chief Complaint  Patient presents with  . Headache    HPI Tracy Flores is a 25 y.o. female.  HPI patient reports past few days she's had some sneezing, dry cough without sore throat. She states tonight at 10:30 PM after getting home from work she started having pain around her left eye that radiates up into her left forehead. She states her skin is burning in her scalp when she touches her skin, and she states her hair hurts when she touches her hair. She describes the constant pain as throbbing. She's had nausea without vomiting. She reports photophobia and noise sensitivity. She denies any fever, chills, or neck pain. She states she's never had this pain before. She denies being under any extra stress. Grandmother who is in the room states she has bad migraine headaches and has had to have Botox injections.  PCP Dr Gerda Diss  Past Medical History:  Diagnosis Date  . Abnormal pap   . Acid reflux disease   . Anxiety   . History of gonorrhea 2013  . 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 IBS (irritable bowel syndrome)   . Interstitial cystitis   . Major depression   . OCD (obsessive compulsive disorder)   . Pregnancy   . Reactive airway disease   . Reflux   . URI (upper respiratory infection)     Patient Active Problem List   Diagnosis Date Noted  . Insertion of Nexplanon 10/25/2015  . Insomnia 06/02/2015  . ADD (attention deficit disorder) 03/26/2013  . Depression 11/13/2012  . Generalized anxiety disorder 11/13/2012  . OCD (obsessive compulsive disorder) 11/13/2012  . Genital herpes 11/13/2012  . Nexplanon insertion 10/27/2012    Past Surgical History:  Procedure Laterality Date  . NO PAST SURGERIES      OB History    Gravida Para Term Preterm AB Living   1 1 1     1    SAB TAB Ectopic Multiple Live Births        1       Home Medications    Prior to Admission medications   Medication Sig Start Date End Date Taking? Authorizing Provider  amphetamine-dextroamphetamine (ADDERALL) 20 MG tablet Take 1 tablet (20 mg total) by mouth 2 (two) times daily. 03/29/16 03/29/17  Myrlene Broker, MD  amphetamine-dextroamphetamine (ADDERALL) 20 MG tablet Take 1 tablet (20 mg total) by mouth 2 (two) times daily. 03/29/16 03/29/17  Myrlene Broker, MD  amphetamine-dextroamphetamine (ADDERALL) 20 MG tablet Take 1 tablet (20 mg total) by mouth 2 (two) times daily. 03/29/16 03/29/17  Myrlene Broker, MD  loratadine (CLARITIN) 10 MG tablet Take 1 tablet (10 mg total) by mouth daily. 05/29/15   Campbell Riches, NP  LORazepam (ATIVAN) 1 MG tablet Take 1 tablet (1 mg total) by mouth 3 (three) times daily. 01/15/16 01/14/17  Myrlene Broker, MD  lurasidone (LATUDA) 40 MG TABS tablet Take 1 tablet (40 mg total) by mouth daily with supper. 03/29/16   Myrlene Broker, MD  PARoxetine (PAXIL) 40 MG tablet Take 1 tablet (40 mg total) by mouth daily. 03/29/16 03/29/17  Myrlene Broker, MD  valACYclovir (VALTREX) 1000 MG tablet TAKE ONE (1) TABLET BY MOUTH EVERY DAY 07/04/14   Babs Sciara, MD    Family History Family History  Problem Relation  Age of Onset  . Depression Mother   . Bipolar disorder Sister   . Drug abuse Paternal Aunt   . Cancer Paternal Grandmother   . Hypertension Paternal Grandmother   . Diabetes Paternal Grandmother   . Arthritis Paternal Grandmother   . Heart disease Paternal Grandmother     Social History Social History  Substance Use Topics  . Smoking status: Current Every Day Smoker    Packs/day: 0.50    Years: 5.00    Types: Cigarettes  . Smokeless tobacco: Never Used  . Alcohol use No     Comment: very little not often  employed   Allergies   Abilify [aripiprazole]; Celexa [citalopram hydrobromide]; Clindamycin/lincomycin; Penicillins; Zoloft [sertraline hcl]; and Amoxil [amoxicillin]   Review  of Systems Review of Systems  All other systems reviewed and are negative.    Physical Exam Updated Vital Signs BP 124/72 (BP Location: Left Arm)   Pulse 76   Temp 98.1 F (36.7 C) (Oral)   Resp 18   Ht 5\' 7"  (1.702 m)   Wt 177 lb (80.3 kg)   LMP 03/20/2016   SpO2 100%   BMI 27.72 kg/m   Vital signs normal    Physical Exam  Constitutional: She is oriented to person, place, and time. She appears well-developed and well-nourished.  Non-toxic appearance. She does not appear ill. No distress.  HENT:  Head: Normocephalic and atraumatic.  Right Ear: External ear normal.  Left Ear: External ear normal.  Nose: Nose normal. No mucosal edema or rhinorrhea.  Mouth/Throat: Oropharynx is clear and moist and mucous membranes are normal. No dental abscesses or uvula swelling.  nontender frontal sinuses, mildly tender over the left maxillary sinus  Eyes: Conjunctivae and EOM are normal. Pupils are equal, round, and reactive to light.  Neck: Normal range of motion and full passive range of motion without pain. Neck supple.  Cardiovascular: Normal rate, regular rhythm and normal heart sounds.  Exam reveals no gallop and no friction rub.   No murmur heard. Pulmonary/Chest: Effort normal and breath sounds normal. No respiratory distress. She has no wheezes. She has no rhonchi. She has no rales. She exhibits no tenderness and no crepitus.  Abdominal: Soft. Normal appearance and bowel sounds are normal. She exhibits no distension. There is no tenderness. There is no rebound and no guarding.  Musculoskeletal: Normal range of motion. She exhibits no edema or tenderness.  Moves all extremities well.   Neurological: She is alert and oriented to person, place, and time. She has normal strength. No cranial nerve deficit.  Skin: Skin is warm, dry and intact. No rash noted. No erythema. No pallor.  Psychiatric: She has a normal mood and affect. Her speech is normal and behavior is normal. Her mood  appears not anxious.  Nursing note and vitals reviewed.    ED Treatments / Results  Labs (all labs ordered are listed, but only abnormal results are displayed) Labs Reviewed - No data to display  EKG  EKG Interpretation None       Radiology No results found.  Procedures Procedures (including critical care time)  Medications Ordered in ED Medications  sodium chloride 0.9 % bolus 1,000 mL (0 mLs Intravenous Stopped 04/03/16 0247)  metoCLOPramide (REGLAN) injection 10 mg (10 mg Intravenous Given 04/03/16 0145)  diphenhydrAMINE (BENADRYL) injection 25 mg (25 mg Intravenous Given 04/03/16 0145)     Initial Impression / Assessment and Plan / ED Course  I have reviewed the triage vital signs and the  nursing notes.  Pertinent labs & imaging results that were available during my care of the patient were reviewed by me and considered in my medical decision making (see chart for details).  Clinical Course   Patient describes a headache typical of migraine-type headache. She has family history of same. She was given migraine cocktail.  Recheck at 2:45 AM patient is sitting with her eyes open and a room with the lights on. She states her headache is much improved and almost gone. We discussed this is a migraine type headache. She can continue her Claritin for her allergy type symptoms with the sneezing and nonproductive cough. She should let her doctor know she gets a fever.  Final Clinical Impressions(s) / ED Diagnoses   Final diagnoses:  Migraine without aura and without status migrainosus, not intractable    New Prescriptions OTC claritin  Plan discharge  Devoria AlbeIva Shaunae Sieloff, MD, Concha PyoFACEP    Akiah Bauch, MD 04/03/16 [phone]

## 2016-04-03 NOTE — Discharge Instructions (Signed)
Get your claritin refilled or you can also buy it OTC. Go home and go to bed. You can take ibuprofen 600 mg and/or acetaminophen 1000 mg every 6 hrs for headache as needed. Recheck if you get a fever or seem worse.

## 2016-04-06 ENCOUNTER — Emergency Department (HOSPITAL_COMMUNITY): Payer: Medicaid Other

## 2016-04-06 ENCOUNTER — Emergency Department (HOSPITAL_COMMUNITY)
Admission: EM | Admit: 2016-04-06 | Discharge: 2016-04-06 | Disposition: A | Discharge status: Home / Self Care | Payer: Medicaid Other | Attending: Emergency Medicine | Admitting: Emergency Medicine

## 2016-04-06 ENCOUNTER — Encounter (HOSPITAL_COMMUNITY): Payer: Self-pay | Admitting: Emergency Medicine

## 2016-04-06 DIAGNOSIS — Y939 Activity, unspecified: Secondary | ICD-10-CM | POA: Insufficient documentation

## 2016-04-06 DIAGNOSIS — F17210 Nicotine dependence, cigarettes, uncomplicated: Secondary | ICD-10-CM | POA: Insufficient documentation

## 2016-04-06 DIAGNOSIS — Y929 Unspecified place or not applicable: Secondary | ICD-10-CM | POA: Diagnosis not present

## 2016-04-06 DIAGNOSIS — W1839XA Other fall on same level, initial encounter: Secondary | ICD-10-CM | POA: Diagnosis not present

## 2016-04-06 DIAGNOSIS — S6991XA Unspecified injury of right wrist, hand and finger(s), initial encounter: Secondary | ICD-10-CM | POA: Diagnosis present

## 2016-04-06 DIAGNOSIS — Y999 Unspecified external cause status: Secondary | ICD-10-CM | POA: Insufficient documentation

## 2016-04-06 DIAGNOSIS — F909 Attention-deficit hyperactivity disorder, unspecified type: Secondary | ICD-10-CM | POA: Insufficient documentation

## 2016-04-06 DIAGNOSIS — S52601A Unspecified fracture of lower end of right ulna, initial encounter for closed fracture: Secondary | ICD-10-CM | POA: Diagnosis not present

## 2016-04-06 MED ORDER — HYDROCODONE-ACETAMINOPHEN 5-325 MG PO TABS
1.0000 | ORAL_TABLET | Freq: Once | ORAL | Status: AC
  Administered 2016-04-06: 1 via ORAL
  Filled 2016-04-06: qty 1

## 2016-04-06 MED ORDER — HYDROCODONE-ACETAMINOPHEN 5-325 MG PO TABS
1.0000 | ORAL_TABLET | Freq: Four times a day (QID) | ORAL | 0 refills | Status: DC | PRN
Start: 2016-04-06 — End: 2016-04-09

## 2016-04-06 NOTE — ED Provider Notes (Signed)
[facility] DEPT Provider Note   CSN: [id number] Arrival date & time: 04/06/16  1713     History   Chief Complaint Chief Complaint  Patient presents with  . Wrist Injury    HPI Tracy Flores is a 25 y.o. female.  [name] is a 25 y.o. female with h/o anxiety, IBS, depression, acid reflux, OCD presents to ED with coamplint or right wrist pain. Patient reports last night she stumbled over a fire truck and fell, breaking her fall with her right wrist. Pain is located in the ulnar aspect of her right wrist, described as a "pulling/stretching" sensation. She endorses associated swelling, weakness, and tingling in her fingers. Pain is worse with movement or if arm is hanging down. No alleviating factors. She denies fever, redness, warmth, loss of sensation, or discoloration. She did not hit her head. No LOC. No treatments tried PTA.       Past Medical History:  Diagnosis Date  . Abnormal pap   . Acid reflux disease   . Anxiety   . History of gonorrhea 2013  . 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 IBS (irritable bowel syndrome)   . Interstitial cystitis   . Major depression   . OCD (obsessive compulsive disorder)   . Pregnancy   . Reactive airway disease   . Reflux   . URI (upper respiratory infection)     Patient Active Problem List   Diagnosis Date Noted  . Insertion of Nexplanon 10/25/2015  . Insomnia 06/02/2015  . ADD (attention deficit disorder) 03/26/2013  . Depression 11/13/2012  . Generalized anxiety disorder 11/13/2012  . OCD (obsessive compulsive disorder) 11/13/2012  . Genital herpes 11/13/2012  . Nexplanon insertion 10/27/2012    Past Surgical History:  Procedure Laterality Date  . NO PAST SURGERIES      OB History    Gravida Para Term Preterm AB Living   1 1 1     1    SAB TAB Ectopic Multiple Live Births           1       Home Medications    Prior to Admission medications   Medication Sig Start Date End Date Taking?  Authorizing Provider  amphetamine-dextroamphetamine (ADDERALL) 20 MG tablet Take 1 tablet (20 mg total) by mouth 2 (two) times daily. 03/29/16 03/29/17  Myrlene Broker, MD  amphetamine-dextroamphetamine (ADDERALL) 20 MG tablet Take 1 tablet (20 mg total) by mouth 2 (two) times daily. 03/29/16 03/29/17  Myrlene Broker, MD  amphetamine-dextroamphetamine (ADDERALL) 20 MG tablet Take 1 tablet (20 mg total) by mouth 2 (two) times daily. 03/29/16 03/29/17  Myrlene Broker, MD  HYDROcodone-acetaminophen (NORCO/VICODIN) 5-325 MG tablet Take 1 tablet by mouth every 6 (six) hours as needed. 04/06/16   Lona Kettle, PA-C  loratadine (CLARITIN) 10 MG tablet Take 1 tablet (10 mg total) by mouth daily. 05/29/15   Campbell Riches, NP  LORazepam (ATIVAN) 1 MG tablet Take 1 tablet (1 mg total) by mouth 3 (three) times daily. 01/15/16 01/14/17  Myrlene Broker, MD  lurasidone (LATUDA) 40 MG TABS tablet Take 1 tablet (40 mg total) by mouth daily with supper. 03/29/16   Myrlene Broker, MD  PARoxetine (PAXIL) 40 MG tablet Take 1 tablet (40 mg total) by mouth daily. 03/29/16 03/29/17  Myrlene Broker, MD  valACYclovir (VALTREX) 1000 MG tablet TAKE ONE (1) TABLET BY MOUTH EVERY DAY 07/04/14   Babs Sciara, MD  Family History Family History  Problem Relation Age of Onset  . Depression Mother   . Bipolar disorder Sister   . Drug abuse Paternal Aunt   . Cancer Paternal Grandmother   . Hypertension Paternal Grandmother   . Diabetes Paternal Grandmother   . Arthritis Paternal Grandmother   . Heart disease Paternal Grandmother     Social History Social History  Substance Use Topics  . Smoking status: Current Every Day Smoker    Packs/day: 0.50    Years: 5.00    Types: Cigarettes  . Smokeless tobacco: Never Used  . Alcohol use No     Allergies   Abilify [aripiprazole]; Celexa [citalopram hydrobromide]; Clindamycin/lincomycin; Penicillins; Zoloft [sertraline hcl]; and Amoxil [amoxicillin]   Review of  Systems Review of Systems  Constitutional: Negative for fever.  Musculoskeletal: Positive for arthralgias and joint swelling.  Skin: Negative for color change.  Neurological: Negative for syncope, weakness and numbness.       Endorses paresthesias     Physical Exam Updated Vital Signs BP 125/65 (BP Location: Left Arm)   Pulse 76   Temp 98.1 F (36.7 C) (Oral)   Resp 18   Ht 5\' 7"  (1.702 m)   Wt 80.3 kg   LMP 03/20/2016   SpO2 100%   BMI 27.72 kg/m   Physical Exam  Constitutional: She appears well-developed and well-nourished. No distress.  HENT:  Head: Normocephalic and atraumatic.  Eyes: Conjunctivae are normal. No scleral icterus.  Neck: Normal range of motion.  Cardiovascular: Regular rhythm and intact distal pulses.   Pulmonary/Chest: Effort normal. No respiratory distress.  Abdominal: She exhibits no distension.  Musculoskeletal:  Right wrist: obvious swelling noted. No erythema or discoloration appreciated. TTP along ulnar aspect of distal forearm. Patient able to make a fist. Sensation intact. 2+ radial pulse. Capillary refill intact.   Neurological: She is alert.  Skin: Skin is warm and dry. She is not diaphoretic.  Psychiatric: She has a normal mood and affect. Her behavior is normal.     ED Treatments / Results  Labs (all labs ordered are listed, but only abnormal results are displayed) Labs Reviewed - No data to display  EKG  EKG Interpretation None       Radiology Dg Wrist Complete Right  Result Date: 04/06/2016 CLINICAL DATA:  Initial evaluation for acute pain at distal ulna status post fall last night. EXAM: RIGHT WRIST - COMPLETE 3+ VIEW COMPARISON:  None. FINDINGS: There is an acute comminuted minimally displaced fracture through the distal ulnar shaft. Probable intra-articular extension into the DRUJ. Overlying soft tissue swelling present. Visualize radius intact. IMPRESSION: Acute comminuted minimally displaced fracture of the distal ulnar  shaft. Electronically Signed   By: Rise MuBenjamin  McClintock M.D.   On: 04/06/2016 17:56    Procedures Procedures (including critical care time)  Medications Ordered in ED Medications  HYDROcodone-acetaminophen (NORCO/VICODIN) 5-325 MG per tablet 1 tablet (1 tablet Oral Given 04/06/16 1753)     Initial Impression / Assessment and Plan / ED Course  I have reviewed the triage vital signs and the nursing notes.  Pertinent labs & imaging results that were available during my care of the patient were reviewed by me and considered in my medical decision making (see chart for details).  Clinical Course  Value Comment By Time  DG Wrist Complete Right Fracture of distal ulna noted Lona Kettleshley Laurel Margrette Wynia, PA-C 10/07 1749    Patient presents to ED with complaint of right wrist pain s/p fall. Patient is afebrile  and non-toxic appearing in NAD. VSS. Obvious swelling noted to right wrist with TTP along ulnar aspect of distal forearm. Neurovascularly intact. X-ray remarkable for fracture of distal right ulna. Discussed results and plan with patient. Patient placed in sugar tong splint and given arm sling. Rx vicodin for severe pain. Review of [place] controlled substance database shows not recent narcotic rx. Follow up with orthopedics ASAP,  Contact information provided. Return precautions discussed. Pt voiced understanding and is agreeable.    Final Clinical Impressions(s) / ED Diagnoses   Final diagnoses:  Closed fracture of distal end of right ulna, unspecified fracture morphology, initial encounter    New Prescriptions New Prescriptions   HYDROCODONE-ACETAMINOPHEN (NORCO/VICODIN) 5-325 MG TABLET    Take 1 tablet by mouth every 6 (six) hours as needed.     Lona Kettle, PA-C 04/06/16 1824    Melene Plan, DO 04/07/16 0002

## 2016-04-06 NOTE — ED Notes (Signed)
Pt removed ring on injured hand, placed in bag and into purse at this time.

## 2016-04-06 NOTE — ED Triage Notes (Signed)
Patient c/o right wrist pain and swelling. Per patient fell last night landing on wrist, pinning wrist between body and a toy fire truck.

## 2016-04-06 NOTE — Discharge Instructions (Signed)
Read the information below.  You have a fractured forearm. You are being placed in a splint. I have prescribed vicodin for severe pain. You can apply ice for 20 minute increments.  It is very important that you follow up with orthopedics. I have provided the contact information. Please call on Monday to schedule an appointment.  Use the prescribed medication as directed.  Please discuss all new medications with your pharmacist.   You may return to the Emergency Department at any time for worsening condition or any new symptoms that concern you. Return to ED if you develop loss of sensation, uncontrolled pain, change in color of your fingers or any other new/concerning symptoms.

## 2016-04-09 ENCOUNTER — Encounter: Payer: Self-pay | Admitting: Orthopedic Surgery

## 2016-04-09 ENCOUNTER — Ambulatory Visit (INDEPENDENT_AMBULATORY_CARE_PROVIDER_SITE_OTHER): Payer: Medicaid Other | Admitting: Orthopedic Surgery

## 2016-04-09 VITALS — BP 110/69 | HR 82 | Ht 67.5 in | Wt 175.0 lb

## 2016-04-09 DIAGNOSIS — S52254A Nondisplaced comminuted fracture of shaft of ulna, right arm, initial encounter for closed fracture: Secondary | ICD-10-CM

## 2016-04-09 MED ORDER — HYDROCODONE-ACETAMINOPHEN 5-325 MG PO TABS: 1.0000 | ORAL_TABLET | ORAL | 0 refills | Status: DC | PRN

## 2016-04-09 MED ORDER — PROMETHAZINE HCL 12.5 MG PO TABS: 12.5000 mg | ORAL_TABLET | ORAL | 2 refills | Status: DC | PRN

## 2016-04-09 NOTE — Patient Instructions (Signed)
OOW TODAY  RETURN TOMORROW CASH REGISTER X 6 WEEKS

## 2016-04-09 NOTE — Progress Notes (Signed)
Chief Complaint  Patient presents with  . Follow-up    ER follow up on right wrist fracture, DOI 04-05-16.   HPI   Date of injury was 04/05/2016. Patient fell at home. Sure which she landed on.  Went to the ER for evaluation and x-rays showed a distal ulnar fracture  Complains of pain relieved by Norco 5 mg but that makes her nauseous  She has moderate pain over the right distal ulna associated with swelling.  Review of Systems  Constitutional: Negative for chills, fever and weight loss.  Respiratory: Negative for shortness of breath.   Cardiovascular: Negative for chest pain.  Neurological: Negative for tingling.    Past Medical History:  Diagnosis Date  . Abnormal pap   . Acid reflux disease   . Anxiety   . History of gonorrhea 2013  . 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 IBS (irritable bowel syndrome)   . Interstitial cystitis   . Major depression   . OCD (obsessive compulsive disorder)   . Pregnancy   . Reactive airway disease   . Reflux   . URI (upper respiratory infection)     Past Surgical History:  Procedure Laterality Date  . NO PAST SURGERIES     Family History  Problem Relation Age of Onset  . Depression Mother   . Bipolar disorder Sister   . Drug abuse Paternal Aunt   . Cancer Paternal Grandmother   . Hypertension Paternal Grandmother   . Diabetes Paternal Grandmother   . Arthritis Paternal Grandmother   . Heart disease Paternal Grandmother    Social History  Substance Use Topics  . Smoking status: Current Every Day Smoker    Packs/day: 0.50    Years: 5.00    Types: Cigarettes  . Smokeless tobacco: Never Used  . Alcohol use No   No outpatient prescriptions have been marked as taking for the 04/09/16 encounter (Office Visit) with Vickki HearingStanley E Zuleyka Kloc, MD.   Current Facility-Administered Medications for the 04/09/16 encounter (Office Visit) with Vickki HearingStanley E Vee Bahe, MD  Medication  . etonogestrel (IMPLANON) implant 68 mg     BP 110/69   Pulse 82   Ht 5' 7.5" (1.715 m)   Wt 175 lb (79.4 kg)   LMP 03/20/2016   BMI 27.00 kg/m   Physical Exam  Constitutional: She is oriented to person, place, and time. She appears well-developed and well-nourished. No distress.  Cardiovascular: Normal rate and intact distal pulses.   Neurological: She is alert and oriented to person, place, and time. She has normal reflexes. She exhibits normal muscle tone. Coordination normal.  Skin: Skin is warm and dry. No rash noted. She is not diaphoretic. No erythema. No pallor.  Psychiatric: She has a normal mood and affect. Her behavior is normal. Judgment and thought content normal.    Ortho Exam She has normal ambulation  Her right wrist is tender swollen with ecchymotic skin this tenderness is over the distal ulna. It does not affect her flexion extension at the wrist. She has good strength in the hand in terms of grip. Her elbow is nontender. Her humerus is nontender. Her shoulder is nontender.  She has no subluxation or dislocation in the right upper extremity  In comparison her left upper extremity primarily wrist area shows normal range of motion stability strength and alignment  ASSESSMENT: My personal interpretation of the images:  On x-ray I see a comminuted distal ulnar fracture which appears to be a nightstick variant  Encounter  Diagnosis  Name Primary?  . Closed nondisplaced comminuted fracture of shaft of right ulna, initial encounter Yes       PLAN Recommend short arm cast  Phenergan and Norco.  X-ray out of plaster 6 weeks  Fuller Canada, MD 04/09/2016 2:39 PM  .meds

## 2016-05-21 ENCOUNTER — Ambulatory Visit (INDEPENDENT_AMBULATORY_CARE_PROVIDER_SITE_OTHER): Payer: Medicaid Other | Admitting: Orthopedic Surgery

## 2016-05-21 ENCOUNTER — Ambulatory Visit (INDEPENDENT_AMBULATORY_CARE_PROVIDER_SITE_OTHER): Payer: Medicaid Other

## 2016-05-21 DIAGNOSIS — S52254D Nondisplaced comminuted fracture of shaft of ulna, right arm, subsequent encounter for closed fracture with routine healing: Secondary | ICD-10-CM | POA: Diagnosis not present

## 2016-05-21 NOTE — Progress Notes (Signed)
Patient ID: Tracy Flores, female   DOB: 03/13/1991, 25 y.o.   MRN: [id number]  fracture care f/u   Chief Complaint  Patient presents with  . Follow-up    6 week recheck on right wrist with xray OOP, DOI 04-05-16.   6 weeks post cast for distal ulna shaft fracture   The x-ray shows a fracture healed. The patient has full range of motion wrist hand and elbow  No tenderness at the fracture site  Patient released follow-up as needed

## 2016-06-11 ENCOUNTER — Ambulatory Visit (HOSPITAL_COMMUNITY): Payer: Medicaid Other | Admitting: Psychiatry

## 2016-11-04 ENCOUNTER — Encounter: Payer: Self-pay | Admitting: Family Medicine

## 2016-11-04 ENCOUNTER — Ambulatory Visit (INDEPENDENT_AMBULATORY_CARE_PROVIDER_SITE_OTHER): Payer: Medicaid Other | Admitting: Family Medicine

## 2016-11-04 VITALS — BP 120/70 | Ht 67.5 in | Wt 197.2 lb

## 2016-11-04 DIAGNOSIS — J019 Acute sinusitis, unspecified: Secondary | ICD-10-CM | POA: Diagnosis not present

## 2016-11-04 DIAGNOSIS — J301 Allergic rhinitis due to pollen: Secondary | ICD-10-CM

## 2016-11-04 MED ORDER — DOXYCYCLINE HYCLATE 100 MG PO CAPS: 100.0000 mg | ORAL_CAPSULE | Freq: Two times a day (BID) | ORAL | 0 refills | Status: DC

## 2016-11-04 MED ORDER — FLUTICASONE PROPIONATE 50 MCG/ACT NA SUSP: 2.0000 | Freq: Every day | NASAL | 5 refills | Status: DC

## 2016-11-04 NOTE — Progress Notes (Signed)
   Subjective:    Patient ID: Tracy Flores, female    DOB: 09/26/1990, 26 y.o.   MRN: [id number]  Cough  This is a new problem. The current episode started in the past 7 days. Associated symptoms include headaches, rhinorrhea and a sore throat. Treatments tried: Zyrtec    Patient with allergies over the past few weeks head congestion drainage coughing now with sinus pressure congestion coughing and chest congestion denies wheezing difficulty breathing no vomiting or diarrhea Patient states no other concerns this visit.  Review of Systems  HENT: Positive for rhinorrhea and sore throat.   Respiratory: Positive for cough.   Neurological: Positive for headaches.       Objective:   Physical Exam  Throat nonerythematous eardrums normal lungs clear heart regular HEENT benign      Assessment & Plan:  Viral syndrome Allergic rhinitis Antibiotics prescribed warning signs discussed Follow-up if problems

## 2016-11-05 ENCOUNTER — Telehealth: Payer: Self-pay | Admitting: Family Medicine

## 2016-11-05 NOTE — Telephone Encounter (Signed)
Grandma said Tracy Flores seems worse today.  Coughing a lot.  Wanted to know if we can call in something for cough. [facility]

## 2016-11-05 NOTE — Telephone Encounter (Signed)
Patient seen yesterday and prescribed doxycycline and states the cough is worse and would like something for cough sent in

## 2016-11-05 NOTE — Telephone Encounter (Signed)
Tessalon 100 mg 1 3 times a day when necessary, #21, 2 refills

## 2016-11-06 MED ORDER — BENZONATATE 100 MG PO CAPS: 100.0000 mg | ORAL_CAPSULE | Freq: Three times a day (TID) | ORAL | 2 refills | Status: DC | PRN

## 2016-11-06 NOTE — Telephone Encounter (Addendum)
Prescriptions sent electronically to pharmacy. Patient notified. °

## 2017-03-17 ENCOUNTER — Ambulatory Visit (INDEPENDENT_AMBULATORY_CARE_PROVIDER_SITE_OTHER): Payer: Medicaid Other | Admitting: Nurse Practitioner

## 2017-03-17 ENCOUNTER — Encounter: Payer: Self-pay | Admitting: Nurse Practitioner

## 2017-03-17 VITALS — BP 118/74 | Ht 67.5 in | Wt 203.0 lb

## 2017-03-17 DIAGNOSIS — R5383 Other fatigue: Secondary | ICD-10-CM

## 2017-03-17 DIAGNOSIS — R635 Abnormal weight gain: Secondary | ICD-10-CM

## 2017-03-17 DIAGNOSIS — Z23 Encounter for immunization: Secondary | ICD-10-CM

## 2017-03-17 MED ORDER — PHENTERMINE HCL 37.5 MG PO TABS: 37.5000 mg | ORAL_TABLET | Freq: Every day | ORAL | 0 refills | Status: DC

## 2017-03-18 ENCOUNTER — Encounter: Payer: Self-pay | Admitting: Nurse Practitioner

## 2017-03-18 NOTE — Progress Notes (Signed)
Subjective:  Presents to discuss recent fatigue. States her depression and anxiety have been stable off medication. Has also stopped her Adderall. Has been making notes as a reminder to help keep her focused and completing tasks. Also states the Adderall made her jumpy" did not like it". Currently on Nexplanon for birth control. Has occasional mild cycle. Works at a [employer]. Tries to eat healthy. Has not changed her eating habits. No regular exercise outside of working. Limited time due to working and taking care of her child. The only difference is patient is drinking a large amount of sweet tea especially while at work. Depression screen [facility] 2/9 03/17/2017  Decreased Interest 1  Down, Depressed, Hopeless 0  PHQ - 2 Score 1  Altered sleeping 0  Tired, decreased energy 3  Change in appetite 1  Feeling bad or failure about yourself  0  Trouble concentrating 1  Moving slowly or fidgety/restless 0  Suicidal thoughts 0  PHQ-9 Score 6  Difficult doing work/chores Somewhat difficult    Objective:   BP 118/74   Ht 5' 7.5" (1.715 m)   Wt 203 lb (92.1 kg)   BMI 31.33 kg/m  NAD. Alert, oriented. Cheerful, affect. Thoughts logical coherent and relevant. Dressed appropriately. Making good eye contact. Thyroid nontender to palpation, no mass or goiter noted. Lungs clear. Heart regular rate rhythm. Has gained 42 pounds since May 2017.  Assessment:  Fatigue, unspecified type - Plan: Lipid panel, Hepatic function panel, Basic metabolic panel, TSH, CBC with Differential/Platelet  Rapid weight gain - Plan: Lipid panel, Hepatic function panel, Basic metabolic panel, TSH, CBC with Differential/Platelet  Need for vaccination - Plan: Flu Vaccine QUAD 6+ mos PF IM (Fluarix Quad PF)    Plan:   Meds ordered this encounter  Medications  . phentermine (ADIPEX-P) 37.5 MG tablet    Sig: Take 1 tablet (37.5 mg total) by mouth daily before breakfast.    Dispense:  30 tablet    Refill:  0    Order Specific  Question:   Supervising Provider    Answer:   Merlyn Albert [[id number]]   Labs pending. Hold on phentermine until results are available. If she starts phentermine, recheck in one month. Reviewed potential adverse effects. DC med and call if any problems.  Defers restarting any of her medications for depression.

## 2017-03-19 LAB — LIPID PANEL
CHOLESTEROL TOTAL: 142 mg/dL (ref 100–199)
Chol/HDL Ratio: 3.1 ratio (ref 0.0–4.4)
HDL: 46 mg/dL (ref >39)
LDL Calculated: 87 mg/dL (ref 0–99)
Triglycerides: 43 mg/dL (ref 0–149)
VLDL CHOLESTEROL CAL: 9 mg/dL (ref 5–40)

## 2017-03-19 LAB — HEPATIC FUNCTION PANEL
ALK PHOS: 118 IU/L — AB (ref 39–117)
ALT: 16 IU/L (ref 0–32)
AST: 18 IU/L (ref 0–40)
Albumin: 4.7 g/dL (ref 3.5–5.5)
Bilirubin Total: 0.2 mg/dL (ref 0.0–1.2)
Bilirubin, Direct: 0.09 mg/dL (ref 0.00–0.40)
TOTAL PROTEIN: 7 g/dL (ref 6.0–8.5)

## 2017-03-19 LAB — BASIC METABOLIC PANEL
BUN/Creatinine Ratio: 14 (ref 9–23)
BUN: 11 mg/dL (ref 6–20)
CO2: 23 mmol/L (ref 20–29)
Calcium: 9.1 mg/dL (ref 8.7–10.2)
Chloride: 105 mmol/L (ref 96–106)
Creatinine, Ser: 0.8 mg/dL (ref 0.57–1.00)
GFR calc Af Amer: 117 mL/min/{1.73_m2} (ref >59)
GFR calc non Af Amer: 101 mL/min/{1.73_m2} (ref >59)
GLUCOSE: 97 mg/dL (ref 65–99)
POTASSIUM: 4.5 mmol/L (ref 3.5–5.2)
SODIUM: 141 mmol/L (ref 134–144)

## 2017-03-19 LAB — CBC WITH DIFFERENTIAL/PLATELET
BASOS ABS: 0 10*3/uL (ref 0.0–0.2)
Basos: 0 %
EOS (ABSOLUTE): 0.2 10*3/uL (ref 0.0–0.4)
EOS: 3 %
HEMATOCRIT: 42.5 % (ref 34.0–46.6)
HEMOGLOBIN: 14.1 g/dL (ref 11.1–15.9)
Immature Grans (Abs): 0 10*3/uL (ref 0.0–0.1)
Immature Granulocytes: 0 %
LYMPHS ABS: 2.2 10*3/uL (ref 0.7–3.1)
LYMPHS: 31 %
MCH: 28.3 pg (ref 26.6–33.0)
MCHC: 33.2 g/dL (ref 31.5–35.7)
MCV: 85 fL (ref 79–97)
MONOCYTES: 8 %
Monocytes Absolute: 0.5 10*3/uL (ref 0.1–0.9)
NEUTROS ABS: 4.2 10*3/uL (ref 1.4–7.0)
Neutrophils: 58 %
Platelets: 282 10*3/uL (ref 150–379)
RBC: 4.98 x10E6/uL (ref 3.77–5.28)
RDW: 14.3 % (ref 12.3–15.4)
WBC: 7.2 10*3/uL (ref 3.4–10.8)

## 2017-03-19 LAB — TSH: TSH: 2.93 u[IU]/mL (ref 0.450–4.500)

## 2017-04-14 ENCOUNTER — Encounter: Payer: Self-pay | Admitting: Nurse Practitioner

## 2017-04-14 ENCOUNTER — Ambulatory Visit (INDEPENDENT_AMBULATORY_CARE_PROVIDER_SITE_OTHER): Payer: Self-pay | Admitting: Nurse Practitioner

## 2017-04-14 VITALS — BP 110/70 | Ht 68.0 in | Wt 191.0 lb

## 2017-04-14 DIAGNOSIS — E669 Obesity, unspecified: Secondary | ICD-10-CM

## 2017-04-14 MED ORDER — PHENTERMINE HCL 37.5 MG PO TABS: 37.5000 mg | ORAL_TABLET | Freq: Every day | ORAL | 2 refills | Status: DC

## 2017-04-14 NOTE — Progress Notes (Signed)
Subjective:  Presents for recheck on her weight. Doing very well on phentermine. Has cut out her sweet tea and drinking more water. Continues to be active especially at work. Denies any adverse effects from the phentermine. Sleeping well.  Objective:   BP 110/70   Ht  (1.727 m)   Wt 191 lb (86.6 kg)   BMI 29.04 kg/m  NAD. Alert, oriented. Lungs clear. Heart regular rate rhythm.  Assessment:  Obesity, mild    Plan:   Meds ordered this encounter  Medications  . phentermine (ADIPEX-P) 37.5 MG tablet    Sig: Take 1 tablet (37.5 mg total) by mouth daily before breakfast.    Dispense:  30 tablet    Refill:  2    Order Specific Question:   Supervising Provider    Answer:   Merlyn Albert [[id number]]   Continue to develop healthy habits including healthy diet and regular activity. Recheck in 3 months if she wishes to continue weight loss medication. Call back sooner if any problems.

## 2017-12-29 ENCOUNTER — Encounter: Payer: Self-pay | Admitting: Nurse Practitioner

## 2017-12-29 ENCOUNTER — Ambulatory Visit: Payer: Self-pay | Admitting: Nurse Practitioner

## 2017-12-29 VITALS — BP 122/80 | Ht 68.0 in | Wt 187.0 lb

## 2017-12-29 DIAGNOSIS — R350 Frequency of micturition: Secondary | ICD-10-CM

## 2017-12-29 DIAGNOSIS — F988 Other specified behavioral and emotional disorders with onset usually occurring in childhood and adolescence: Secondary | ICD-10-CM

## 2017-12-29 MED ORDER — AMPHETAMINE-DEXTROAMPHETAMINE 20 MG PO TABS: ORAL_TABLET | ORAL | 0 refills | Status: DC

## 2017-12-29 NOTE — Progress Notes (Signed)
Subjective: Presents to discuss restarting her ADD medication that she has been on intermittently for years.  Was last on Adderall 20 mg twice daily per Dr. Tenny Crawoss in 2017.  Needs medication since she will be going back to school this fall. Has taken medication without difficulty in the past. Also c/o urinary frequency and urgency with pressure that occurred 3 days ago. Started AZO and cranberry juice. Symptoms have resolved. No fever. No pelvic or back pain. No vaginal discharge. No new sexual partners. No history of recent UTI. No dysuria.   Objective:   BP 122/80   Ht 5\' 8"  (1.727 m)   Wt 187 lb 0.4 oz (84.8 kg)   BMI 28.44 kg/m  NAD. Alert, oriented. Lungs clear. Heart RRR. Abdomen soft, non tender. No CVA tenderness. UA negative.     Assessment:   Problem List Items Addressed This Visit      Other   ADD (attention deficit disorder) - Primary    Other Visit Diagnoses    Urinary frequency           Plan:   Meds ordered this encounter  Medications  . DISCONTD: amphetamine-dextroamphetamine (ADDERALL) 20 MG tablet    Sig: Take one po qam then one four hours later    Dispense:  60 tablet    Refill:  0    Order Specific Question:   Supervising Provider    Answer:   Merlyn AlbertLUKING, WILLIAM S [[id number]]  . DISCONTD: amphetamine-dextroamphetamine (ADDERALL) 20 MG tablet    Sig: Take one po qam then one four hours later    Dispense:  60 tablet    Refill:  0    Order Specific Question:   Supervising Provider    Answer:   Merlyn AlbertLUKING, WILLIAM S [[id number]]  . DISCONTD: amphetamine-dextroamphetamine (ADDERALL) 20 MG tablet    Sig: Take one po qam then one four hours later    Dispense:  60 tablet    Refill:  0    May fill  30 days from 12/29/17    Order Specific Question:   Supervising Provider    Answer:   Merlyn AlbertLUKING, WILLIAM S [[id number]]  . amphetamine-dextroamphetamine (ADDERALL) 20 MG tablet    Sig: Take one po qam then one four hours later    Dispense:  60 tablet    Refill:  0    May fill  60 days from  12/29/17    Order Specific Question:   Supervising Provider    Answer:   Merlyn AlbertLUKING, WILLIAM S [[id number]]   Restart Adderall at previous dose. Use Goodrx app to get discount. Cancelled first order then printed them. No antibiotics indicated at this time. Call back if urinary symptoms recur.  Return in about 4 months (around 05/01/2018).

## 2017-12-29 NOTE — Patient Instructions (Signed)
Take one tab twice day about 4 hours apart.  Take one whole tab in the am then 1/2 tab every 4 hours x 2

## 2017-12-31 ENCOUNTER — Telehealth: Payer: Self-pay | Admitting: Nurse Practitioner

## 2017-12-31 NOTE — Telephone Encounter (Signed)
I sent the first one by accident then cancelled. Gave written Rx since she has to pay out of pocket. Thx

## 2017-12-31 NOTE — Telephone Encounter (Signed)
Contacted [facility] and informed them that the first one was sent in by accident. Tammy stated they stapled one of the written ones to the electronic one (they did not get a cancellation on this) and let that be one script. (3 scripts total)

## 2017-12-31 NOTE — Telephone Encounter (Signed)
Mardelle MatteAndy from [facility] called and stated the he has received an e script and has received printed scripts for patients Adderall 20mg . Need clarification on what pharmacy is to honor. The printed or the e script. Please advise. Thanks

## 2018-01-06 ENCOUNTER — Other Ambulatory Visit: Payer: Self-pay | Admitting: Nurse Practitioner

## 2018-02-18 ENCOUNTER — Other Ambulatory Visit: Payer: Self-pay | Admitting: *Deleted

## 2018-02-20 ENCOUNTER — Other Ambulatory Visit: Payer: Self-pay | Admitting: Family Medicine

## 2018-02-23 NOTE — Telephone Encounter (Signed)
May have this +1 refill 

## 2018-02-24 MED ORDER — PHENTERMINE HCL 37.5 MG PO TABS: 37.5000 mg | ORAL_TABLET | Freq: Every day | ORAL | 1 refills | Status: DC

## 2018-03-23 ENCOUNTER — Telehealth: Payer: Self-pay | Admitting: Family Medicine

## 2018-03-23 NOTE — Telephone Encounter (Signed)
Pt mom states that sometimes it is heavy. Had period approx around first of month. Then the bleeding started back off and on. Pt mom states daughter has complaints about being uncomfortable. 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  Please advise. Thanks.

## 2018-03-23 NOTE — Telephone Encounter (Signed)
Pt having vag bleeding off and on couple weeks,  Pt's mother calling in regards of pt requesting med to ease her bleeding when it is present. Please advise and inform of further.

## 2018-03-24 NOTE — Telephone Encounter (Signed)
Spoke with patient. Pt states she does still have the implant. Pt advised to get follow up visit with gynecology. Pt verbalized understanding.

## 2018-03-24 NOTE — Telephone Encounter (Signed)
Mom called back to office to see if something could be done before Oct 2. Pt has appt with Gyn on Oct 2. Upon speaking with Dr.Scott, patient can see NP for vaginal bleeding or patient could call gyn to see if they could send something in for her until her appt. Pt mom transferred up front to get appt with NP.

## 2018-03-24 NOTE — Telephone Encounter (Signed)
Patient had previous implant for birth control through gynecology.  Does she still have this currently? (If she no longer has the implant then the following-at this point I recommend that the patient decide whether she would want to come in and be seen by Lillia AbedLindsay or set up a follow-up office visit with [facility] at [facility])  This type of issue needs further evaluation and it is not wise to just prescribed a medication over the phone without further evaluation either by us or gynecology

## 2018-03-25 ENCOUNTER — Other Ambulatory Visit: Payer: Self-pay | Admitting: Advanced Practice Midwife

## 2018-03-25 ENCOUNTER — Telehealth: Payer: Self-pay | Admitting: Advanced Practice Midwife

## 2018-03-25 ENCOUNTER — Ambulatory Visit (INDEPENDENT_AMBULATORY_CARE_PROVIDER_SITE_OTHER): Payer: Medicaid Other | Admitting: Family Medicine

## 2018-03-25 ENCOUNTER — Encounter: Payer: Self-pay | Admitting: Family Medicine

## 2018-03-25 VITALS — BP 118/78 | Ht 68.0 in | Wt 178.0 lb

## 2018-03-25 DIAGNOSIS — N926 Irregular menstruation, unspecified: Secondary | ICD-10-CM

## 2018-03-25 DIAGNOSIS — R5383 Other fatigue: Secondary | ICD-10-CM

## 2018-03-25 LAB — POCT HEMOGLOBIN: Hemoglobin: 13.5 g/dL (ref 12.2–16.2)

## 2018-03-25 LAB — POCT URINE PREGNANCY: PREG TEST UR: NEGATIVE

## 2018-03-25 MED ORDER — MEGESTROL ACETATE 40 MG PO TABS: ORAL_TABLET | ORAL | 3 refills | Status: DC

## 2018-03-25 NOTE — Addendum Note (Signed)
Addended by: Margaretha Sheffield on: 03/25/2018 12:24 PM   Modules accepted: Orders

## 2018-03-25 NOTE — Progress Notes (Addendum)
   Subjective:    Patient ID: Tracy Flores, female    DOB: 09/05/1990, 27 y.o.   MRN: [id number]  HPI  Patient is here today with complaints of have her cycle for the last three weeks straight. She states it makes her feel tired and weak. She [facility] and they could not see her until Oct 5,2019 and the pt did not want to wait that long to be seen.  Reports has had current nexplanon x 2 years, reports having bleeding usually monthly or every other month for about 2-3 days. Reports bleeding daily for the last 3 weeks, using pads, changes about 5-6 times per day, but are not completely soaked through. Denies any clots. Reports varies from dark red to bright red bleeding. Reports some mild cramping, but state no more than usual. States she feels more irritable and tired.  Reports currently sexually active, no change in partners, denies concern for STDs, denies vaginal discharge, itching, or burning. States partner has had vasectomy.  Review of Systems  Genitourinary: Positive for menstrual problem and vaginal bleeding. Negative for pelvic pain, vaginal discharge and vaginal pain.   Results for orders placed or performed in visit on 03/25/18  POCT hemoglobin  Result Value Ref Range   Hemoglobin 13.5 12.2 - 16.2 g/dL  POCT urine pregnancy  Result Value Ref Range   Preg Test, Ur Negative Negative       Objective:   Physical Exam  Constitutional: She is oriented to person, place, and time. She appears well-developed and well-nourished. No distress.  HENT:  Head: Normocephalic and atraumatic.  Eyes: Right eye exhibits no discharge. Left eye exhibits no discharge.  Neck: Neck supple. No thyromegaly present.  Cardiovascular: Normal rate, regular rhythm and normal heart sounds.  No murmur heard. Pulmonary/Chest: Effort normal and breath sounds normal. No respiratory distress.  Abdominal: Soft. Bowel sounds are normal. She exhibits no distension and no mass. There  is no tenderness.  Lymphadenopathy:    She has no cervical adenopathy.  Neurological: She is alert and oriented to person, place, and time.  Skin: Skin is warm and dry.  Nexplanon implant palpated to left upper arm.  Psychiatric: She has a normal mood and affect.  Nursing note and vitals reviewed.      Assessment & Plan:  1. Abnormal menses - Plan: POCT hemoglobin Urine pregnancy test today was negative. Patient has been seeing [facility], [place] for [place]. We will place a call in to her on next steps for this patient and f/u with patient based on their recommendations. Her hemoglobin is stable and she is in no acute distress at this time. Conferred with Dr. Lorin PicketScott on this plan.

## 2018-03-25 NOTE — Telephone Encounter (Signed)
Spoke with pt. Pt has Nexplanon. Has been bleeding x 3 weeks. Sometimes heavy, sometimes light. Can you order med? Thanks!! JSY

## 2018-03-25 NOTE — Telephone Encounter (Signed)
Patient called stating that she has been spotting for a couple of weeks and it is bothering her, pt states that she has seen Dr. Fletcher Anon office but they are not able to help her. Pt would like to know if we could call her something in. Please contact pt

## 2018-03-25 NOTE — Progress Notes (Signed)
Megace for BTB on nexplanon 

## 2018-04-28 ENCOUNTER — Telehealth: Payer: Self-pay | Admitting: Family Medicine

## 2018-04-28 NOTE — Telephone Encounter (Signed)
Patient needs refill for: amphetamine-dextroamphetamine (ADDERALL) 20 MG tablet    [facility] - [place], [place] - [address]  nxt appt: 05/25/18 for med check   Best time to call between 10am-11am today

## 2018-04-30 ENCOUNTER — Other Ambulatory Visit: Payer: Self-pay | Admitting: Family Medicine

## 2018-04-30 MED ORDER — AMPHETAMINE-DEXTROAMPHETAMINE 20 MG PO TABS: ORAL_TABLET | ORAL | 0 refills | Status: DC

## 2018-04-30 NOTE — Telephone Encounter (Signed)
Prescription was printed patient to keep her follow-up visit

## 2018-04-30 NOTE — Telephone Encounter (Signed)
Pt checking on status of refill request. She is out of the medication.

## 2018-04-30 NOTE — Telephone Encounter (Signed)
Prescription picked up. 

## 2018-05-25 ENCOUNTER — Ambulatory Visit (INDEPENDENT_AMBULATORY_CARE_PROVIDER_SITE_OTHER): Payer: Self-pay | Admitting: Family Medicine

## 2018-05-25 ENCOUNTER — Ambulatory Visit: Payer: Medicaid Other | Admitting: Family Medicine

## 2018-05-25 ENCOUNTER — Encounter: Payer: Self-pay | Admitting: Family Medicine

## 2018-05-25 VITALS — BP 120/72 | Ht 68.0 in | Wt 182.0 lb

## 2018-05-25 DIAGNOSIS — F320 Major depressive disorder, single episode, mild: Secondary | ICD-10-CM

## 2018-05-25 DIAGNOSIS — F988 Other specified behavioral and emotional disorders with onset usually occurring in childhood and adolescence: Secondary | ICD-10-CM

## 2018-05-25 DIAGNOSIS — F419 Anxiety disorder, unspecified: Secondary | ICD-10-CM

## 2018-05-25 MED ORDER — AMPHETAMINE-DEXTROAMPHETAMINE 20 MG PO TABS: ORAL_TABLET | ORAL | 0 refills | Status: DC

## 2018-05-25 MED ORDER — AMPHETAMINE-DEXTROAMPHETAMINE 20 MG PO TABS
ORAL_TABLET | ORAL | 0 refills | Status: DC
Start: 2018-05-30 — End: 2018-08-31

## 2018-05-25 NOTE — Progress Notes (Signed)
Subjective:    Patient ID: Tracy Flores, female    DOB: 08/26/1990, 27 y.o.   MRN: [id number]  HPI Patient arrives for a follow up on ADHD. Patient currently on Adderall 20 mg one qam and ont 4hrs later. Patient reports she is doing well on current dose.  Taking 1 tablet at 8 am, then 1/2 tablet at noon, and 1/2 tablet at 4 pm. Helpful for concentrating and getting school work done. Denies any effect on appetite or sleep. Getting about 6-7 hours of sleep per night. 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  Patient states she took the GAD 7 at her last visit and scored high and was told that she could discuss a medication for that at this follow up visit. Patient states she is unable to go to psychiatry at this time sue to lack of insurance.  States a few times a week feels like thoughts come rushing in and feels overwhelmed. Reports stressors with school and work, also her child hasn't adjusted to her new schedule. Denies SI/HI. Reports was seen by psychiatry previously - last seen about 2 years ago and patient came off medication at that time.  Reports trying a multitude of different SSRIs, without effect.  Coping mechanisms: deep breathing/holding breath, alone time  Review of Systems  Constitutional: Negative for activity change, appetite change and unexpected weight change.  Respiratory: Negative for shortness of breath.   Cardiovascular: Negative for chest pain.  Psychiatric/Behavioral: Negative for sleep disturbance and suicidal ideas.       Objective:   Physical Exam  Constitutional: She is oriented to person, place, and time. She appears well-developed and well-nourished. No distress.  HENT:  Head: Normocephalic and atraumatic.  Neck: Neck supple.  Cardiovascular: Normal rate, regular rhythm and normal heart sounds.  No murmur heard. Pulmonary/Chest: Effort normal and breath sounds normal. No respiratory distress.    Neurological: She is alert and oriented to person, place, and time.  Skin: Skin is warm and dry.  Psychiatric: She has a normal mood and affect.  Nursing note and vitals reviewed.  Dep[facility] screen [facility] 05/25/2018 12/29/2017 03/17/2017  Decreased Interest 1 1 1   Down, Depressed, Hopeless 1 1 0  PHQ - 2 Score 2 2 1   Altered sleeping 0 2 0  Tired, decreased energy 2 2 3   Change in appetite 0 2 1  Feeling bad or failure about yourself  1 0 0  Trouble concentrating 0 2 1  Moving slowly or fidgety/restless 2 1 0  Suicidal thoughts 0 0 0  PHQ-9 Score 7 11 6   Difficult doing work/chores Somewhat difficult Somewhat difficult Somewhat difficult   GAD 7 : Generalized Anxiety Score 05/25/2018 12/29/2017  Nervous, Anxious, on Edge 2 2  Control/stop worrying 3 2  Worry too much - different things 1 3  Trouble relaxing 2 3  Restless 2 3  Easily annoyed or irritable 3 3  Afraid - awful might happen 1 3  Total GAD 7 Score 14 19  Anxiety Difficulty Somewhat difficult Somewhat difficult       Assessment & Plan:  1. Attention deficit disorder (ADD) without hyperactivity The patient was seen today as part of the visit regarding ADD. Medications were reviewed with the patient as well as compliance. Side effects were checked for. Discussion regarding effectiveness was held. Prescriptions were written. Patient reminded to follow-up in approximately 3 months. Behavioral and study issues were addressed.  Complied with [facility] law, drug registry was checked and verified while present with the patient.  2. Depression, major, single episode, mild (HCC) Discussed with patient the option of starting on an SSRI to help treat both her depression and anxiety symptoms.  Patient reports she is tried multiple SSRIs without improvement and was followed by psychiatry in the past.  She is not interested in starting a SSRI today.  Discussed reservations with starting on a benzodiazepine for anxiety, and  recommended against this.  Offered counseling services, and patient refused at this time.  Recommend relaxation techniques.  Instructed patient to give [place] a follow-up via MyChart over the next few weeks to see how she is doing.  If she still having significant symptoms may consider starting on BuSpar.  She is not suicidal.  Discussed if she develops suicidal ideation she should seek care emergently.  Patient verbalized understanding.  3. Anxiety See #2 plan  Dr. Lilyan Punt was consulted on this case and is in agreement with the above treatment plan.

## 2018-06-04 ENCOUNTER — Encounter: Payer: Self-pay | Admitting: Family Medicine

## 2018-06-04 ENCOUNTER — Ambulatory Visit (INDEPENDENT_AMBULATORY_CARE_PROVIDER_SITE_OTHER): Payer: Self-pay | Admitting: Family Medicine

## 2018-06-04 VITALS — BP 118/70 | Ht 68.0 in | Wt 178.0 lb

## 2018-06-04 DIAGNOSIS — F411 Generalized anxiety disorder: Secondary | ICD-10-CM

## 2018-06-04 MED ORDER — FLUOXETINE HCL 10 MG PO CAPS: 10.0000 mg | ORAL_CAPSULE | Freq: Every day | ORAL | 3 refills | Status: DC

## 2018-06-04 MED ORDER — BUSPIRONE HCL 5 MG PO TABS: 5.0000 mg | ORAL_TABLET | Freq: Three times a day (TID) | ORAL | 2 refills | Status: DC

## 2018-06-04 NOTE — Progress Notes (Signed)
   Subjective:    Patient ID: Tracy Flores, female    DOB: 11/23/1990, 27 y.o.   MRN: [id number]  Anxiety  Presents for follow-up visit. Symptoms include depressed mood, excessive worry and nervous/anxious behavior. Patient reports no chest pain or suicidal ideas.    pt states she was put on adderall and carolyn wanted to put her on anxiety meds also but did not want to start two new meds at one time. Pt states she came back to see Lillia AbedLindsay who did not want to put her on any anxiety meds. She states she is self pay and cannot afford to see specialist at this time.  This patient does have a lot of anxiety and a lot of stress she does the best she can she is going to school she has a young child she does have some family support.  She denies being suicidal.  Does have some depression symptoms  States she cannot afford to go to any counseling currently we did talk at length about relaxation techniques and gave her some information regarding this in addition to this we will go ahead with medication I did talk with her about medicines what he can and cannot do and she is interested in trying   Review of Systems  Constitutional: Negative for activity change, appetite change and fatigue.  HENT: Negative for congestion.   Respiratory: Negative for cough.   Cardiovascular: Negative for chest pain.  Gastrointestinal: Negative for abdominal pain.  Skin: Negative for color change.  Neurological: Negative for headaches.  Psychiatric/Behavioral: Negative for behavioral problems and suicidal ideas. The patient is nervous/anxious.        Objective:   Physical Exam  Constitutional: She appears well-nourished. No distress.  HENT:  Head: Normocephalic.  Cardiovascular: Normal rate, regular rhythm and normal heart sounds.  No murmur heard. Pulmonary/Chest: Effort normal and breath sounds normal.  Musculoskeletal: She exhibits no edema.  Lymphadenopathy:    She has no cervical adenopathy.    Neurological: She is alert.  Psychiatric: Her behavior is normal.  Vitals reviewed.         Assessment & Plan:  Generalized anxiety disorder along with panic attacks and some elements of depression not suicidal Start Prozac 10 mg daily patient was told that if it does cause suicidal ideation or other problems immediately stop the medicine she states she is interested in trying states she is used it before and it did seem to help  She is also interested in trying BuSpar we will start off with 5 mg 3 times daily she will follow-up with us in 6 weeks she will send us a MyChart update within the next 2 to 3 weeks and she knows that if she starts feeling worse she is to immediately stop medication and come back in and be checked

## 2018-07-07 ENCOUNTER — Ambulatory Visit (INDEPENDENT_AMBULATORY_CARE_PROVIDER_SITE_OTHER): Payer: Self-pay | Admitting: Family Medicine

## 2018-07-07 ENCOUNTER — Encounter: Payer: Self-pay | Admitting: Family Medicine

## 2018-07-07 VITALS — BP 110/68 | Temp 98.6°F | Ht 68.0 in | Wt 174.0 lb

## 2018-07-07 DIAGNOSIS — J019 Acute sinusitis, unspecified: Secondary | ICD-10-CM

## 2018-07-07 MED ORDER — DOXYCYCLINE HYCLATE 100 MG PO TABS: 100.0000 mg | ORAL_TABLET | Freq: Two times a day (BID) | ORAL | 0 refills | Status: DC

## 2018-07-07 NOTE — Progress Notes (Signed)
   Subjective:    Patient ID: Tracy Flores, female    DOB: 05-31-1991, 28 y.o.   MRN: [id number]  Sinusitis  This is a new problem. Associated symptoms include congestion, coughing, ear pain (pressure), headaches, sinus pressure and a sore throat. Pertinent negatives include no shortness of breath. (Diarrhea, pressure behind eyes) Treatments tried: went to urgent care on dec 26th. prescribed zpack.   2 weeks ago started with rhinorrhea and cough, no fever. Symptoms progressed and went to urgent care on 12/26 and was treated with z-pack. Finished this and symptoms had somewhat improved except for cough, which was still productive. Last two days having increased pressure behind eyes, hurts to front of head when she moves her eyes, also having ache in shoulders. Cough productive of green mucous still. Reports continued congestion with green discharge. No fevers. Occasional nausea, no vomiting, some upset stomach.    Review of Systems  Constitutional: Negative for fever.  HENT: Positive for congestion, ear pain (pressure), postnasal drip, sinus pressure and sore throat.   Respiratory: Positive for cough. Negative for shortness of breath and wheezing.   Neurological: Positive for headaches.       Objective:   Physical Exam Vitals signs and nursing note reviewed.  Constitutional:      General: She is not in acute distress.    Appearance: Normal appearance. She is not toxic-appearing.  HENT:     Head: Normocephalic and atraumatic.     Right Ear: Tympanic membrane is retracted.     Left Ear: Tympanic membrane is retracted.     Nose: Congestion present. No nasal tenderness.     Mouth/Throat:     Mouth: Mucous membranes are moist.     Pharynx: Oropharynx is clear.  Eyes:     General:        Right eye: No discharge.        Left eye: No discharge.     Extraocular Movements: Extraocular movements intact.     Pupils: Pupils are equal, round, and reactive to light.  Neck:   Musculoskeletal: Neck supple. No neck rigidity.  Cardiovascular:     Rate and Rhythm: Normal rate and regular rhythm.     Heart sounds: Normal heart sounds.  Pulmonary:     Effort: Pulmonary effort is normal. No respiratory distress.     Breath sounds: Normal breath sounds.  Lymphadenopathy:     Cervical: No cervical adenopathy.  Skin:    General: Skin is warm and dry.  Neurological:     Mental Status: She is alert and oriented to person, place, and time.  Psychiatric:        Mood and Affect: Mood normal.           Assessment & Plan:  Acute rhinosinusitis  Discussed likely sinusitis not responsive to z-pack treatment, recommend treating with doxycycline x 10 days. Symptomatic care discussed. Warning signs discussed. She should f/u if symptoms worsen or fail to improve over the next several days.

## 2018-07-10 ENCOUNTER — Ambulatory Visit (INDEPENDENT_AMBULATORY_CARE_PROVIDER_SITE_OTHER): Payer: Self-pay | Admitting: Family Medicine

## 2018-07-10 ENCOUNTER — Encounter: Payer: Self-pay | Admitting: Family Medicine

## 2018-07-10 ENCOUNTER — Telehealth: Payer: Self-pay | Admitting: Family Medicine

## 2018-07-10 VITALS — BP 112/72 | Temp 97.5°F | Ht 68.0 in | Wt 171.0 lb

## 2018-07-10 DIAGNOSIS — J019 Acute sinusitis, unspecified: Secondary | ICD-10-CM

## 2018-07-10 DIAGNOSIS — F411 Generalized anxiety disorder: Secondary | ICD-10-CM

## 2018-07-10 MED ORDER — FLUOXETINE HCL 20 MG PO CAPS: 20.0000 mg | ORAL_CAPSULE | Freq: Every day | ORAL | 3 refills | Status: DC

## 2018-07-10 MED ORDER — LEVOFLOXACIN 500 MG PO TABS: 500.0000 mg | ORAL_TABLET | Freq: Every day | ORAL | 0 refills | Status: DC

## 2018-07-10 NOTE — Progress Notes (Signed)
   Subjective:    Patient ID: Tracy Flores, female    DOB: 28-Sep-1990, 28 y.o.   MRN: [id number]  HPI  Patient arrives with progression of body aches. Patient was seen earlier in the week and diagnosed with sinus infection and given doxycycline. Patient states the doxycycline has made her sick even if she eats when she takes the med. Patient states the body aches have gotten worse and she has little energy. Review of Systems  Constitutional: Negative for activity change and fever.  HENT: Positive for congestion and rhinorrhea. Negative for ear pain.   Eyes: Negative for discharge.  Respiratory: Positive for cough. Negative for shortness of breath and wheezing.   Cardiovascular: Negative for chest pain.       Objective:   Physical Exam Vitals signs and nursing note reviewed.  Constitutional:      Appearance: She is well-developed.  HENT:     Head: Normocephalic.     Nose: Nose normal.     Mouth/Throat:     Pharynx: No oropharyngeal exudate.  Neck:     Musculoskeletal: Neck supple.  Cardiovascular:     Rate and Rhythm: Normal rate.     Heart sounds: Normal heart sounds. No murmur.  Pulmonary:     Effort: Pulmonary effort is normal.     Breath sounds: Normal breath sounds. No wheezing.  Lymphadenopathy:     Cervical: No cervical adenopathy.  Skin:    General: Skin is warm and dry.   Neck is supple mild sinus tenderness patient not toxic no respiratory difficulty no crackles        Assessment & Plan:  I talked at length about stress with her she states overall she feels she is handling things fairly well she denies any depression currently in her PHQ 9 and GAD 7 look better we did talk about stress and how it can cause snappiness and irritability she admits to this and states that she thinks she would do better on a slightly higher dose of her Prozac we went ahead and increase the dose of this I cautioned her that if she starts feeling depressed or worse to follow-up  otherwise she needs to be rechecked again in approximately 6 to 8 weeks  Acute rhinosinusitis no sign of meningitis recommend switching antibiotics warning signs discussed follow-up if ongoing troubles

## 2018-07-10 NOTE — Telephone Encounter (Signed)
Pt's mom wants to make Dr. Lorin Picket aware the medication he put her on is not working well for her. She is very agitated and anxious and quick tempered. Vilma does not want Aariyah knowing she is called in about her but she states Baillie will not tell Dr. Lorin Picket that and she just wants to make sure Kolbi and her grandson's are safe.

## 2018-07-10 NOTE — Telephone Encounter (Signed)
Patient has office visit today at 1:10pm with Dr Lorin Picket- the message is Lorain Childes and grandmother does not want patietn to know she called in with this information but wants the doctor to know

## 2018-07-17 ENCOUNTER — Ambulatory Visit: Payer: Medicaid Other | Admitting: Family Medicine

## 2018-08-31 ENCOUNTER — Encounter: Payer: Self-pay | Admitting: Family Medicine

## 2018-08-31 ENCOUNTER — Ambulatory Visit (INDEPENDENT_AMBULATORY_CARE_PROVIDER_SITE_OTHER): Payer: Self-pay | Admitting: Family Medicine

## 2018-08-31 VITALS — BP 118/76 | Wt 167.8 lb

## 2018-08-31 DIAGNOSIS — F988 Other specified behavioral and emotional disorders with onset usually occurring in childhood and adolescence: Secondary | ICD-10-CM

## 2018-08-31 DIAGNOSIS — F411 Generalized anxiety disorder: Secondary | ICD-10-CM

## 2018-08-31 MED ORDER — FLUOXETINE HCL 20 MG PO CAPS: 20.0000 mg | ORAL_CAPSULE | Freq: Every day | ORAL | 3 refills | Status: DC

## 2018-08-31 MED ORDER — AMPHETAMINE-DEXTROAMPHETAMINE 20 MG PO TABS: ORAL_TABLET | ORAL | 0 refills | Status: DC

## 2018-08-31 NOTE — Progress Notes (Signed)
   Subjective:    Patient ID: Tracy Flores, female    DOB: 1991/03/25, 28 y.o.   MRN: [id number]  HPI Patient was seen today for ADD checkup.  This patient does have ADD.  Patient takes medications for this.  If this does help control overall symptoms.  Please see below. -weight, vital signs reviewed.  The following items were covered. -Compliance with medication : taking adderall 20 mg takes one in the morning and one half at noon and one half at 4 pm.   -Problems with completing homework, paying attention/taking good notes in school: none  -grades: good  - Eating patterns : eats well  -sleeping: sleeping well  -Additional issues or questions: pt would like Prozac refilled Patient relates her stress levels are still pretty high but anxiety has improved and she does not feel as stressed denies being depressed currently states medicine is helping she would like to continue it  Review of Systems  Constitutional: Negative for activity change, appetite change and fatigue.  HENT: Negative for congestion and rhinorrhea.   Respiratory: Negative for cough and shortness of breath.   Cardiovascular: Negative for chest pain and leg swelling.  Gastrointestinal: Negative for abdominal pain and diarrhea.  Skin: Negative for color change.  Neurological: Negative for headaches.  Psychiatric/Behavioral: Negative for behavioral problems.       Objective:   Physical Exam Vitals signs reviewed.  Constitutional:      General: She is not in acute distress. HENT:     Head: Normocephalic and atraumatic.  Eyes:     General:        Right eye: No discharge.        Left eye: No discharge.  Neck:     Trachea: No tracheal deviation.  Cardiovascular:     Rate and Rhythm: Normal rate and regular rhythm.     Heart sounds: Normal heart sounds. No murmur.  Pulmonary:     Effort: Pulmonary effort is normal. No respiratory distress.     Breath sounds: Normal breath sounds.  Lymphadenopathy:   Cervical: No cervical adenopathy.  Skin:    General: Skin is warm and dry.  Neurological:     Mental Status: She is alert.     Coordination: Coordination normal.  Psychiatric:        Behavior: Behavior normal.           Assessment & Plan:  Generalized anxiety disorder under a lot of stress managing it fairly well she will continue the medication she states Prozac is helping  Patient was told that if she starts having progressive troubles or feeling worse in regards anxiety or stress to follow-up  Adult ADD going to school medication benefits are she cannot afford the extended release therefore use short-term release she is to follow-up if progressive troubles or problems 3 prescriptions given recheck patient in 3 months

## 2018-09-29 ENCOUNTER — Other Ambulatory Visit: Payer: Self-pay | Admitting: Family Medicine

## 2018-11-20 ENCOUNTER — Encounter: Payer: Medicaid Other | Admitting: Family Medicine

## 2018-12-03 ENCOUNTER — Telehealth: Payer: Self-pay | Admitting: Family Medicine

## 2018-12-03 ENCOUNTER — Other Ambulatory Visit: Payer: Self-pay | Admitting: Family Medicine

## 2018-12-03 NOTE — Telephone Encounter (Signed)
I sent in a refill of patient's medication for ADD She will need to schedule virtual visit for june

## 2018-12-03 NOTE — Telephone Encounter (Signed)
I will send in one prescription patient needs to schedule virtual visit

## 2018-12-04 NOTE — Telephone Encounter (Signed)
[facility] - need to schedule virtual visit in June & notified that Dr. Lorin Picket sent in refill

## 2019-01-06 ENCOUNTER — Other Ambulatory Visit: Payer: Self-pay | Admitting: Family Medicine

## 2019-01-06 ENCOUNTER — Telehealth: Payer: Self-pay | Admitting: Family Medicine

## 2019-01-06 MED ORDER — AMPHETAMINE-DEXTROAMPHETAMINE 20 MG PO TABS: ORAL_TABLET | ORAL | 0 refills | Status: DC

## 2019-01-06 NOTE — Telephone Encounter (Signed)
Pt's mom called and scheduled a virtual visit for her to have a med check. Pt is scheduled for 7/27. She would like to know if Dr. Nicki Reaper will refill her amphetamine-dextroamphetamine (ADDERALL) 20 MG tablet   [place], [place] - [place]

## 2019-01-06 NOTE — Telephone Encounter (Signed)
Her medication was sent in keep follow-up appointment

## 2019-01-07 NOTE — Telephone Encounter (Signed)
Left message to return call 

## 2019-01-12 NOTE — Telephone Encounter (Signed)
Left message to return call 

## 2019-01-13 NOTE — Telephone Encounter (Signed)
Patient notified

## 2019-01-25 ENCOUNTER — Ambulatory Visit (INDEPENDENT_AMBULATORY_CARE_PROVIDER_SITE_OTHER): Payer: Medicaid Other | Admitting: Family Medicine

## 2019-01-25 ENCOUNTER — Other Ambulatory Visit: Payer: Self-pay

## 2019-01-25 ENCOUNTER — Encounter: Payer: Self-pay | Admitting: Family Medicine

## 2019-01-25 DIAGNOSIS — F988 Other specified behavioral and emotional disorders with onset usually occurring in childhood and adolescence: Secondary | ICD-10-CM

## 2019-01-25 MED ORDER — AMPHETAMINE-DEXTROAMPHETAMINE 20 MG PO TABS: ORAL_TABLET | ORAL | 0 refills | Status: DC

## 2019-01-25 NOTE — Progress Notes (Signed)
   Subjective:    Patient ID: Tracy Flores, female    DOB: December 12, 1990, 28 y.o.   MRN: [id number] Phone only video not possible HPI Patient was seen today for ADD checkup.  This patient does have ADD.  Patient takes medications for this.  If this does help control overall symptoms.  Please see below. -weight, vital signs reviewed.  The following items were covered. -Compliance with medication :adderal 20 mg one one each morning then 1/2 tablet 4 hours later  -Patient takes medication to help her focus at work she states is doing a good job overall she feels overall she is doing well  She states her moods are doing well she is no longer taking Prozac or BuSpar she feels like she is doing a good job keeping things under control she denies being depressed - Eating patterns : eats well  -sleeping: sleeping well  -Additional issues or questions: none   Virtual Visit via Video Note  I connected with Tracy Flores on 01/25/19 at  8:30 AM EDT by a video enabled telemedicine application and verified that I am speaking with the correct person using two identifiers.  Location: Patient: home Provider: provider   I discussed the limitations of evaluation and management by telemedicine and the availability of in person appointments. The patient expressed understanding and agreed to proceed.  History of Present Illness:    Observations/Objective:   Assessment and Plan:   Follow Up Instructions:    I discussed the assessment and treatment plan with the patient. The patient was provided an opportunity to ask questions and all were answered. The patient agreed with the plan and demonstrated an understanding of the instructions.   The patient was advised to call back or seek an in-person evaluation if the symptoms worsen or if the condition fails to improve as anticipated.  I provided 15 minutes of non-face-to-face time during this encounter.   Vicente Males, LPN   Review of  Systems  Constitutional: Negative for activity change, appetite change and fatigue.  HENT: Negative for congestion.   Respiratory: Negative for cough.   Cardiovascular: Negative for chest pain.  Gastrointestinal: Negative for abdominal pain.  Skin: Negative for color change.  Neurological: Negative for headaches.  Psychiatric/Behavioral: Negative for behavioral problems.       Objective:    Today's visit was via telephone Physical exam was not possible for this visit        Assessment & Plan:  The patient was seen today as part of the visit regarding ADD. Medications were reviewed with the patient as well as compliance. Side effects were checked for. Discussion regarding effectiveness was held. Prescriptions were written. Patient reminded to follow-up in approximately 3 months. Behavioral and study issues were addressed.  Plans to [facility] [place] law with drug registry was checked and verified while present with the patient.  She does state that she keeps up with her female health checkup

## 2019-02-10 ENCOUNTER — Other Ambulatory Visit: Payer: Self-pay

## 2019-02-10 ENCOUNTER — Ambulatory Visit (INDEPENDENT_AMBULATORY_CARE_PROVIDER_SITE_OTHER): Payer: Medicaid Other | Admitting: Family Medicine

## 2019-02-10 ENCOUNTER — Encounter: Payer: Self-pay | Admitting: Family Medicine

## 2019-02-10 DIAGNOSIS — R51 Headache: Secondary | ICD-10-CM

## 2019-02-10 DIAGNOSIS — R519 Headache, unspecified: Secondary | ICD-10-CM

## 2019-02-10 MED ORDER — NAPROXEN 500 MG PO TABS: 500.0000 mg | ORAL_TABLET | Freq: Two times a day (BID) | ORAL | 0 refills | Status: DC | PRN

## 2019-02-10 MED ORDER — ONDANSETRON 8 MG PO TBDP: 8.0000 mg | ORAL_TABLET | Freq: Three times a day (TID) | ORAL | 0 refills | Status: DC | PRN

## 2019-02-10 MED ORDER — TIZANIDINE HCL 4 MG PO TABS: 4.0000 mg | ORAL_TABLET | Freq: Three times a day (TID) | ORAL | 0 refills | Status: DC | PRN

## 2019-02-10 NOTE — Progress Notes (Signed)
   Subjective:    Patient ID: Tracy Flores, female    DOB: 10-27-1990, 28 y.o.   MRN: [id number]  HPI  Patient calls with migraine for 2 days. Patient states it hurts in her eyes and is making her nauseated.  Migraine related issues she states it started in temporal region bilateral.  Nonradiating to the back of the head into the neck and upper shoulder she feels like there is a tight grip on the back of her head she denies fever chills sweats she does relate nausea she has been under some stress.  She denies any vomiting no double vision.  No known injury.  Has not had anything like this recently.  No tick bites no rashes no COVID symptoms Virtual Visit via Video Note  I connected with Tracy Flores on 02/10/19 at  2:30 PM EDT by a video enabled telemedicine application and verified that I am speaking with the correct person using two identifiers.  Location: Patient: home Provider: office   I discussed the limitations of evaluation and management by telemedicine and the availability of in person appointments. The patient expressed understanding and agreed to proceed.  History of Present Illness:    Observations/Objective:   Assessment and Plan:   Follow Up Instructions:    I discussed the assessment and treatment plan with the patient. The patient was provided an opportunity to ask questions and all were answered. The patient agreed with the plan and demonstrated an understanding of the instructions.   The patient was advised to call back or seek an in-person evaluation if the symptoms worsen or if the condition fails to improve as anticipated.  I provided 15 minutes of non-face-to-face time during this encounter.       Review of Systems  Constitutional: Negative for activity change and appetite change.  HENT: Negative for congestion and rhinorrhea.   Respiratory: Negative for cough and shortness of breath.   Cardiovascular: Negative for chest pain and leg swelling.   Gastrointestinal: Negative for abdominal pain, nausea and vomiting.  Skin: Negative for color change.  Neurological: Positive for headaches. Negative for dizziness and weakness.  Psychiatric/Behavioral: Negative for agitation and confusion.       Objective:   Physical Exam   Patient had virtual visit Appears to be in no distress Atraumatic Neuro able to relate and oriented No apparent resp distress Color normal She does not appear to be in distress on visual exam     Assessment & Plan:  Probable tension headaches Recommend anti-inflammatory muscle relaxer.  Nausea medicine If not improving over the next 24 to 48 hours to notify us I do not feel the patient needs to go to the ER at this time I did warn the patient if she starts having frequent vomiting double vision or if it wakes her up in the middle night to go to ER also if high fever chills sweats to go to ER follow-up if progressive troubles or worse

## 2019-02-25 ENCOUNTER — Other Ambulatory Visit: Payer: Self-pay

## 2019-02-25 ENCOUNTER — Encounter: Payer: Self-pay | Admitting: Family Medicine

## 2019-02-25 ENCOUNTER — Ambulatory Visit (INDEPENDENT_AMBULATORY_CARE_PROVIDER_SITE_OTHER): Payer: Medicaid Other | Admitting: Family Medicine

## 2019-02-25 VITALS — BP 122/70 | Temp 98.1°F | Wt 165.6 lb

## 2019-02-25 DIAGNOSIS — R51 Headache: Secondary | ICD-10-CM | POA: Diagnosis not present

## 2019-02-25 DIAGNOSIS — M25512 Pain in left shoulder: Secondary | ICD-10-CM | POA: Diagnosis not present

## 2019-02-25 DIAGNOSIS — S29019A Strain of muscle and tendon of unspecified wall of thorax, initial encounter: Secondary | ICD-10-CM

## 2019-02-25 MED ORDER — CHLORZOXAZONE 500 MG PO TABS: ORAL_TABLET | ORAL | 0 refills | Status: DC

## 2019-02-25 MED ORDER — PREDNISONE 20 MG PO TABS: ORAL_TABLET | ORAL | 0 refills | Status: DC
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
## 2019-03-12 ENCOUNTER — Encounter: Payer: Self-pay | Admitting: Nurse Practitioner

## 2019-03-12 ENCOUNTER — Ambulatory Visit (INDEPENDENT_AMBULATORY_CARE_PROVIDER_SITE_OTHER): Payer: Medicaid Other | Admitting: Nurse Practitioner

## 2019-03-12 ENCOUNTER — Other Ambulatory Visit: Payer: Self-pay

## 2019-03-12 VITALS — BP 110/72 | Temp 97.7°F | Ht 68.0 in | Wt 165.0 lb

## 2019-03-12 DIAGNOSIS — M25512 Pain in left shoulder: Secondary | ICD-10-CM | POA: Diagnosis not present

## 2019-03-12 DIAGNOSIS — S29012D Strain of muscle and tendon of back wall of thorax, subsequent encounter: Secondary | ICD-10-CM

## 2019-03-12 MED ORDER — MELOXICAM 15 MG PO TABS: 15.0000 mg | ORAL_TABLET | Freq: Every day | ORAL | 0 refills | Status: DC

## 2019-03-12 MED ORDER — METHOCARBAMOL 500 MG PO TABS: 500.0000 mg | ORAL_TABLET | Freq: Three times a day (TID) | ORAL | 0 refills | Status: DC | PRN
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
## 2019-03-12 NOTE — Patient Instructions (Signed)
Icy Hot Smart Relief TENS unit Ice  Voltaren gel Lidocaine patch

## 2019-03-13 ENCOUNTER — Encounter: Payer: Self-pay | Admitting: Nurse Practitioner

## 2019-03-19 ENCOUNTER — Ambulatory Visit: Payer: Medicaid Other | Admitting: Nurse Practitioner

## 2019-04-23 ENCOUNTER — Ambulatory Visit (INDEPENDENT_AMBULATORY_CARE_PROVIDER_SITE_OTHER): Payer: Medicaid Other | Admitting: Nurse Practitioner

## 2019-04-23 ENCOUNTER — Other Ambulatory Visit: Payer: Self-pay

## 2019-04-23 ENCOUNTER — Encounter: Payer: Self-pay | Admitting: Nurse Practitioner

## 2019-04-23 VITALS — BP 120/72 | Temp 97.9°F | Ht 68.0 in | Wt 166.2 lb

## 2019-04-23 DIAGNOSIS — F411 Generalized anxiety disorder: Secondary | ICD-10-CM | POA: Diagnosis not present

## 2019-04-23 MED ORDER — CLONAZEPAM 0.5 MG PO TABS: ORAL_TABLET | ORAL | 0 refills | Status: DC

## 2019-04-23 MED ORDER — FLUOXETINE HCL 20 MG PO CAPS: 20.0000 mg | ORAL_CAPSULE | Freq: Every day | ORAL | 2 refills | Status: DC

## 2019-04-23 NOTE — Progress Notes (Signed)
Subjective:    Patient ID: Tracy Flores, female    DOB: 1991/06/27, 28 y.o.   MRN: [id number]  HPI  Patient would like to discuss referral for depression and anxiety. Patient has heard of clinics- one in [place] that do chemical imbalance testing and she would like to go to one of those clinics. Presents to discuss extreme anxiety symptoms.  Trouble sleeping off and on.  Normal appetite.  Weight stable.  Having some trouble remembering things.  Concentration has become an issue with increased anxiety even on her Adderall.  Has increased her smoking slightly, now 1 pack/day.  Denies any alcohol or drug use.  No current sexual partner.  Having occasional light vaginal bleeding.  Is due to have her Nexplanon replaced. Depression screen [facility] 2/9 04/23/2019 07/10/2018 06/04/2018 05/25/2018 12/29/2017  Decreased Interest 2 1 1 1 1   Down, Depressed, Hopeless 2 1 2 1 1   PHQ - 2 Score 4 2 3 2 2   Altered sleeping 2 0 1 0 2  Tired, decreased energy 2 1 2 2 2   Change in appetite 0 0 0 0 2  Feeling bad or failure about yourself  1 2 2 1  0  Trouble concentrating 1 0 2 0 2  Moving slowly or fidgety/restless 2 1 2 2 1   Suicidal thoughts 0 0 0 0 0  PHQ-9 Score 12 6 12 7 11   Difficult doing work/chores Somewhat difficult Somewhat difficult Somewhat difficult Somewhat difficult Somewhat difficult   GAD 7 : Generalized Anxiety Score 04/23/2019 07/10/2018 06/04/2018 05/25/2018  Nervous, Anxious, on Edge 3 1 3 2   Control/stop worrying 3 1 3 3   Worry too much - different things 3 0 3 1  Trouble relaxing 3 1 3 2   Restless 3 1 2 2   Easily annoyed or irritable 3 2 3 3   Afraid - awful might happen 1 1 3 1   Total GAD 7 Score 19 7 20 14   Anxiety Difficulty Somewhat difficult Somewhat difficult Very difficult Somewhat difficult      Review of Systems     Objective:   Physical Exam NAD.  Alert, oriented.  Mildly anxious affect.  Making good eye contact.  Thoughts logical coherent and relevant.  Lungs  clear.  Heart regular rate rhythm.      Assessment & Plan:   Problem List Items Addressed This Visit      Other   Generalized anxiety disorder - Primary   Relevant Medications   FLUoxetine (PROZAC) 20 MG capsule     Meds ordered this encounter  Medications  . FLUoxetine (PROZAC) 20 MG capsule    Sig: Take 1 capsule (20 mg total) by mouth daily.    Dispense:  30 capsule    Refill:  2    She has taken Prozac without difficulty in the past. Thanks.    Order Specific Question:   Supervising Provider    Answer:   A [[id number]]  . clonazePAM (KLONOPIN) 0.5 MG tablet    Sig: Take 1/2 - 1 tab po BID prn anxiety    Dispense:  30 tablet    Refill:  0    Order Specific Question:   Supervising Provider    Answer:   [phone]   Will refer to psychiatry at [facility] for evaluation and treatment. Patient understands this may take months for an appointment. Follow up here in the meantime. Our goal is to get her anxiety reduced so she can function  daily in the meantime. Patient understands that she should not take Klonopin if pregnant.  Return in about 1 month (around 05/24/2019) for virtual or phone if she prefers.

## 2019-04-30 ENCOUNTER — Other Ambulatory Visit: Payer: Self-pay

## 2019-04-30 ENCOUNTER — Encounter: Payer: Self-pay | Admitting: Family Medicine

## 2019-04-30 ENCOUNTER — Encounter: Payer: Self-pay | Admitting: Nurse Practitioner

## 2019-04-30 ENCOUNTER — Ambulatory Visit (INDEPENDENT_AMBULATORY_CARE_PROVIDER_SITE_OTHER): Payer: Medicaid Other | Admitting: Nurse Practitioner

## 2019-04-30 VITALS — BP 102/64 | Temp 97.7°F | Ht 68.0 in | Wt 165.0 lb

## 2019-04-30 DIAGNOSIS — G44219 Episodic tension-type headache, not intractable: Secondary | ICD-10-CM

## 2019-04-30 DIAGNOSIS — Z23 Encounter for immunization: Secondary | ICD-10-CM | POA: Diagnosis not present

## 2019-04-30 DIAGNOSIS — G43009 Migraine without aura, not intractable, without status migrainosus: Secondary | ICD-10-CM

## 2019-04-30 NOTE — Patient Instructions (Addendum)
-Can take anti-inflammatory medication (take with food) -Watch your diet (certain foods can trigger headaches) -Stretching exercises -Ice/Heat therapy -TENs unit  -Look into massage therapy -Look into chiropractor  -Lidocaine patches -Download migraine diary (many free apps ) Cervical Strain and Sprain Rehab Ask your health care provider which exercises are safe for you. Do exercises exactly as told by your health care provider and adjust them as directed. It is normal to feel mild stretching, pulling, tightness, or discomfort as you do these exercises. Stop right away if you feel sudden pain or your pain gets worse. Do not begin these exercises until told by your health care provider. Stretching and range-of-motion exercises Cervical side bending  1. Using good posture, sit on a stable chair or stand up. 2. Without moving your shoulders, slowly tilt your left / right ear to your shoulder until you feel a stretch in the opposite side neck muscles. You should be looking straight ahead. 3. Hold for __________ seconds. 4. Repeat with the other side of your neck. Repeat __________ times. Complete this exercise __________ times a day. Cervical rotation  1. Using good posture, sit on a stable chair or stand up. 2. Slowly turn your head to the side as if you are looking over your left / right shoulder. ? Keep your eyes level with the ground. ? Stop when you feel a stretch along the side and the back of your neck. 3. Hold for __________ seconds. 4. Repeat this by turning to your other side. Repeat __________ times. Complete this exercise __________ times a day. Thoracic extension and pectoral stretch 1. Roll a towel or a small blanket so it is about 4 inches (10 cm) in diameter. 2. Lie down on your back on a firm surface. 3. Put the towel lengthwise, under your spine in the middle of your back. It should not be under your shoulder blades. The towel should line up with your spine from your  middle back to your lower back. 4. Put your hands behind your head and let your elbows fall out to your sides. 5. Hold for __________ seconds. Repeat __________ times. Complete this exercise __________ times a day. Strengthening exercises Isometric upper cervical flexion 1. Lie on your back with a thin pillow behind your head and a small rolled-up towel under your neck. 2. Gently tuck your chin toward your chest and nod your head down to look toward your feet. Do not lift your head off the pillow. 3. Hold for __________ seconds. 4. Release the tension slowly. Relax your neck muscles completely before you repeat this exercise. Repeat __________ times. Complete this exercise __________ times a day. Isometric cervical extension  1. Stand about 6 inches (15 cm) away from a wall, with your back facing the wall. 2. Place a soft object, about 6-8 inches (15-20 cm) in diameter, between the back of your head and the wall. A soft object could be a small pillow, a ball, or a folded towel. 3. Gently tilt your head back and press into the soft object. Keep your jaw and forehead relaxed. 4. Hold for __________ seconds. 5. Release the tension slowly. Relax your neck muscles completely before you repeat this exercise. Repeat __________ times. Complete this exercise __________ times a day. Posture and body mechanics Body mechanics refers to the movements and positions of your body while you do your daily activities. Posture is part of body mechanics. Good posture and healthy body mechanics can help to relieve stress in your body's tissues  and joints. Good posture means that your spine is in its natural S-curve position (your spine is neutral), your shoulders are pulled back slightly, and your head is not tipped forward. The following are general guidelines for applying improved posture and body mechanics to your everyday activities. Sitting  1. When sitting, keep your spine neutral and keep your feet flat on  the floor. Use a footrest, if necessary, and keep your thighs parallel to the floor. Avoid rounding your shoulders, and avoid tilting your head forward. 2. When working at a desk or a computer, keep your desk at a height where your hands are slightly lower than your elbows. Slide your chair under your desk so you are close enough to maintain good posture. 3. When working at a computer, place your monitor at a height where you are looking straight ahead and you do not have to tilt your head forward or downward to look at the screen. Standing   When standing, keep your spine neutral and keep your feet about hip-width apart. Keep a slight bend in your knees. Your ears, shoulders, and hips should line up.  When you do a task in which you stand in one place for a long time, place one foot up on a stable object that is 2-4 inches (5-10 cm) high, such as a footstool. This helps keep your spine neutral. Resting When lying down and resting, avoid positions that are most painful for you. Try to support your neck in a neutral position. You can use a contour pillow or a small rolled-up towel. Your pillow should support your neck but not push on it. This information is not intended to replace advice given to you by your health care provider. Make sure you discuss any questions you have with your health care provider. Document Released: 06/17/2005 Document Revised: 10/07/2018 Document Reviewed: 03/18/2018 Elsevier Patient Education  2020 [employer].
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
## 2019-05-04 ENCOUNTER — Telehealth: Payer: Self-pay | Admitting: Family Medicine

## 2019-05-04 NOTE — Telephone Encounter (Signed)
Grandma said that Tracy Flores told her that Tracy Flores was going to call her in something for headaches when she was here last week but nothing was at the pharmacy.  I don't see anything in the chart.  [place]

## 2019-05-05 NOTE — Telephone Encounter (Signed)
She was given detailed information on how to help her headaches including continuing her medications. If she needs a refill on Mobic or Robaxin, I can send that in. She is also to keep a headache diary. Her diagnosis was Tension type headaches with muscle spasms leading into a migraine type headache. She does not need specific med for migraines at this time if we can get the trigger under control (tension headache and muscle spasms). I am not sure but Medicaid may pay for some chiropractic care for her neck. Thanks.

## 2019-05-05 NOTE — Telephone Encounter (Signed)
Left message to return call 

## 2019-05-06 NOTE — Telephone Encounter (Signed)
Contacted patient. Pt states no medications were at the pharmacy when she went to pick them up. Pt states she would like a refill on both Mobic and Robaxin. Please advise. Thank you  [place]

## 2019-05-07 ENCOUNTER — Telehealth: Payer: Self-pay | Admitting: *Deleted

## 2019-05-07 ENCOUNTER — Other Ambulatory Visit: Payer: Self-pay | Admitting: Nurse Practitioner

## 2019-05-07 MED ORDER — MELOXICAM 15 MG PO TABS: 15.0000 mg | ORAL_TABLET | Freq: Every day | ORAL | 0 refills | Status: DC

## 2019-05-07 MED ORDER — METHOCARBAMOL 500 MG PO TABS: 500.0000 mg | ORAL_TABLET | Freq: Three times a day (TID) | ORAL | 0 refills | Status: DC | PRN

## 2019-05-07 NOTE — Telephone Encounter (Signed)
Pt calling to check on referral

## 2019-05-10 ENCOUNTER — Encounter: Payer: Self-pay | Admitting: Family Medicine

## 2019-05-21 ENCOUNTER — Other Ambulatory Visit: Payer: Self-pay

## 2019-05-21 ENCOUNTER — Encounter: Payer: Medicaid Other | Admitting: Nurse Practitioner

## 2019-05-21 NOTE — Progress Notes (Signed)
   Subjective:    Patient ID: Tracy Flores, female    DOB: [birth year], 28 y.o.   MRN: [id number]  HPI    Review of Systems     Objective:   Physical Exam        Assessment & Plan:

## 2019-05-21 NOTE — Telephone Encounter (Signed)
[facility] psych declined to take pt, called & explained to pt & offered a referral to [facility] that she could go to [facility] in [place] or [facility] in [place] (no referral needed) Pt states she will look in to it & let me know

## 2019-05-26 NOTE — Progress Notes (Signed)
This encounter was created in error - please disregard.

## 2019-06-04 ENCOUNTER — Other Ambulatory Visit: Payer: Self-pay

## 2019-06-04 ENCOUNTER — Ambulatory Visit (INDEPENDENT_AMBULATORY_CARE_PROVIDER_SITE_OTHER): Payer: Medicaid Other | Admitting: Nurse Practitioner

## 2019-06-04 ENCOUNTER — Encounter: Payer: Self-pay | Admitting: Nurse Practitioner

## 2019-06-04 DIAGNOSIS — R0683 Snoring: Secondary | ICD-10-CM

## 2019-06-04 DIAGNOSIS — F411 Generalized anxiety disorder: Secondary | ICD-10-CM | POA: Diagnosis not present

## 2019-06-04 DIAGNOSIS — R5383 Other fatigue: Secondary | ICD-10-CM

## 2019-06-04 DIAGNOSIS — F988 Other specified behavioral and emotional disorders with onset usually occurring in childhood and adolescence: Secondary | ICD-10-CM

## 2019-06-04 MED ORDER — CLONAZEPAM 0.5 MG PO TABS: ORAL_TABLET | ORAL | 0 refills | Status: DC

## 2019-06-04 MED ORDER — AMPHETAMINE-DEXTROAMPHETAMINE 20 MG PO TABS: ORAL_TABLET | ORAL | 0 refills | Status: DC

## 2019-06-04 MED ORDER — FLUOXETINE HCL 40 MG PO CAPS: 40.0000 mg | ORAL_CAPSULE | Freq: Every day | ORAL | 1 refills | Status: DC

## 2019-06-04 NOTE — Progress Notes (Signed)
PHONE VISIT Subjective:    Patient ID: Tracy Flores, female    DOB: 1990-10-14, 28 y.o.   MRN: [id number]  HPI  Patient calls to get refills on her medications until she can get in with psychiatry.  Patient also wanted to discuss her sleep.  Virtual Visit via Video Note  I connected with Tracy Flores on 06/04/19 at 11:00 AM EST by a video enabled telemedicine application and verified that I am speaking with the correct person using two identifiers. Attempted video visit but could not get sound. Switched to a phone visit.   Location: Patient: home Provider: office   I discussed the limitations of evaluation and management by telemedicine and the availability of in person appointments. The patient expressed understanding and agreed to proceed.  History of Present Illness: Patient reports she has been feeling very anxious lately with the anxiety noted to be worse in the morning. She reports taking a Klonopin everyday right before work as work is currently her main stressor. She reports the Klonopin as effective. She states she feels depressed all of the time and is quick to anger and become irritable. She does report the Prozac has been very helpful; however, she feels the effects have tapered and may need to increase the dose. She reports she is not sleeping as she wakes up a lot during the night and wakes up not feeling rested. She reports [facility] called about the referral and they are currently not taking patients. She states she is interested in therapy, but is unsure where to go. Patient denies suicidal or homicidal ideations.   Patient also request a refill for Adderall. She denies any unintentional weight loss and states her appetite has been better. She does report the addition of Prozac to the Adderall seems to help her focus. She denies any other side effects from the medications.   Patient filled out the [employer] and screened positive. Educate patient based on  these results, she would need a home based sleep study to rule out sleep apnea.      Observations/Objective: Today's visit was via telephone Physical exam was not possible for this visit Alert, oriented. Calm, cheerful affect. Thoughts logical, coherent and relevant.     Assessment and Plan: Problem List Items Addressed This Visit      Other   ADD (attention deficit disorder)   Generalized anxiety disorder - Primary   Relevant Medications   FLUoxetine (PROZAC) 40 MG capsule    Other Visit Diagnoses    Fatigue, unspecified type       Relevant Orders   Ambulatory referral to Sleep Studies   Snoring       Relevant Orders   Ambulatory referral to Sleep Studies     Meds ordered this encounter  Medications  . FLUoxetine (PROZAC) 40 MG capsule    Sig: Take 1 capsule (40 mg total) by mouth daily.    Dispense:  30 capsule    Refill:  1    Order Specific Question:   Supervising Provider    Answer:   Lilyan Punt A [[id number]]  . clonazePAM (KLONOPIN) 0.5 MG tablet    Sig: Take 1/2 - 1 tab po BID prn anxiety    Dispense:  30 tablet    Refill:  0    Order Specific Question:   Supervising Provider    Answer:   Lilyan Punt A [[id number]]  . DISCONTD: amphetamine-dextroamphetamine (ADDERALL) 20 MG tablet    Sig: Take one po  qam, then 1/2 tablet 4 hours later. May take an additional 1/2 tablet another 4 hours later prn.    Dispense:  60 tablet    Refill:  0    Order Specific Question:   Supervising Provider    Answer:   Sallee Lange A [[id number]]  . DISCONTD: amphetamine-dextroamphetamine (ADDERALL) 20 MG tablet    Sig: Take one po qam, then 1/2 tablet 4 hours later. May take an additional 1/2 tablet another 4 hours later prn.    Dispense:  60 tablet    Refill:  0    May fill 30 days from 06/04/2019    Order Specific Question:   Supervising Provider    Answer:   Sallee Lange A [[id number]]  . amphetamine-dextroamphetamine (ADDERALL) 20 MG tablet    Sig: Take one po qam, then 1/2 tablet 4 hours  later. May take an additional 1/2 tablet another 4 hours later prn.    Dispense:  60 tablet    Refill:  0    May fill 60 days from 06/04/2019    Order Specific Question:   Supervising Provider    Answer:   Sallee Lange A [[id number]]   Refill the Klonopin as this is helping with her anxiety. Continue Adderall as directed.  Increase the Prozac to 40mg  daily. DC med and contact office if any significant adverse effects. Refer for mental health counseling Refer for HBST for possible sleep apnea.   Follow Up Instructions: Return in about 1 month (around 07/05/2019). Call back sooner if needed.    I discussed the assessment and treatment plan with the patient. The patient was provided an opportunity to ask questions and all were answered. The patient agreed with the plan and demonstrated an understanding of the instructions.   The patient was advised to call back or seek an in-person evaluation if the symptoms worsen or if the condition fails to improve as anticipated.  I provided 15 minutes of non-face-to-face time during this encounter.      Review of Systems     Objective:   Physical Exam        Assessment & Plan:

## 2019-06-07 ENCOUNTER — Other Ambulatory Visit: Payer: Self-pay | Admitting: *Deleted

## 2019-06-07 DIAGNOSIS — R0683 Snoring: Secondary | ICD-10-CM

## 2019-06-07 DIAGNOSIS — R5383 Other fatigue: Secondary | ICD-10-CM

## 2019-06-10 ENCOUNTER — Encounter: Payer: Self-pay | Admitting: Family Medicine

## 2019-06-15 ENCOUNTER — Other Ambulatory Visit (HOSPITAL_BASED_OUTPATIENT_CLINIC_OR_DEPARTMENT_OTHER): Payer: Self-pay

## 2019-06-22 ENCOUNTER — Encounter: Payer: Self-pay | Admitting: Advanced Practice Midwife

## 2019-06-23 ENCOUNTER — Encounter: Payer: Self-pay | Admitting: Advanced Practice Midwife

## 2019-06-23 ENCOUNTER — Other Ambulatory Visit: Payer: Self-pay

## 2019-06-23 ENCOUNTER — Ambulatory Visit (INDEPENDENT_AMBULATORY_CARE_PROVIDER_SITE_OTHER): Payer: Medicaid Other | Admitting: Advanced Practice Midwife

## 2019-06-23 VITALS — BP 107/55 | HR 67 | Ht 68.0 in | Wt 166.0 lb

## 2019-06-23 DIAGNOSIS — Z3046 Encounter for surveillance of implantable subdermal contraceptive: Secondary | ICD-10-CM

## 2019-06-23 DIAGNOSIS — Z3202 Encounter for pregnancy test, result negative: Secondary | ICD-10-CM | POA: Diagnosis not present

## 2019-06-23 DIAGNOSIS — Z30017 Encounter for initial prescription of implantable subdermal contraceptive: Secondary | ICD-10-CM

## 2019-06-23 LAB — POCT URINE PREGNANCY: Preg Test, Ur: NEGATIVE

## 2019-06-23 MED ORDER — ETONOGESTREL 68 MG ~~LOC~~ IMPL
68.0000 mg | DRUG_IMPLANT | Freq: Once | SUBCUTANEOUS | Status: AC
  Administered 2019-06-23: 68 mg via SUBCUTANEOUS

## 2019-06-23 NOTE — Patient Instructions (Signed)
Nexplanon Instructions After Insertion  Keep bandage clean and dry for 24 hours  May use ice/Tylenol/Ibuprofen for soreness or pain  If you develop fever, drainage or increased warmth from incision site-contact office immediately   

## 2019-06-23 NOTE — Progress Notes (Signed)
   [place] AND RE-INSERTION Patient name: Tracy Flores MRN [id number]  Date of birth: June 10, 1991 Subjective Findings:   Tracy Flores is a 28 y.o. G91P1001 Caucasian female being seen today for Nexplanon removal and re-insertion. Her Nexplanon was placed in her L arm.   Patient's last menstrual period was 06/07/2019. Last pap May 2017. Results were:  abnormal  : LSIL, needed colpo, which is doesn't appear she received  Risks/benefits/side effects of Nexplanon have been discussed and her questions have been answered.  Specifically, a failure rate of 07/998 has been reported, with an increased failure rate if pt takes Merna and/or antiseizure medicaitons.  She is aware of the common side effect of irregular bleeding, which the incidence of decreases over time. Signed copy of informed consent in chart.  Pertinent History Reviewed:   Reviewed past medical,surgical, social, obstetrical and family history.  Reviewed problem list, medications and allergies. Objective Findings & Procedure:    Vitals:   06/23/19 1127  BP: (!) 107/55  Pulse: 67  Weight: 166 lb (75.3 kg)  Height: 5\' 8"  (1.727 m)  Body mass index is 25.24 kg/m.  Results for orders placed or performed in visit on 06/23/19 (from the past 24 hour(s))  POCT urine pregnancy   Collection Time: 06/23/19 11:43 AM  Result Value Ref Range   Preg Test, Ur Negative Negative     Time out was performed.  Nexplanon site identified.  Area prepped in usual sterile fashon. Two cc's of 2% lidocaine was used to anesthetize the area. A small stab incision was made right beside the implant on the distal portion.  The Nexplanon rod was grasped using hemostats and removed intact without difficulty.  The area was cleansed again with betadine and the Nexplanon was inserted approximately 10cm from the medial epicondyle and 3-5cm posterior to the sulcus per manufacturer's recommendations without difficulty.  Steri-strips and a pressure  bandage was applied.  There was less than 3 cc blood loss. There were no complications.  The patient tolerated the procedure well. Assessment & Plan:   1) Nexplanon removal & re-insertion She was instructed to keep the area clean and dry, remove pressure bandage in 24 hours, and keep insertion site covered with the steri-strips for 3-5 days.  She was given a card indicating date Nexplanon was inserted and date it needs to be removed.  Follow-up PRN problems.  Orders Placed This Encounter  Procedures  . POCT urine pregnancy    Follow-up: Return for ASAP, Pap & Physical.  Myrtis Ser [facility] 06/23/2019 2:40 PM

## 2019-07-07 ENCOUNTER — Other Ambulatory Visit (HOSPITAL_COMMUNITY)
Admission: RE | Admit: 2019-07-07 | Discharge: 2019-07-07 | Disposition: A | Discharge status: Home / Self Care | Payer: Medicaid Other | Source: Ambulatory Visit | Attending: Advanced Practice Midwife | Admitting: Advanced Practice Midwife

## 2019-07-07 ENCOUNTER — Other Ambulatory Visit: Payer: Self-pay

## 2019-07-07 ENCOUNTER — Ambulatory Visit (INDEPENDENT_AMBULATORY_CARE_PROVIDER_SITE_OTHER): Payer: Medicaid Other | Admitting: Advanced Practice Midwife

## 2019-07-07 ENCOUNTER — Encounter: Payer: Self-pay | Admitting: Advanced Practice Midwife

## 2019-07-07 VITALS — BP 110/68 | HR 69 | Ht 68.0 in | Wt 166.0 lb

## 2019-07-07 DIAGNOSIS — Z01419 Encounter for gynecological examination (general) (routine) without abnormal findings: Secondary | ICD-10-CM | POA: Diagnosis not present

## 2019-07-07 DIAGNOSIS — Z0000 Encounter for general adult medical examination without abnormal findings: Secondary | ICD-10-CM | POA: Diagnosis not present

## 2019-07-07 DIAGNOSIS — L02229 Furuncle of trunk, unspecified: Secondary | ICD-10-CM | POA: Insufficient documentation

## 2019-07-07 MED ORDER — DOXYCYCLINE HYCLATE 50 MG PO CAPS: 100.0000 mg | ORAL_CAPSULE | Freq: Two times a day (BID) | ORAL | 0 refills | Status: AC

## 2019-07-07 NOTE — Progress Notes (Signed)
   WELL-WOMAN EXAMINATION Patient name: Tracy Flores MRN [id number]  Date of birth: 10/13/1990 Chief Complaint:   Gynecologic Exam  History of Present Illness:   Tracy Flores is a 29 y.o. G12P1001 Caucasian female being seen today for a routine well-woman exam.  Current complaints: boil on L mons  PCP: Dr Gerda Diss      does not desire labs Patient's last menstrual period was 06/07/2019. The current method of family planning is Nexplanon.  Last pap 10/2015. Results were: LGSIL> unsure if had colpo. H/O abnormal pap: Yes Last mammogram: n/a. . Family h/o breast cancer: No- unsure Last colonoscopy: n/a.  Family h/o colorectal cancer: No Review of Systems:   Pertinent items are noted in HPI Denies any headaches, blurred vision, fatigue, shortness of breath, chest pain, abdominal pain, abnormal vaginal discharge/itching/odor/irritation, problems with periods, bowel movements, urination, or intercourse unless otherwise stated above. Pertinent History Reviewed:  Reviewed past medical,surgical, social and family history.  Reviewed problem list, medications and allergies. Physical Assessment:   Vitals:   07/07/19 1009  BP: 110/68  Pulse: 69  Weight: 166 lb (75.3 kg)  Height: 5\' 8"  (1.727 m)  Body mass index is 25.24 kg/m.        Physical Examination:   General appearance - well appearing, and in no distress  Mental status - alert, oriented to person, place, and time  Psych:  She has a normal mood and affect  Skin - warm and dry, normal color, no suspicious lesions noted  Chest - effort normal, all lung fields clear to auscultation bilaterally  Heart - normal rate and regular rhythm  Neck:  midline trachea, no thyromegaly or nodules  Breasts - breasts appear normal, no suspicious masses, no skin or nipple changes or  axillary nodes  Abdomen - soft, nontender, nondistended, no masses or organomegaly  Pelvic - VULVA: on L mons approx 3-4cm furuncle; firm & fluctuant, not loculated    VAGINA: normal appearing vagina with normal color and discharge, no lesions  CERVIX: normal appearing cervix without discharge or lesions, no CMT  Thin prep pap is done with HR HPV cotesting  UTERUS: uterus is felt to be normal size, shape, consistency and nontender   ADNEXA: No adnexal masses or tenderness noted.  Rectal - not examined  Extremities:  No swelling or varicosities noted  Chaperone: Amanda Rash    No results found for this or any previous visit (from the past 24 hour(s)).  Assessment & Plan:  1) Well-Woman Exam  2) Nexplanon for contraception, doing well  3) Furuncle, rec warm compresses and rx doxycycline 100mg  x 10d (has multiple med allergies)  Labs/procedures today: Pap  Mammogram age 65 or sooner if problems Colonoscopy age 75 or sooner if problems  No orders of the defined types were placed in this encounter.   Meds: No orders of the defined types were placed in this encounter.   Follow-up: Return in about 1 year (around 07/06/2020) for Physical.  [facility] 07/07/2019 10:43 AM

## 2019-07-07 NOTE — Patient Instructions (Signed)

## 2019-07-08 ENCOUNTER — Other Ambulatory Visit (HOSPITAL_COMMUNITY)
Admission: RE | Admit: 2019-07-08 | Discharge: 2019-07-08 | Disposition: A | Discharge status: Home / Self Care | Payer: Medicaid Other | Source: Ambulatory Visit | Attending: Neurology | Admitting: Neurology

## 2019-07-08 DIAGNOSIS — Z20822 Contact with and (suspected) exposure to covid-19: Secondary | ICD-10-CM | POA: Diagnosis not present

## 2019-07-08 DIAGNOSIS — Z01812 Encounter for preprocedural laboratory examination: Secondary | ICD-10-CM | POA: Diagnosis not present

## 2019-07-08 LAB — SARS CORONAVIRUS 2 (TAT 6-24 HRS): SARS Coronavirus 2: NEGATIVE

## 2019-07-11 ENCOUNTER — Other Ambulatory Visit: Payer: Self-pay

## 2019-07-11 ENCOUNTER — Ambulatory Visit: Payer: Medicaid Other | Attending: Nurse Practitioner | Admitting: Neurology

## 2019-07-11 DIAGNOSIS — G4733 Obstructive sleep apnea (adult) (pediatric): Secondary | ICD-10-CM | POA: Diagnosis not present

## 2019-07-11 DIAGNOSIS — R0683 Snoring: Secondary | ICD-10-CM | POA: Insufficient documentation

## 2019-07-11 DIAGNOSIS — R5383 Other fatigue: Secondary | ICD-10-CM | POA: Diagnosis not present

## 2019-07-12 LAB — CYTOLOGY - PAP
Chlamydia: NEGATIVE
Comment: NEGATIVE
Comment: NEGATIVE
Comment: NEGATIVE
Comment: NEGATIVE
Comment: NORMAL
Diagnosis: HIGH — AB
HPV 16: POSITIVE — AB
HPV 18 / 45: NEGATIVE
High risk HPV: POSITIVE — AB
Neisseria Gonorrhea: NEGATIVE

## 2019-07-16 ENCOUNTER — Encounter: Payer: Medicaid Other | Admitting: Obstetrics and Gynecology

## 2019-07-16 ENCOUNTER — Other Ambulatory Visit: Payer: Self-pay | Admitting: Nurse Practitioner

## 2019-07-24 NOTE — Procedures (Signed)
Milan A. Merlene Laughter, MD     [email or web address]             NOCTURNAL POLYSOMNOGRAPHY   LOCATION: [facility]   Patient Name: Tracy Flores, Tracy Flores Study Date: 07/11/2019 Gender: Female D.O.B: [birth year] Age (years): 28 Referring Provider: Nilda Simmer NP Height (inches): 68 Interpreting Physician: Phillips Odor MD, ABSM Weight (lbs): 166 RPSGT: Rosebud Poles BMI: 25 MRN: [id number] Neck Size: 14.50 CLINICAL INFORMATION Sleep Study Type: NPSG     Indication for sleep study: Fatigue, Snoring     Epworth Sleepiness Score: 12     SLEEP STUDY TECHNIQUE As per the AASM Manual for the Scoring of Sleep and Associated Events v2.3 (April 2016) with a hypopnea requiring 4% desaturations.  The channels recorded and monitored were frontal, central and occipital EEG, electrooculogram (EOG), submentalis EMG (chin), nasal and oral airflow, thoracic and abdominal wall motion, anterior tibialis EMG, snore microphone, electrocardiogram, and pulse oximetry.  MEDICATIONS Medications self-administered by patient taken the night of the study : N/A  Current Outpatient Medications:  .  amphetamine-dextroamphetamine (ADDERALL) 20 MG tablet, Take one po qam, then 1/2 tablet 4 hours later. May take an additional 1/2 tablet another 4 hours later prn., Disp: 60 tablet, Rfl: 0 .  clonazePAM (KLONOPIN) 0.5 MG tablet, TAKE ONE-HALF TO ONE TABLET BY MOUTH TWICE A DAY AS NEEDED FOR ANXIETY, Disp: 30 tablet, Rfl: 0 .  FLUoxetine (PROZAC) 40 MG capsule, Take 1 capsule (40 mg total) by mouth daily., Disp: 30 capsule, Rfl: 1 .  meloxicam (MOBIC) 15 MG tablet, Take 1 tablet (15 mg total) by mouth daily. Prn pain; DC if stomach upset (Patient not taking: Reported on 07/07/2019), Disp: 30 tablet, Rfl: 0 .  methocarbamol (ROBAXIN) 500 MG tablet, Take 1 tablet (500 mg total) by mouth every 8 (eight) hours as needed for muscle spasms. (Patient not taking: Reported on 06/23/2019), Disp: 30  tablet, Rfl: 0 .  valACYclovir (VALTREX) 1000 MG tablet, TAKE ONE TABLET ONCE DAILY (Patient not taking: Reported on 06/23/2019), Disp: 30 tablet, Rfl: 6  Current Facility-Administered Medications:  .  etonogestrel (IMPLANON) implant 68 mg, 68 mg, Subcutaneous, Once, Cresenzo-Dishmon, Joaquim Lai, CNM     SLEEP ARCHITECTURE The study was initiated at 9:49:15 PM and ended at 4:32:41 AM.  Sleep onset time was 21.7 minutes and the sleep efficiency was 91.0%%. The total sleep time was 367.2 minutes.  Stage REM latency was 272.5 minutes.  The patient spent 7.6%% of the night in stage N1 sleep, 62.3%% in stage N2 sleep, 18.0%% in stage N3 and 12.1% in REM.  Alpha intrusion was absent.  Supine sleep was 9.27%.  RESPIRATORY PARAMETERS The overall apnea/hypopnea index (AHI) was 1.6 per hour. There were 10 total apneas, including 0 obstructive, 10 central and 0 mixed apneas. There were 0 hypopneas and 0 RERAs.  The AHI during Stage REM sleep was 2.7 per hour.  AHI while supine was 14.1 per hour.  The mean oxygen saturation was 93.5%. The minimum SpO2 during sleep was 90.0%.  soft snoring was noted during this study.  CARDIAC DATA The 2 lead EKG demonstrated sinus rhythm. The mean heart rate was 61.1 beats per minute. Other EKG findings include: None.  LEG MOVEMENT DATA Moderate to severe periodic limb movements are noted.   IMPRESSIONS 1. Moderate to severe periodic limb movements are noted.  2. No significant obstructive sleep apnea occurred during this study. 3. No significant central sleep apnea occurred during this study.  Delano Metz, MD Diplomate,  American Board of Sleep Medicine.  ELECTRONICALLY SIGNED ON:  07/24/2019, 12:45 PM [facility] PH: [phone]   FX: [phone] ACCREDITED BY THE AMERICAN ACADEMY OF SLEEP MEDICINE

## 2019-07-26 ENCOUNTER — Encounter: Payer: Medicaid Other | Admitting: Obstetrics and Gynecology

## 2019-07-28 NOTE — Progress Notes (Signed)
Tracy Flores, I am not sure who is doing messages today. Please let patient know that she does not have sleep apnea but did show signs of restless legs during sleep. We can discuss that at some point if she wants. Thanks, [name]

## 2019-07-28 NOTE — Progress Notes (Signed)
Called pt and discussed with her and she did want an appt to discuss restlesless legs. Transferred to the front to schedule.

## 2019-07-30 NOTE — Progress Notes (Signed)
Noted. Thanks.

## 2019-08-06 ENCOUNTER — Other Ambulatory Visit: Payer: Self-pay

## 2019-08-06 ENCOUNTER — Ambulatory Visit (INDEPENDENT_AMBULATORY_CARE_PROVIDER_SITE_OTHER): Payer: Medicaid Other | Admitting: Nurse Practitioner

## 2019-08-06 DIAGNOSIS — G4761 Periodic limb movement disorder: Secondary | ICD-10-CM | POA: Diagnosis not present

## 2019-08-06 NOTE — Progress Notes (Signed)
  PHONE VISIT Subjective:    Patient ID: Tracy Flores, female    DOB: Oct 11, 1990, 29 y.o.   MRN: [id number]  HPIpt states she found out last week that she had restless legs while doing a sleep study.   Virtual Visit via Telephone Note  I connected with Tracy Flores on 08/06/19 at  9:20 AM EST by telephone and verified that I am speaking with the correct person using two identifiers.  Location: Patient: home Provider: office   I discussed the limitations, risks, security and privacy concerns of performing an evaluation and management service by telephone and the availability of in person appointments. I also discussed with the patient that there may be a patient responsible charge related to this service. The patient expressed understanding and agreed to proceed.   History of Present Illness: Presents to discuss findings on her recent sleep study. Has difficulty sleeping. Wakes up a lot. "Tosses and turns". Never feels rested. Uses Klonopin mainly during the day due to stress at work. Does not take it at night. Has not seen mental health at this time. Has been doing better since starting Prozac.    Observations/Objective: Today's visit was via telephone Physical exam was not possible for this visit Alert, oriented. Calm affect. Speech clear. Thoughts logical, coherent and relevant.  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 pramipexole (MIRAPEX) 0.125 MG tablet    Sig: Take one tab po 1-2 hours before bedtime for restless legs    Dispense:  30 tablet    Refill:  0    Order Specific Question:   Supervising Provider    Answer:   Lilyan Punt A [[id number]]    Follow Up Instructions: Start with a trial of low dose Mirapex with plans to titrate if needed.  DC med and call if any adverse  effects. Contact office in 1-2 weeks for update. Return in about 3 months (around 11/03/2019).    I discussed the assessment and treatment plan with the patient. The patient was provided an opportunity to ask questions and all were answered. The patient agreed with the plan and demonstrated an understanding of the instructions.   The patient was advised to call back or seek an in-person evaluation if the symptoms worsen or if the condition fails to improve as anticipated.  I provided 15 minutes of non-face-to-face time during this encounter.        Review of Systems     Objective:   Physical Exam        Assessment & Plan:

## 2019-08-07 ENCOUNTER — Encounter: Payer: Self-pay | Admitting: Nurse Practitioner

## 2019-08-07 DIAGNOSIS — G4761 Periodic limb movement disorder: Secondary | ICD-10-CM | POA: Insufficient documentation

## 2019-08-07 MED ORDER — PRAMIPEXOLE DIHYDROCHLORIDE 0.125 MG PO TABS: ORAL_TABLET | ORAL | 0 refills | Status: DC

## 2019-08-10 ENCOUNTER — Other Ambulatory Visit: Payer: Self-pay | Admitting: Obstetrics and Gynecology

## 2019-08-10 ENCOUNTER — Other Ambulatory Visit: Payer: Self-pay

## 2019-08-10 ENCOUNTER — Ambulatory Visit (INDEPENDENT_AMBULATORY_CARE_PROVIDER_SITE_OTHER): Payer: Medicaid Other | Admitting: Obstetrics and Gynecology

## 2019-08-10 ENCOUNTER — Encounter: Payer: Self-pay | Admitting: Obstetrics and Gynecology

## 2019-08-10 VITALS — BP 125/69 | HR 86 | Ht 68.0 in | Wt 166.6 lb

## 2019-08-10 DIAGNOSIS — D069 Carcinoma in situ of cervix, unspecified: Secondary | ICD-10-CM

## 2019-08-10 DIAGNOSIS — Z3202 Encounter for pregnancy test, result negative: Secondary | ICD-10-CM | POA: Diagnosis not present

## 2019-08-10 DIAGNOSIS — N871 Moderate cervical dysplasia: Secondary | ICD-10-CM | POA: Diagnosis not present

## 2019-08-10 LAB — POCT URINE PREGNANCY: Preg Test, Ur: NEGATIVE

## 2019-08-10 NOTE — Progress Notes (Addendum)
Patient ID: [name], female   DOB: 06/24/1991, 29 y.o.   MRN: [id number]   ARTISHA CAPRI 29 y.o. 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  Discussed role for HPV in cervical dysplasia, need for surveillance.  Patient given informed consent, signed copy in the chart, time out was performed.  Placed in lithotomy position. Cervix viewed with speculum and colposcope after application of acetic acid.   Colposcopy adequate? Yes visible aceto-white lesion(s) on anterior lip at 9 o'clock to 1 o'clock posterior lip at 4 o'clock to 8 o'clock; biopsies obtained at posterior lip at 6 o'clock & anterior lip 10 o'clock.   ECC specimen NOT obtained. All specimens were labelled and sent to pathology.       Colposcopy IMPRESSION: 1. Moderate dysplasia on posterior lip 2. Squamous metaplasia anterior lip   Discussion: Discussed with pt progression of abnormal pap smear results. Pt advised that low risk +HPV, ASCUS and LSIL typically revert back to normal cells and treatment plan is typically yearly surveillance for 3 years. Pt advised that colposcopy is typically only indicated with ASCUS and LSIL with +HPV. 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 Pt advised that it typically takes 5 years or more to go from high grade abnormality to cervical cancer.   At end of discussion, pt had opportunity to ask questions and has no further questions at this time.   Specific discussion of HPV as noted above. Greater than 50% was spent in counseling and coordination of care with the patient.   Pt felt light headed after procedure and had syncope episode with siezure like twitching. Loss of consciousness for less than 10 seconds. Verbal stating that she is fine after regaining consciousness. BP after syncope episode 111/57 pulse 56  She was lain back into  supine position and bed was places in reverse trendelenburg position for 5 minutes. Second BP reading was 99/66 pulse 72.  Orthostatic sitting BP 120/67 pulse 80. She was kept in office for an additional 20 minutes, water and banana snack,consumed before being released to drive to work at [employer]  Will follow up pathology and manage accordingly.  Routine preventative health maintenance measures emphasized.  By signing my name below, I, Arnette Norris, attest that this documentation has been prepared under the direction and in the presence of Tilda Burrow, MD. Electronically Signed: Arnette Norris Medical Scribe. 08/10/19. 1:25 PM.  I personally performed the services described in this documentation, which was SCRIBED in my presence. The recorded information has been reviewed and considered accurate. It has been edited as necessary during review. Tilda Burrow, MD

## 2019-08-16 NOTE — Progress Notes (Signed)
Moderate to severe abnormalities, will require treatment by LEEP in office or [facility] thru day surgery.

## 2019-08-24 ENCOUNTER — Other Ambulatory Visit: Payer: Self-pay | Admitting: Nurse Practitioner

## 2019-08-24 NOTE — Telephone Encounter (Signed)
May refill with 3 additional refills please pend I will sign thank you

## 2019-08-25 ENCOUNTER — Telehealth: Payer: Self-pay | Admitting: *Deleted

## 2019-08-25 ENCOUNTER — Encounter: Payer: Self-pay | Admitting: *Deleted

## 2019-08-25 NOTE — Telephone Encounter (Signed)
Attempted to reach patient to schedule LEEP or [facility]. Will send mychart message.

## 2019-08-26 ENCOUNTER — Other Ambulatory Visit: Payer: Self-pay

## 2019-08-26 ENCOUNTER — Ambulatory Visit (INDEPENDENT_AMBULATORY_CARE_PROVIDER_SITE_OTHER): Payer: Medicaid Other | Admitting: Obstetrics and Gynecology

## 2019-08-26 ENCOUNTER — Encounter: Payer: Self-pay | Admitting: Obstetrics and Gynecology

## 2019-08-26 VITALS — BP 109/68 | HR 72 | Ht 68.0 in | Wt 172.2 lb

## 2019-08-26 DIAGNOSIS — Z3202 Encounter for pregnancy test, result negative: Secondary | ICD-10-CM | POA: Diagnosis not present

## 2019-08-26 LAB — POCT URINE PREGNANCY: Preg Test, Ur: NEGATIVE

## 2019-08-26 NOTE — Progress Notes (Signed)
Patient ID: Tracy Flores, female   DOB: 1990/12/11, 29 y.o.   MRN: [id number]    [facility] Visit  @DATE @            Patient name: Tracy Flores MRN Tracy Flores  Date of birth: April 25, 1991  CC & HPI:  Tracy Flores is a 29 y.o. female presenting today for LEEP. Due to syncope episode during colposcopy biopsy, she has been considered to be a cold knife conization candidate at the hospital under anesthesia and has agreed with procedure. She shared the same concern.  ROS:  ROS +HGSIL w/ +HRHPV 16  Pertinent History Reviewed:   Reviewed: Significant for abnormal PAP Medical         Past Medical History:  Diagnosis Date  . Abnormal pap   . Acid reflux disease   . Anxiety   . History of gonorrhea 2013  . 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 IBS (irritable bowel syndrome)   . Interstitial cystitis   . Major depression   . OCD (obsessive compulsive disorder)   . Pregnancy   . Reactive airway disease   . Reflux   . Restless legs   . URI (upper respiratory infection)   . Vaginal Pap smear, abnormal                               Surgical Hx:    Past Surgical History:  Procedure Laterality Date  . NO PAST SURGERIES     Medications: Reviewed & Updated - see associated section                       Current Outpatient Medications:  .  amphetamine-dextroamphetamine (ADDERALL) 20 MG tablet, Take one po qam, then 1/2 tablet 4 hours later. May take an additional 1/2 tablet another 4 hours later prn., Disp: 60 tablet, Rfl: 0 .  clonazePAM (KLONOPIN) 0.5 MG tablet, TAKE ONE-HALF TO ONE TABLET BY MOUTH TWICE A DAY AS NEEDED FOR ANXIETY, Disp: 30 tablet, Rfl: 3 .  etonogestrel (NEXPLANON) 68 MG IMPL implant, 1 each by Subdermal route once., Disp: , Rfl:  .  FLUoxetine (PROZAC) 40 MG capsule, TAKE ONE CAPSULE (40MG  TOTAL) BY MOUTH DAILY, Disp: 30 capsule, Rfl: 3 .  valACYclovir (VALTREX) 1000 MG tablet, TAKE ONE TABLET ONCE DAILY (Patient taking differently: as  needed. ), Disp: 30 tablet, Rfl: 6   Social History: Reviewed -  reports that she has been smoking cigarettes. She has a 2.50 pack-year smoking history. She has never used smokeless tobacco.  Objective Findings:  Vitals: Blood pressure 109/68, pulse 72, height 5\' 8"  (1.727 m), weight 172 lb 3.2 oz (78.1 kg).  PHYSICAL EXAMINATION General appearance - alert, well appearing, and in no distress Mental status - alert, oriented to person, place, and time, normal mood, behavior, speech, dress, motor activity, and thought processes, affect appropriate to mood  PELVIC Deferred  Assessment & Plan:   A:  1.  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  P:  1.  Cold knife conization   By signing my name below, I, 07/2012, attest that this documentation has been prepared under the direction and in the presence of , MD. Electronically Signed: Medical Scribe. 08/26/19. 11:14 AM.  I personally performed the services described in this documentation, which was SCRIBED in my presence. The recorded information has been reviewed and  considered accurate. It has been edited as necessary during review. Jonnie Kind, MD I personally performed the services described in this documentation, which was SCRIBED in my presence. The recorded information has been reviewed and considered accurate. It has been edited as necessary during review. Jonnie Kind, MD

## 2019-09-01 ENCOUNTER — Other Ambulatory Visit: Payer: Self-pay

## 2019-09-01 ENCOUNTER — Ambulatory Visit (INDEPENDENT_AMBULATORY_CARE_PROVIDER_SITE_OTHER): Payer: Medicaid Other | Admitting: Family Medicine

## 2019-09-01 DIAGNOSIS — G43009 Migraine without aura, not intractable, without status migrainosus: Secondary | ICD-10-CM | POA: Diagnosis not present

## 2019-09-01 MED ORDER — NAPROXEN 500 MG PO TABS: ORAL_TABLET | ORAL | 2 refills | Status: DC

## 2019-09-01 MED ORDER — SUMATRIPTAN SUCCINATE 50 MG PO TABS: ORAL_TABLET | ORAL | 2 refills | Status: DC

## 2019-09-01 MED ORDER — ONDANSETRON 4 MG PO TBDP: ORAL_TABLET | ORAL | 2 refills | Status: DC

## 2019-09-01 NOTE — Progress Notes (Signed)
   Subjective:  Audio plus video  Patient ID: Tracy Flores, female    DOB: 05/24/1991, 29 y.o.   MRN: [id number]  Headache  This is a new problem. Episode onset: Saturday  Associated symptoms comments: nausea. Treatments tried: Tylenol, Ibu, OTC meds.   Virtual Visit via Telephone Note  I connected with Tracy Flores on 09/01/19 at 11:00 AM EST by telephone and verified that I am speaking with the correct person using two identifiers.  Location: Patient: home Provider: office   I discussed the limitations, risks, security and privacy concerns of performing an evaluation and management service by telephone and the availability of in person appointments. I also discussed with the patient that there may be a patient responsible charge related to this service. The patient expressed understanding and agreed to proceed.   History of Present Illness:    Observations/Objective:   Assessment and Plan:   Follow Up Instructions:    I discussed the assessment and treatment plan with the patient. The patient was provided an opportunity to ask questions and all were answered. The patient agreed with the plan and demonstrated an understanding of the instructions.   The patient was advised to call back or seek an in-person evaluation if the symptoms worsen or if the condition fails to improve as anticipated.  I provided 23 minutes of non-face-to-face time during this encounter.   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       Objective:   Physical Exam   Virtual     Assessment & Plan:  Impression Common migraine headaches.  2 to 3/month.  Fairly severe in nature.  Add Naprosyn 500 twice daily as needed.  Add Zofran as needed.  Add Imitrex to use only at the start of the migraine headaches.  Work excuse written.  If continue with  same frequency in the coming months may need to consider adding primary prophylaxis discussed.  Multiple questions answered.

## 2019-09-15 ENCOUNTER — Other Ambulatory Visit: Payer: Self-pay | Admitting: Obstetrics and Gynecology

## 2019-09-16 ENCOUNTER — Encounter (HOSPITAL_COMMUNITY): Payer: Self-pay

## 2019-09-16 ENCOUNTER — Other Ambulatory Visit: Payer: Self-pay

## 2019-09-16 ENCOUNTER — Encounter (HOSPITAL_COMMUNITY)
Admission: RE | Admit: 2019-09-16 | Discharge: 2019-09-16 | Disposition: A | Discharge status: Home / Self Care | Payer: Medicaid Other | Source: Ambulatory Visit | Attending: Obstetrics and Gynecology | Admitting: Obstetrics and Gynecology

## 2019-09-16 DIAGNOSIS — Z01812 Encounter for preprocedural laboratory examination: Secondary | ICD-10-CM | POA: Insufficient documentation

## 2019-09-16 LAB — BASIC METABOLIC PANEL
Anion gap: 10 (ref 5–15)
BUN: 18 mg/dL (ref 6–20)
CO2: 21 mmol/L — ABNORMAL LOW (ref 22–32)
Calcium: 8.6 mg/dL — ABNORMAL LOW (ref 8.9–10.3)
Chloride: 108 mmol/L (ref 98–111)
Creatinine, Ser: 0.59 mg/dL (ref 0.44–1.00)
GFR calc Af Amer: >60 mL/min (ref >60)
GFR calc non Af Amer: >60 mL/min (ref >60)
Glucose, Bld: 101 mg/dL — ABNORMAL HIGH (ref 70–99)
Potassium: 3.7 mmol/L (ref 3.5–5.1)
Sodium: 139 mmol/L (ref 135–145)

## 2019-09-16 LAB — CBC
HCT: 40.9 % (ref 36.0–46.0)
Hemoglobin: 13.2 g/dL (ref 12.0–15.0)
MCH: 29.5 pg (ref 26.0–34.0)
MCHC: 32.3 g/dL (ref 30.0–36.0)
MCV: 91.5 fL (ref 80.0–100.0)
Platelets: 228 10*3/uL (ref 150–400)
RBC: 4.47 MIL/uL (ref 3.87–5.11)
RDW: 13.2 % (ref 11.5–15.5)
WBC: 9.4 10*3/uL (ref 4.0–10.5)
nRBC: 0 % (ref 0.0–0.2)

## 2019-09-16 LAB — HCG, SERUM, QUALITATIVE: Preg, Serum: NEGATIVE

## 2019-09-16 NOTE — Patient Instructions (Signed)
TACHA MANNI  09/16/2019     @PREFPERIOPPHARMACY @   Your procedure is scheduled on  09/21/2019 .  Report to [facility] at  1055  A.M.  Call this number if you have problems the morning of surgery:  [phone]   Remember:  Do not eat or drink after midnight.                        Take these medicines the morning of surgery with A SIP OF WATER  Adderall, clonazepam, prozac, zofran (if needed), imitrex(if needed).    Do not wear jewelry, make-up or nail polish.  Do not wear lotions, powders, or perfumes. Please wear deodorant and brush your teeth.  Do not shave 48 hours prior to surgery.  Men may shave face and neck.  Do not bring valuables to the hospital.  [facility] is not responsible for any belongings or valuables.  Contacts, dentures or bridgework may not be worn into surgery.  Leave your suitcase in the car.  After surgery it may be brought to your room.  For patients admitted to the hospital, discharge time will be determined by your treatment team.  Patients discharged the day of surgery will not be allowed to drive home.   Name and phone number of your driver:   family Special instructions:  DO NOT smoke the morning of your procedure.  Please read over the following fact sheets that you were given. Anesthesia Post-op Instructions and Care and Recovery After Surgery       General Anesthesia, Adult, Care After This sheet gives you information about how to care for yourself after your procedure. Your health care provider may also give you more specific instructions. If you have problems or questions, contact your health care provider. What can I expect after the procedure? After the procedure, the following side effects are common:  Pain or discomfort at the IV site.  Nausea.  Vomiting.  Sore throat.  Trouble concentrating.  Feeling cold or chills.  Weak or tired.  Sleepiness and fatigue.  Soreness and body aches. These side effects  can affect parts of the body that were not involved in surgery. Follow these instructions at home:  For at least 24 hours after the procedure:  Have a responsible adult stay with you. It is important to have someone help care for you until you are awake and alert.  Rest as needed.  Do not: ? Participate in activities in which you could fall or become injured. ? Drive. ? Use heavy machinery. ? Drink alcohol. ? Take sleeping pills or medicines that cause drowsiness. ? Make important decisions or sign legal documents. ? Take care of children on your own. Eating and drinking  Follow any instructions from your health care provider about eating or drinking restrictions.  When you feel hungry, start by eating small amounts of foods that are soft and easy to digest (bland), such as toast. Gradually return to your regular diet.  Drink enough fluid to keep your urine pale yellow.  If you vomit, rehydrate by drinking water, juice, or clear broth. General instructions  If you have sleep apnea, surgery and certain medicines can increase your risk for breathing problems. Follow instructions from your health care provider about wearing your sleep device: ? Anytime you are sleeping, including during daytime naps. ? While taking prescription pain medicines, sleeping medicines, or medicines that make you drowsy.  Return  to your normal activities as told by your health care provider. Ask your health care provider what activities are safe for you.  Take over-the-counter and prescription medicines only as told by your health care provider.  If you smoke, do not smoke without supervision.  Keep all follow-up visits as told by your health care provider. This is important. Contact a health care provider if:  You have nausea or vomiting that does not get better with medicine.  You cannot eat or drink without vomiting.  You have pain that does not get better with medicine.  You are unable to  pass urine.  You develop a skin rash.  You have a fever.  You have redness around your IV site that gets worse. Get help right away if:  You have difficulty breathing.  You have chest pain.  You have blood in your urine or stool, or you vomit blood. Summary  After the procedure, it is common to have a sore throat or nausea. It is also common to feel tired.  Have a responsible adult stay with you for the first 24 hours after general anesthesia. It is important to have someone help care for you until you are awake and alert.  When you feel hungry, start by eating small amounts of foods that are soft and easy to digest (bland), such as toast. Gradually return to your regular diet.  Drink enough fluid to keep your urine pale yellow.  Return to your normal activities as told by your health care provider. Ask your health care provider what activities are safe for you. This information is not intended to replace advice given to you by your health care provider. Make sure you discuss any questions you have with your health care provider. Document Revised: 06/20/2017 Document Reviewed: 01/31/2017 Elsevier Patient Education  2020 [employer]  Cervical Conization, Care After This sheet gives you information about how to care for yourself after your procedure. Your doctor may also give you more specific instructions. If you have problems or questions, contact your doctor. What can I expect after the procedure? After the procedure, it is common to have:  A groggy feeling, if you were given medicine to make you fall asleep (general anesthetic).  Cramps that feel like menstrual cramps.  Bloody discharge or light to moderate bleeding.  Dark fluid from the vagina (vaginal discharge). This fluid may look similar to coffee grounds. Follow these instructions at home: Medicines  Take over-the-counter and prescription medicines only as told by your doctor.  Do not take aspirin until your  doctor says it is okay. Activity   Rest as told by your doctor.  For 7-14 days after your procedure, avoid: ? Being very active. ? Exercising. ? Heavy lifting.  Do not lift anything that is heavier than 10 lb (4.5 kg), or the limit that you are told, until your doctor says that it is safe.  Return to your normal activities as told by your doctor. Ask your doctor what activities are safe for you. General instructions   You may eat your normal diet unless your doctor tells you not to do so.  Drink enough fluid to keep your pee (urine) pale yellow.  Do not take baths, swim, or use a hot tub until your doctor approves. Ask your doctor if you may take showers. You may only be allowed to take sponge baths.  Do not douche, have sex, or put anything in the vagina, including tampons, until your doctor says it  is okay.  Keep all follow-up visits as told by your doctor. This is important. Contact a doctor if:  You get a rash.  You are dizzy or light-headed.  You feel like you may vomit (nauseous).  You vomit.  You have fluid from your vagina that smells bad. Get help right away if:  There are bloody clumps (clots) coming from your vagina.  You have more bleeding than you would have in a normal menstrual period. For example, you soak a pad in less than 1 hour.  You have a fever.  You have more and more cramps.  You pass out (faint).  You have pain when peeing.  You have very bad pain, or your pain gets worse. Medicine does not help your pain.  You have blood in your pee. Summary  After the procedure, it is common to have cramps, some bleeding, and dark or bloody fluid from your vagina.  Do not douche, have sex, or use tampons until your doctor says it is okay.  For about 7-14 days after your procedure, try not to exercise or lift heavy objects. This information is not intended to replace advice given to you by your health care provider. Make sure you discuss any  questions you have with your health care provider. Document Revised: 12/14/2018 Document Reviewed: 12/14/2018 Elsevier Patient Education  2020 [employer]. How to Use Chlorhexidine for Bathing Chlorhexidine gluconate (CHG) is a germ-killing (antiseptic) solution that is used to clean the skin. It can get rid of the bacteria that normally live on the skin and can keep them away for about 24 hours. To clean your skin with CHG, you may be given:  A CHG solution to use in the shower or as part of a sponge bath.  A prepackaged cloth that contains CHG. Cleaning your skin with CHG may help lower the risk for infection:  While you are staying in the intensive care unit of the hospital.  If you have a vascular access, such as a central line, to provide short-term or long-term access to your veins.  If you have a catheter to drain urine from your bladder.  If you are on a ventilator. A ventilator is a machine that helps you breathe by moving air in and out of your lungs.  After surgery. What are the risks? Risks of using CHG include:  A skin reaction.  Hearing loss, if CHG gets in your ears.  Eye injury, if CHG gets in your eyes and is not rinsed out.  The CHG product catching fire. Make sure that you avoid smoking and flames after applying CHG to your skin. Do not use CHG:  If you have a chlorhexidine allergy or have previously reacted to chlorhexidine.  On babies younger than 26 months of age. How to use CHG solution  Use CHG only as told by your health care provider, and follow the instructions on the label.  Use the full amount of CHG as directed. Usually, this is one bottle. During a shower Follow these steps when using CHG solution during a shower (unless your health care provider gives you different instructions): 1. Start the shower. 2. Use your normal soap and shampoo to wash your face and hair. 3. Turn off the shower or move out of the shower stream. 4. Pour the CHG  onto a clean washcloth. Do not use any type of brush or rough-edged sponge. 5. Starting at your neck, lather your body down to your toes. Make sure you  follow these instructions: ? If you will be having surgery, pay special attention to the part of your body where you will be having surgery. Scrub this area for at least 1 minute. ? Do not use CHG on your head or face. If the solution gets into your ears or eyes, rinse them well with water. ? Avoid your genital area. ? Avoid any areas of skin that have broken skin, cuts, or scrapes. ? Scrub your back and under your arms. Make sure to wash skin folds. 6. Let the lather sit on your skin for 1-2 minutes or as long as told by your health care provider. 7. Thoroughly rinse your entire body in the shower. Make sure that all body creases and crevices are rinsed well. 8. Dry off with a clean towel. Do not put any substances on your body afterward--such as powder, lotion, or perfume--unless you are told to do so by your health care provider. Only use lotions that are recommended by the manufacturer. 9. Put on clean clothes or pajamas. 10. If it is the night before your surgery, sleep in clean sheets.  During a sponge bath Follow these steps when using CHG solution during a sponge bath (unless your health care provider gives you different instructions): 1. Use your normal soap and shampoo to wash your face and hair. 2. Pour the CHG onto a clean washcloth. 3. Starting at your neck, lather your body down to your toes. Make sure you follow these instructions: ? If you will be having surgery, pay special attention to the part of your body where you will be having surgery. Scrub this area for at least 1 minute. ? Do not use CHG on your head or face. If the solution gets into your ears or eyes, rinse them well with water. ? Avoid your genital area. ? Avoid any areas of skin that have broken skin, cuts, or scrapes. ? Scrub your back and under your arms. Make  sure to wash skin folds. 4. Let the lather sit on your skin for 1-2 minutes or as long as told by your health care provider. 5. Using a different clean, wet washcloth, thoroughly rinse your entire body. Make sure that all body creases and crevices are rinsed well. 6. Dry off with a clean towel. Do not put any substances on your body afterward--such as powder, lotion, or perfume--unless you are told to do so by your health care provider. Only use lotions that are recommended by the manufacturer. 7. Put on clean clothes or pajamas. 8. If it is the night before your surgery, sleep in clean sheets. How to use CHG prepackaged cloths  Only use CHG cloths as told by your health care provider, and follow the instructions on the label.  Use the CHG cloth on clean, dry skin.  Do not use the CHG cloth on your head or face unless your health care provider tells you to.  When washing with the CHG cloth: ? Avoid your genital area. ? Avoid any areas of skin that have broken skin, cuts, or scrapes. Before surgery Follow these steps when using a CHG cloth to clean before surgery (unless your health care provider gives you different instructions): 1. Using the CHG cloth, vigorously scrub the part of your body where you will be having surgery. Scrub using a back-and-forth motion for 3 minutes. The area on your body should be completely wet with CHG when you are done scrubbing. 2. Do not rinse. Discard the cloth and let  the area air-dry. Do not put any substances on the area afterward, such as powder, lotion, or perfume. 3. Put on clean clothes or pajamas. 4. If it is the night before your surgery, sleep in clean sheets.  For general bathing Follow these steps when using CHG cloths for general bathing (unless your health care provider gives you different instructions). 1. Use a separate CHG cloth for each area of your body. Make sure you wash between any folds of skin and between your fingers and toes. Wash  your body in the following order, switching to a new cloth after each step: ? The front of your neck, shoulders, and chest. ? Both of your arms, under your arms, and your hands. ? Your stomach and groin area, avoiding the genitals. ? Your right leg and foot. ? Your left leg and foot. ? The back of your neck, your back, and your buttocks. 2. Do not rinse. Discard the cloth and let the area air-dry. Do not put any substances on your body afterward--such as powder, lotion, or perfume--unless you are told to do so by your health care provider. Only use lotions that are recommended by the manufacturer. 3. Put on clean clothes or pajamas. Contact a health care provider if:  Your skin gets irritated after scrubbing.  You have questions about using your solution or cloth. Get help right away if:  Your eyes become very red or swollen.  Your eyes itch badly.  Your skin itches badly and is red or swollen.  Your hearing changes.  You have trouble seeing.  You have swelling or tingling in your mouth or throat.  You have trouble breathing.  You swallow any chlorhexidine. Summary  Chlorhexidine gluconate (CHG) is a germ-killing (antiseptic) solution that is used to clean the skin. Cleaning your skin with CHG may help to lower your risk for infection.  You may be given CHG to use for bathing. It may be in a bottle or in a prepackaged cloth to use on your skin. Carefully follow your health care provider's instructions and the instructions on the product label.  Do not use CHG if you have a chlorhexidine allergy.  Contact your health care provider if your skin gets irritated after scrubbing. This information is not intended to replace advice given to you by your health care provider. Make sure you discuss any questions you have with your health care provider. Document Revised: 09/03/2018 Document Reviewed: 05/15/2017 Elsevier Patient Education  2020 [employer].

## 2019-09-16 NOTE — Pre-Procedure Instructions (Signed)
Patient in for PAT. She told us that she "passes out easy". Laid patient back in recliner with feet elevated when drew labs. She did pass out with diaphoresis. Only was out for about 1-2 seconds. Cool cloths to head and given Cola to drink. Had her wait 5 minutes after this before we slowly raised her head and put her feet down. She stayed with [place] for about 7 minutes after this before we let her go. She states, "this happens all the time." Would advise her to lie flat with cool clothes and in a cool room as well as someone holding her hand and talking with her when start IV for surgery.

## 2019-09-20 ENCOUNTER — Telehealth: Payer: Self-pay | Admitting: *Deleted

## 2019-09-20 ENCOUNTER — Other Ambulatory Visit: Payer: Self-pay

## 2019-09-20 ENCOUNTER — Other Ambulatory Visit (HOSPITAL_COMMUNITY)
Admission: RE | Admit: 2019-09-20 | Discharge: 2019-09-20 | Disposition: A | Discharge status: Home / Self Care | Payer: Medicaid Other | Source: Ambulatory Visit | Attending: Obstetrics and Gynecology | Admitting: Obstetrics and Gynecology

## 2019-09-20 DIAGNOSIS — Z20822 Contact with and (suspected) exposure to covid-19: Secondary | ICD-10-CM | POA: Insufficient documentation

## 2019-09-20 DIAGNOSIS — Z01812 Encounter for preprocedural laboratory examination: Secondary | ICD-10-CM | POA: Diagnosis not present

## 2019-09-20 LAB — SARS CORONAVIRUS 2 (TAT 6-24 HRS): SARS Coronavirus 2: NEGATIVE

## 2019-09-20 NOTE — Telephone Encounter (Signed)
Pt is scheduled for conization and LEEP tomorrow. Her work needs to know how long will she be out of work and she needs a work note. Please call. Thanks!!

## 2019-09-21 ENCOUNTER — Ambulatory Visit (HOSPITAL_COMMUNITY)
Admission: RE | Admit: 2019-09-21 | Discharge: 2019-09-21 | Disposition: A | Discharge status: Home / Self Care | Payer: Medicaid Other | Attending: Obstetrics and Gynecology | Admitting: Obstetrics and Gynecology

## 2019-09-21 ENCOUNTER — Encounter (HOSPITAL_COMMUNITY)
Admission: RE | Disposition: A | Discharge status: Home / Self Care | Payer: Self-pay | Source: Home / Self Care | Attending: Obstetrics and Gynecology

## 2019-09-21 ENCOUNTER — Encounter: Payer: Self-pay | Admitting: Obstetrics and Gynecology

## 2019-09-21 ENCOUNTER — Ambulatory Visit (HOSPITAL_COMMUNITY): Payer: Medicaid Other | Admitting: Anesthesiology

## 2019-09-21 ENCOUNTER — Encounter (HOSPITAL_COMMUNITY): Payer: Self-pay | Admitting: Obstetrics and Gynecology

## 2019-09-21 ENCOUNTER — Telehealth: Payer: Self-pay | Admitting: *Deleted

## 2019-09-21 ENCOUNTER — Other Ambulatory Visit: Payer: Self-pay

## 2019-09-21 DIAGNOSIS — Z881 Allergy status to other antibiotic agents status: Secondary | ICD-10-CM | POA: Diagnosis not present

## 2019-09-21 DIAGNOSIS — K219 Gastro-esophageal reflux disease without esophagitis: Secondary | ICD-10-CM | POA: Insufficient documentation

## 2019-09-21 DIAGNOSIS — K589 Irritable bowel syndrome without diarrhea: Secondary | ICD-10-CM | POA: Insufficient documentation

## 2019-09-21 DIAGNOSIS — G2581 Restless legs syndrome: Secondary | ICD-10-CM | POA: Insufficient documentation

## 2019-09-21 DIAGNOSIS — J45909 Unspecified asthma, uncomplicated: Secondary | ICD-10-CM | POA: Insufficient documentation

## 2019-09-21 DIAGNOSIS — Z833 Family history of diabetes mellitus: Secondary | ICD-10-CM | POA: Insufficient documentation

## 2019-09-21 DIAGNOSIS — D069 Carcinoma in situ of cervix, unspecified: Secondary | ICD-10-CM | POA: Diagnosis not present

## 2019-09-21 DIAGNOSIS — N871 Moderate cervical dysplasia: Secondary | ICD-10-CM | POA: Diagnosis not present

## 2019-09-21 DIAGNOSIS — Z809 Family history of malignant neoplasm, unspecified: Secondary | ICD-10-CM | POA: Insufficient documentation

## 2019-09-21 DIAGNOSIS — Z8261 Family history of arthritis: Secondary | ICD-10-CM | POA: Diagnosis not present

## 2019-09-21 DIAGNOSIS — F411 Generalized anxiety disorder: Secondary | ICD-10-CM | POA: Insufficient documentation

## 2019-09-21 DIAGNOSIS — R519 Headache, unspecified: Secondary | ICD-10-CM | POA: Diagnosis not present

## 2019-09-21 DIAGNOSIS — F17210 Nicotine dependence, cigarettes, uncomplicated: Secondary | ICD-10-CM | POA: Diagnosis not present

## 2019-09-21 DIAGNOSIS — F429 Obsessive-compulsive disorder, unspecified: Secondary | ICD-10-CM | POA: Insufficient documentation

## 2019-09-21 DIAGNOSIS — Z880 Allergy status to penicillin: Secondary | ICD-10-CM | POA: Diagnosis not present

## 2019-09-21 DIAGNOSIS — Z888 Allergy status to other drugs, medicaments and biological substances status: Secondary | ICD-10-CM | POA: Insufficient documentation

## 2019-09-21 DIAGNOSIS — F17200 Nicotine dependence, unspecified, uncomplicated: Secondary | ICD-10-CM | POA: Diagnosis not present

## 2019-09-21 DIAGNOSIS — F329 Major depressive disorder, single episode, unspecified: Secondary | ICD-10-CM | POA: Diagnosis not present

## 2019-09-21 DIAGNOSIS — Z79899 Other long term (current) drug therapy: Secondary | ICD-10-CM | POA: Insufficient documentation

## 2019-09-21 DIAGNOSIS — Z818 Family history of other mental and behavioral disorders: Secondary | ICD-10-CM | POA: Insufficient documentation

## 2019-09-21 DIAGNOSIS — Z8249 Family history of ischemic heart disease and other diseases of the circulatory system: Secondary | ICD-10-CM | POA: Insufficient documentation

## 2019-09-21 HISTORY — PX: CERVICAL CONIZATION W/BX: SHX1330

## 2019-09-21 SURGERY — CONE BIOPSY, CERVIX
Anesthesia: General | Site: Cervix

## 2019-09-21 MED ORDER — BUPIVACAINE-EPINEPHRINE 0.5% -1:200000 IJ SOLN
INTRAMUSCULAR | Status: DC | PRN
  Administered 2019-09-21: 10 mL

## 2019-09-21 MED ORDER — TRAMADOL HCL 50 MG PO TABS: 50.0000 mg | ORAL_TABLET | Freq: Four times a day (QID) | ORAL | 0 refills | Status: DC | PRN

## 2019-09-21 MED ORDER — LIDOCAINE HCL (CARDIAC) PF 50 MG/5ML IV SOSY
PREFILLED_SYRINGE | INTRAVENOUS | Status: DC | PRN
  Administered 2019-09-21: 50 mg via INTRAVENOUS

## 2019-09-21 MED ORDER — IODINE STRONG (LUGOLS) 5 % PO SOLN
ORAL | Status: AC
  Filled 2019-09-21: qty 1

## 2019-09-21 MED ORDER — FENTANYL CITRATE (PF) 100 MCG/2ML IJ SOLN: 25.0000 ug | INTRAMUSCULAR | Status: DC | PRN

## 2019-09-21 MED ORDER — LACTATED RINGERS IV SOLN: INTRAVENOUS | Status: DC

## 2019-09-21 MED ORDER — ONDANSETRON HCL 4 MG/2ML IJ SOLN
INTRAMUSCULAR | Status: DC | PRN
  Administered 2019-09-21: 4 mg via INTRAVENOUS

## 2019-09-21 MED ORDER — MIDAZOLAM HCL 5 MG/5ML IJ SOLN
INTRAMUSCULAR | Status: DC | PRN
  Administered 2019-09-21: 2 mg via INTRAVENOUS

## 2019-09-21 MED ORDER — MIDAZOLAM HCL 2 MG/2ML IJ SOLN
INTRAMUSCULAR | Status: AC
  Filled 2019-09-21: qty 2

## 2019-09-21 MED ORDER — FENTANYL CITRATE (PF) 100 MCG/2ML IJ SOLN
INTRAMUSCULAR | Status: AC
  Filled 2019-09-21: qty 2

## 2019-09-21 MED ORDER — IODINE STRONG (LUGOLS) 5 % PO SOLN
ORAL | Status: DC | PRN
  Administered 2019-09-21: 0.2 mL

## 2019-09-21 MED ORDER — BUPIVACAINE-EPINEPHRINE (PF) 0.5% -1:200000 IJ SOLN
INTRAMUSCULAR | Status: AC
  Filled 2019-09-21: qty 30

## 2019-09-21 MED ORDER — 0.9 % SODIUM CHLORIDE (POUR BTL) OPTIME
TOPICAL | Status: DC | PRN
  Administered 2019-09-21: 1000 mL

## 2019-09-21 MED ORDER — PROPOFOL 10 MG/ML IV BOLUS
INTRAVENOUS | Status: DC | PRN
  Administered 2019-09-21: 200 mg via INTRAVENOUS

## 2019-09-21 MED ORDER — ONDANSETRON HCL 4 MG/2ML IJ SOLN
INTRAMUSCULAR | Status: AC
  Filled 2019-09-21: qty 2

## 2019-09-21 MED ORDER — PROPOFOL 10 MG/ML IV BOLUS
INTRAVENOUS | Status: AC
  Filled 2019-09-21: qty 20

## 2019-09-21 MED ORDER — FENTANYL CITRATE (PF) 250 MCG/5ML IJ SOLN
INTRAMUSCULAR | Status: AC
  Filled 2019-09-21: qty 5

## 2019-09-21 MED ORDER — METRONIDAZOLE 0.75 % VA GEL: 1.0000 | VAGINAL | 0 refills | Status: DC

## 2019-09-21 MED ORDER — FENTANYL CITRATE (PF) 100 MCG/2ML IJ SOLN
INTRAMUSCULAR | Status: DC | PRN
  Administered 2019-09-21: 50 ug via INTRAVENOUS
  Administered 2019-09-21: 25 ug via INTRAVENOUS
  Administered 2019-09-21: 50 ug via INTRAVENOUS
  Administered 2019-09-21: 25 ug via INTRAVENOUS

## 2019-09-21 MED ORDER — FERRIC SUBSULFATE 259 MG/GM EX SOLN
CUTANEOUS | Status: DC | PRN
  Administered 2019-09-21: 1

## 2019-09-21 SURGICAL SUPPLY — 27 items
CLOTH BEACON ORANGE TIMEOUT ST (SAFETY) ×3 IMPLANT
COVER LIGHT HANDLE STERIS (MISCELLANEOUS) ×6 IMPLANT
COVER WAND RF STERILE (DRAPES) ×3 IMPLANT
DECANTER SPIKE VIAL GLASS SM (MISCELLANEOUS) ×3 IMPLANT
DRAPE HALF SHEET 40X57 (DRAPES) ×3 IMPLANT
ELECT REM PT RETURN 9FT ADLT (ELECTROSURGICAL) ×3
ELECTRODE REM PT RTRN 9FT ADLT (ELECTROSURGICAL) ×1 IMPLANT
GAUZE 4X4 16PLY RFD (DISPOSABLE) ×3 IMPLANT
GLOVE BIO SURGEON STRL SZ 6.5 (GLOVE) ×1 IMPLANT
GLOVE BIO SURGEON STRL SZ7 (GLOVE) ×2 IMPLANT
GLOVE BIO SURGEONS STRL SZ 6.5 (GLOVE) ×1
GLOVE BIOGEL PI IND STRL 7.0 (GLOVE) ×2 IMPLANT
GLOVE BIOGEL PI IND STRL 9 (GLOVE) ×1 IMPLANT
GLOVE BIOGEL PI INDICATOR 7.0 (GLOVE) ×6
GLOVE BIOGEL PI INDICATOR 9 (GLOVE) ×2
GLOVE ECLIPSE 9.0 STRL (GLOVE) ×3 IMPLANT
GOWN SPEC L3 XXLG W/TWL (GOWN DISPOSABLE) ×3 IMPLANT
GOWN STRL REUS W/TWL LRG LVL3 (GOWN DISPOSABLE) ×5 IMPLANT
KIT TURNOVER CYSTO (KITS) ×3 IMPLANT
MANIFOLD NEPTUNE II (INSTRUMENTS) ×3 IMPLANT
PACK PERI GYN (CUSTOM PROCEDURE TRAY) ×3 IMPLANT
PAD ARMBOARD 7.5X6 YLW CONV (MISCELLANEOUS) ×3 IMPLANT
SCOPETTES 8  STERILE (MISCELLANEOUS) ×3
SCOPETTES 8 STERILE (MISCELLANEOUS) ×1 IMPLANT
SET BASIN LINEN APH (SET/KITS/TRAYS/PACK) ×3 IMPLANT
SUT CHROMIC 2 0 CT 1 (SUTURE) ×6 IMPLANT
SYR CONTROL 10ML LL (SYRINGE) ×3 IMPLANT

## 2019-09-21 NOTE — Op Note (Signed)
09/21/2019  12:28 PM  PATIENT:  Tracy Flores  29 y.o. 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  PROCEDURE:  Procedure(s): COLD KNIFE CONIZATION CERVIX WITH BIOPSY (N/A)  SURGEON:  Surgeon(s) and Role:    * Jarred Purtee V, MD - Primary  PHYSICIAN ASSISTANT:   ASSISTANTS: Wendy Kendrick, CST, Kylie____RNFA  ---  ANESTHESIA:   general and paracervical block  EBL:  20 mL   BLOOD ADMINISTERED:none  DRAINS: none   LOCAL MEDICATIONS USED:  MARCAINE    and Amount: 10 ml  SPECIMEN:  Source of Specimen:  Cervical conization specimen  DISPOSITION OF SPECIMEN:  PATHOLOGY  COUNTS:  YES  TOURNIQUET:  * No tourniquets in log *  DICTATION: .Dragon Dictation  PLAN OF CARE: Discharge to home after PACU  PATIENT DISPOSITION:  PACU - hemodynamically stable.   Delay start of Pharmacological VTE agent (>24hrs) due to surgical blood loss or risk of bleeding: not applicable Details of procedure: Patient was taken the operating room prepped and draped for lower abdominal and vaginal surgery with legs in candycane support.  Timeout was conducted and procedure confirmed by operative team.  Weighted speculum was placed in the posterior vagina, Lugol solution applied to the cervix identifying the areas that did not take up to Lugol solution.  A 2 cm wide thin wafer of conization specimen was then removed.  We began by placing a 0 chromic suture at 3:00 and 9:00 to reduce perfusion of the cervix followed by paracervical block using Marcaine 10 cc then a #15 blade was used to circumscribe the cervix outside the area that did not take up Lugol's.  This thin wafer was then excised intact and Monsel's applied to the surface with good hemostasis this was tagged at 12:00 and sent for histology.  Sponge and needle counts were correct 

## 2019-09-21 NOTE — Telephone Encounter (Signed)
Two attempts made to reach patient prior to our office closing.  Unable to connect with patient.

## 2019-09-21 NOTE — Anesthesia Preprocedure Evaluation (Signed)
Anesthesia Evaluation  Patient identified by MRN, date of birth, ID band Patient awake    Reviewed: Allergy & Precautions, H&P , NPO status , Patient's Chart, lab work & pertinent test results, reviewed documented beta blocker date and time   Airway Mallampati: I  TM Distance: >3 FB Neck ROM: full    Dental no notable dental hx.    Pulmonary Current Smoker,    Pulmonary exam normal        Cardiovascular negative cardio ROS Normal cardiovascular exam     Neuro/Psych  Headaches, PSYCHIATRIC DISORDERS Anxiety Depression    GI/Hepatic Neg liver ROS, GERD  ,  Endo/Other  negative endocrine ROS  Renal/GU negative Renal ROS  negative genitourinary   Musculoskeletal   Abdominal   Peds  Hematology negative hematology ROS (+)   Anesthesia Other Findings   Reproductive/Obstetrics negative OB ROS                             Anesthesia Physical Anesthesia Plan  ASA: II  Anesthesia Plan: General   Post-op Pain Management:    Induction:   PONV Risk Score and Plan: 2 and Ondansetron  Airway Management Planned:   Additional Equipment:   Intra-op Plan:   Post-operative Plan:   Informed Consent: I have reviewed the patients History and Physical, chart, labs and discussed the procedure including the risks, benefits and alternatives for the proposed anesthesia with the patient or authorized representative who has indicated his/her understanding and acceptance.       Plan Discussed with: CRNA  Anesthesia Plan Comments:         Anesthesia Quick Evaluation

## 2019-09-21 NOTE — H&P (Signed)
Preoperative History and Physical  Tracy Flores is a 29 y.o. 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   No significant preoperative concerns, other than the patient became syncopal from cervical biopsies, so was not considered a candidate for office procedure such as LEEP. She had acetowhite lesions of the anterior lip from 9 oclock to 1 oclock and posterior lip from 4-8.  Endocervix was visually clear on adequate colposcopy Proposed surgery: Cold Knife conization of the cervix.  Past Medical History:  Diagnosis Date  . Abnormal pap   . Acid reflux disease   . Anxiety   . History of gonorrhea 2013  . 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 IBS (irritable bowel syndrome)   . Interstitial cystitis   . Major depression   . OCD (obsessive compulsive disorder)   . Pregnancy   . Reactive airway disease   . Reflux   . Restless legs   . URI (upper respiratory infection)   . Vaginal Pap smear, abnormal    Past Surgical History:  Procedure Laterality Date  . NO PAST SURGERIES     OB History  Gravida Para Term Preterm AB Living  1 1 1     1   SAB TAB Ectopic Multiple Live Births          1    # Outcome Date GA Lbr Len/2nd Weight Sex Delivery Anes PTL Lv  1 Term 09/08/12 [redacted]w[redacted]d 23:15 / 04:37 2815 g M Vag-Vacuum EPI  LIV  Patient denies any other pertinent gynecologic issues.   No current facility-administered medications on file prior to encounter.   Current Outpatient Medications on File Prior to Encounter  Medication Sig Dispense Refill  . amphetamine-dextroamphetamine (ADDERALL) 20 MG tablet Take one po qam, then 1/2 tablet 4 hours later. May take an additional 1/2 tablet another 4 hours later prn. (Patient taking differently: Take 10-20 mg by mouth. Take 20 mg by mouth in the morning, then 10 mg 4 hours later and  an additional 10 mg another 4 hours later as needed) 60 tablet 0  .  clonazePAM (KLONOPIN) 0.5 MG tablet TAKE ONE-HALF TO ONE TABLET BY MOUTH TWICE A DAY AS NEEDED FOR ANXIETY (Patient taking differently: Take 0.25-0.5 mg by mouth 2 (two) times daily as needed for anxiety. ) 30 tablet 3  . etonogestrel (NEXPLANON) 68 MG IMPL implant 68 mg by Subdermal route once.     [redacted]w[redacted]d FLUoxetine (PROZAC) 40 MG capsule TAKE ONE CAPSULE (40MG  TOTAL) BY MOUTH DAILY (Patient taking differently: Take 40 mg by mouth 2 (two) times daily. ) 30 capsule 3  . valACYclovir (VALTREX) 1000 MG tablet TAKE ONE TABLET ONCE DAILY (Patient not taking: No sig reported) 30 tablet 6   Allergies  Allergen Reactions  . Abilify [Aripiprazole]     Restless legs  . Celexa [Citalopram Hydrobromide]     Agitation   . Clindamycin/Lincomycin   . Penicillins Other (See Comments)    Unknown Childhood reaction  . Zoloft [Sertraline Hcl]     Suicidal thoughts  . Amoxil [Amoxicillin] Rash    Social History:   reports that she has been smoking cigarettes. She has a 2.50 pack-year smoking history. She has never used smokeless tobacco. She reports that she does not drink alcohol or use drugs.  Family History  Problem Relation Age of Onset  . Depression Mother   . Bipolar disorder Sister   . Drug  abuse Paternal Aunt   . Cancer Paternal Grandmother   . Hypertension Paternal Grandmother   . Diabetes Paternal Grandmother   . Arthritis Paternal Grandmother   . Heart disease Paternal Grandmother     Review of Systems: Noncontributory  PHYSICAL EXAM: There were no vitals taken for this visit. General appearance - alert, well appearing, and in no distress Chest - clear to auscultation, no wheezes, rales or rhonchi, symmetric air entry Heart - normal rate and regular rhythm Abdomen - soft, nontender, nondistended, no masses or organomegaly                     No masses. Pelvic -  Adequate support. Multiparous cervix with everted cervix.               Uterus normal size  Shape. Extremities - peripheral  pulses normal, no pedal edema, no clubbing or cyanosis  Labs: Results for orders placed or performed during the hospital encounter of 09/20/19 (from the past 336 hour(s))  SARS CORONAVIRUS 2 (TAT 6-24 HRS) Nasopharyngeal Nasopharyngeal Swab   Collection Time: 09/20/19  6:43 AM   Specimen: Nasopharyngeal Swab  Result Value Ref Range   SARS Coronavirus 2 NEGATIVE NEGATIVE  Results for orders placed or performed during the hospital encounter of 09/16/19 (from the past 336 hour(s))  Basic metabolic panel   Collection Time: 09/16/19 11:02 AM  Result Value Ref Range   Sodium 139 135 - 145 mmol/L   Potassium 3.7 3.5 - 5.1 mmol/L   Chloride 108 98 - 111 mmol/L   CO2 21 (L) 22 - 32 mmol/L   Glucose, Bld 101 (H) 70 - 99 mg/dL   BUN 18 6 - 20 mg/dL   Creatinine, Ser 0.59 0.44 - 1.00 mg/dL   Calcium 8.6 (L) 8.9 - 10.3 mg/dL   GFR calc non Af Amer >60 >60 mL/min   GFR calc Af Amer >60 >60 mL/min   Anion gap 10 5 - 15  CBC   Collection Time: 09/16/19 11:02 AM  Result Value Ref Range   WBC 9.4 4.0 - 10.5 K/uL   RBC 4.47 3.87 - 5.11 MIL/uL   Hemoglobin 13.2 12.0 - 15.0 g/dL   HCT 40.9 36.0 - 46.0 %   MCV 91.5 80.0 - 100.0 fL   MCH 29.5 26.0 - 34.0 pg   MCHC 32.3 30.0 - 36.0 g/dL   RDW 13.2 11.5 - 15.5 %   Platelets 228 150 - 400 K/uL   nRBC 0.0 0.0 - 0.2 %  hCG, serum, qualitative   Collection Time: 09/16/19 11:02 AM  Result Value Ref Range   Preg, Serum NEGATIVE NEGATIVE    Imaging Studies: No results found.  Assessment: Patient Active Problem List   Diagnosis Date Noted  . Periodic limb movements of sleep 08/07/2019  . Furuncle of pubic region 07/07/2019  . Episodic tension-type headache, not intractable 04/30/2019  . ADD (attention deficit disorder) 03/26/2013  . Generalized anxiety disorder 11/13/2012  . Genital herpes 11/13/2012  . Nexplanon insertion 10/27/2012    Plan: Patient will undergo surgical management with Cold Knife Conization.   Jonnie Kind,  MD  09/21/2019 7:09 AM

## 2019-09-21 NOTE — Anesthesia Procedure Notes (Signed)
Procedure Name: LMA Insertion Date/Time: 09/21/2019 11:43 AM Performed by: Moshe Salisbury, CRNA Pre-anesthesia Checklist: Patient identified, Patient being monitored, Emergency Drugs available, Timeout performed and Suction available Patient Re-evaluated:Patient Re-evaluated prior to induction Oxygen Delivery Method: Circle System Utilized Preoxygenation: Pre-oxygenation with 100% oxygen Induction Type: IV induction Ventilation: Mask ventilation without difficulty LMA: LMA inserted LMA Size: 4.0 Number of attempts: 1 Placement Confirmation: positive ETCO2 and breath sounds checked- equal and bilateral

## 2019-09-21 NOTE — Transfer of Care (Signed)
Immediate Anesthesia Transfer of Care Note  Patient: Tracy Flores  Procedure(s) Performed: COLD KNIFE CONIZATION CERVIX WITH BIOPSY (N/A Cervix)  Patient Location: PACU  Anesthesia Type:General  Level of Consciousness: awake  Airway & Oxygen Therapy: Patient Spontanous Breathing  Post-op Assessment: Report given to RN  Post vital signs: Reviewed and stable  Last Vitals:  Vitals Value Taken Time  BP 121/57 09/21/19 1226  Temp    Pulse 83 09/21/19 1230  Resp 24 09/21/19 1230  SpO2 96 % 09/21/19 1230  Vitals shown include unvalidated device data.  Last Pain:  Vitals:   09/21/19 1120  TempSrc: Oral  PainSc: 0-No pain         Complications: No apparent anesthesia complications

## 2019-09-21 NOTE — Anesthesia Postprocedure Evaluation (Signed)
Anesthesia Post Note  Patient: Tracy Flores  Procedure(s) Performed: COLD KNIFE CONIZATION CERVIX WITH BIOPSY (N/A Cervix)  Patient location during evaluation: PACU Anesthesia Type: General Level of consciousness: awake and alert and oriented Pain management: pain level controlled Vital Signs Assessment: post-procedure vital signs reviewed and stable Respiratory status: spontaneous breathing Cardiovascular status: blood pressure returned to baseline and stable Postop Assessment: no apparent nausea or vomiting Anesthetic complications: no     Last Vitals:  Vitals:   09/21/19 1315 09/21/19 1324  BP: 115/66 98/61  Pulse: (!) 57 64  Resp: 12 18  Temp:  36.8 C  SpO2: 96% 94%    Last Pain:  Vitals:   09/21/19 1324  TempSrc: Oral  PainSc: 0-No pain                 Cashel Bellina

## 2019-09-21 NOTE — Discharge Instructions (Signed)
No heavy lifting x1 week may need to be out of work for that week You may call the office at any time for questions there is an after hours call a nurse service they can get in touch with me if needed   Cervical Conization, Care After This sheet gives you information about how to care for yourself after your procedure. Your doctor may also give you more specific instructions. If you have problems or questions, contact your doctor. What can I expect after the procedure? After the procedure, it is common to have:  A groggy feeling, if you were given medicine to make you fall asleep (general anesthetic).  Cramps that feel like menstrual cramps.  Bloody discharge or light to moderate bleeding.  Dark fluid from the vagina (vaginal discharge). This fluid may look similar to coffee grounds. Follow these instructions at home: Medicines  Take over-the-counter and prescription medicines only as told by your doctor.  Do not take aspirin until your doctor says it is okay. Activity   Rest as told by your doctor.  For 7-14 days after your procedure, avoid: ? Being very active. ? Exercising. ? Heavy lifting.  Do not lift anything that is heavier than 10 lb (4.5 kg), or the limit that you are told, until your doctor says that it is safe.  Return to your normal activities as told by your doctor. Ask your doctor what activities are safe for you. General instructions   You may eat your normal diet unless your doctor tells you not to do so.  Drink enough fluid to keep your pee (urine) pale yellow.  Do not take baths, swim, or use a hot tub until your doctor approves. Ask your doctor if you may take showers. You may only be allowed to take sponge baths.  Do not douche, have sex, or put anything in the vagina, including tampons, until your doctor says it is okay.  Keep all follow-up visits as told by your doctor. This is important. Contact a doctor if:  You get a rash.  You are dizzy or  light-headed.  You feel like you may vomit (nauseous).  You vomit.  You have fluid from your vagina that smells bad. Get help right away if:  There are bloody clumps (clots) coming from your vagina.  You have more bleeding than you would have in a normal menstrual period. For example, you soak a pad in less than 1 hour.  You have a fever.  You have more and more cramps.  You pass out (faint).  You have pain when peeing.  You have very bad pain, or your pain gets worse. Medicine does not help your pain.  You have blood in your pee. Summary  After the procedure, it is common to have cramps, some bleeding, and dark or bloody fluid from your vagina.  Do not douche, have sex, or use tampons until your doctor says it is okay.  For about 7-14 days after your procedure, try not to exercise or lift heavy objects. This information is not intended to replace advice given to you by your health care provider. Make sure you discuss any questions you have with your health care provider. Document Revised: 12/14/2018 Document Reviewed: 12/14/2018 Elsevier Patient Education  2020 [employer].

## 2019-09-21 NOTE — Brief Op Note (Addendum)
09/21/2019  12:28 PM  PATIENT:  Tracy Flores  29 y.o. 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  PROCEDURE:  Procedure(s): COLD KNIFE CONIZATION CERVIX WITH BIOPSY (N/A)  SURGEON:  Surgeon(s) and Role:    Tilda Burrow, MD - Primary  PHYSICIAN ASSISTANT:   ASSISTANTS: Trenton Founds, CST, Kylie____RNFA  ---  ANESTHESIA:   general and paracervical block  EBL:  20 mL   BLOOD ADMINISTERED:none  DRAINS: none   LOCAL MEDICATIONS USED:  MARCAINE    and Amount: 10 ml  SPECIMEN:  Source of Specimen:  Cervical conization specimen  DISPOSITION OF SPECIMEN:  PATHOLOGY  COUNTS:  YES  TOURNIQUET:  * No tourniquets in log *  DICTATION: .Dragon Dictation  PLAN OF CARE: Discharge to home after PACU  PATIENT DISPOSITION:  PACU - hemodynamically stable.   Delay start of Pharmacological VTE agent (>24hrs) due to surgical blood loss or risk of bleeding: not applicable Details of procedure: Patient was taken the operating room prepped and draped for lower abdominal and vaginal surgery with legs in candycane support.  Timeout was conducted and procedure confirmed by operative team.  Weighted speculum was placed in the posterior vagina, Lugol solution applied to the cervix identifying the areas that did not take up to Lugol solution.  A 2 cm wide thin wafer of conization specimen was then removed.  We began by placing a 0 chromic suture at 3:00 and 9:00 to reduce perfusion of the cervix followed by paracervical block using Marcaine 10 cc then a #15 blade was used to circumscribe the cervix outside the area that did not take up Lugol's.  This thin wafer was then excised intact and Monsel's applied to the surface with good hemostasis this was tagged at 12:00 and sent for histology.  Sponge and needle counts were correct

## 2019-09-22 ENCOUNTER — Telehealth: Payer: Self-pay | Admitting: Obstetrics and Gynecology

## 2019-09-22 LAB — SURGICAL PATHOLOGY

## 2019-09-22 NOTE — Telephone Encounter (Signed)
I informed Tracy Flores that the pathology report showed severe dysplasia, no invasive cancer, and that the severe dysplasia extended to the inner edge of the biopsy sample  My recommendations are that she continue the MetroGel, plan to heal 4 to 6 weeks, then repeat colposcopy with plans for either a small LEEP in the office or a deeper excision through day surgery

## 2019-09-30 ENCOUNTER — Other Ambulatory Visit: Payer: Self-pay

## 2019-09-30 ENCOUNTER — Encounter: Payer: Self-pay | Admitting: Obstetrics and Gynecology

## 2019-09-30 ENCOUNTER — Ambulatory Visit (INDEPENDENT_AMBULATORY_CARE_PROVIDER_SITE_OTHER): Payer: Medicaid Other | Admitting: Obstetrics and Gynecology

## 2019-09-30 VITALS — BP 111/67 | HR 73 | Ht 67.0 in | Wt 176.8 lb

## 2019-09-30 DIAGNOSIS — N871 Moderate cervical dysplasia: Secondary | ICD-10-CM

## 2019-09-30 DIAGNOSIS — Z09 Encounter for follow-up examination after completed treatment for conditions other than malignant neoplasm: Secondary | ICD-10-CM

## 2019-09-30 NOTE — Progress Notes (Signed)
Patient ID: Tracy Flores, female   DOB: Jan 15, 1991, 29 y.o.   MRN: [id number]     Subjective:  Tracy Flores is a 29 y.o. female now 1 weeks status post cold knife conization cervix with biopsy.   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 High-grade dysplasia involves the endocervical margin, The current plan is REPEAT COLPOSCOPY 6-10 WEEKS AFTER CKC.  Review of Systems Negative    Diet:   normal   Bowel movements : normal.  The patient is not having any pain.  Objective:  There were no vitals taken for this visit. General:Well developed, well nourished.  No acute distress. Abdomen: Bowel sounds normal, soft, non-tender. Pelvic Exam:    External Genitalia:  Normal.    Vagina: Normal    Cervix: Normal healing , with minimal erythema.     Uterus: Normal    Adnexa/Bimanual: Not checked  Incision(s): Healing well, no drainage, no erythema, no hernia, no swelling, no dehiscence     Assessment:  Post-Op 1 weeks s/p cold knife conization cervix with biopsy  Involved endocervical margin on Conization specimen 2 wk visit, stable postoperatively.   Plan:  1.Wound care discussed   2. Current medications. 3. Activity restrictions: sexual activity for 10 more days 4. return to work: now. 5. Follow up in 6 weeks for colposcopy   By signing my name below, I, [employer], attest that this documentation has been prepared under the direction and in the presence of Tilda Burrow, MD. Electronically Signed: Mal Misty Medical Scribe. 09/30/19. 8:30 AM.  I personally performed the services described in this documentation, which was SCRIBED in my presence. The recorded information has been reviewed and considered accurate. It has been edited as necessary during review. Tilda Burrow, MD

## 2019-10-29 ENCOUNTER — Telehealth: Payer: Self-pay | Admitting: Family Medicine

## 2019-10-29 NOTE — Telephone Encounter (Signed)
Vilma wanting to know the name of the mental health place that Elsah suggested.  She thinks it's [facility] but doesn't know any information on this.

## 2019-10-30 NOTE — Telephone Encounter (Signed)
Yes. Recommend [facility] in [place]. Thanks.

## 2019-11-01 NOTE — Telephone Encounter (Signed)
Patient grandmother notified of [name]'s recommendation. She wanted to make an appointment to see [facility] Friday.

## 2019-11-05 ENCOUNTER — Other Ambulatory Visit: Payer: Self-pay

## 2019-11-05 ENCOUNTER — Encounter: Payer: Self-pay | Admitting: Nurse Practitioner

## 2019-11-05 ENCOUNTER — Ambulatory Visit (INDEPENDENT_AMBULATORY_CARE_PROVIDER_SITE_OTHER): Payer: Medicaid Other | Admitting: Nurse Practitioner

## 2019-11-05 VITALS — BP 120/72 | Temp 97.5°F | Wt 175.4 lb

## 2019-11-05 DIAGNOSIS — F411 Generalized anxiety disorder: Secondary | ICD-10-CM | POA: Diagnosis not present

## 2019-11-05 MED ORDER — TRAZODONE HCL 50 MG PO TABS: 25.0000 mg | ORAL_TABLET | Freq: Every evening | ORAL | 0 refills | Status: DC | PRN

## 2019-11-05 NOTE — Progress Notes (Signed)
   Subjective:    Patient ID: Tracy Flores, female    DOB: 1991/04/23, 29 y.o.   MRN: [id number]  HPI Patient arrives to discuss anger issues and insomnia. Recently quit her job which has greatly helped part of her stress but now dealing with finances.  Also dealing with stress of procedures for cervical dysplasia.  Prozac does not seem to be helping her anger issues and insomnia. Denies suicidal or homicidal thoughts/ideation. Denies any self harm. Plans to go to [facility] in [place] this week for mental health consultation. Has already looked at their website.    Review of Systems     Objective:   Physical Exam NAD. Alert, oriented. Calm, cheerful affect. Makes good eye contact. Dressed appropriately. Thoughts logical, coherent and relevant.  Lungs clear. Heart RRR.     Assessment & Plan:   Problem List Items Addressed This Visit      Other   Generalized anxiety disorder - Primary   Relevant Medications   traZODone (DESYREL) 50 MG tablet     Meds ordered this encounter  Medications  . traZODone (DESYREL) 50 MG tablet    Sig: Take 0.5-1 tablets (25-50 mg total) by mouth at bedtime as needed for sleep.    Dispense:  30 tablet    Refill:  0    Order Specific Question:   Supervising Provider    Answer:   Lilyan Punt A [[id number]]   Continue Prozac until her consultation next week. Added low dose Trazodone for sleep. DC med if any adverse effects. Recheck here as needed.

## 2019-11-06 ENCOUNTER — Encounter: Payer: Self-pay | Admitting: Nurse Practitioner

## 2019-11-08 ENCOUNTER — Telehealth: Payer: Self-pay | Admitting: Family Medicine

## 2019-11-08 MED ORDER — HYDROXYZINE PAMOATE 25 MG PO CAPS: 25.0000 mg | ORAL_CAPSULE | Freq: Every evening | ORAL | 2 refills | Status: DC | PRN

## 2019-11-08 NOTE — Telephone Encounter (Signed)
Nurses- discontinue trazodone because of interaction May use Vistaril 25 mg 1 nightly as needed insomnia, #30, 3 refills May send patient my chart message letting her know about this change and why Encourage patient to do a follow-up with [place] later in the summer sooner if problems

## 2019-11-08 NOTE — Telephone Encounter (Signed)
Prescription sent electronically to pharmacy. Pharmacy notified and stated the will explain the change in medication with the patient when she picks it up.

## 2019-11-08 NOTE — Telephone Encounter (Signed)
Pharmacy calling to verify if it is ok to fill traZODone (DESYREL) 50 MG tablet  It is coming up as an interaction with FLUoxetine (PROZAC) 40 MG capsule

## 2019-11-09 NOTE — Progress Notes (Signed)
PATIENT ID: Tracy Flores, female     DOB: [birth year], 29 y.o.     MRN: [id number]   Tracy Flores 29 y.o. [id number] here for colposcopy for high-grade squamous intraepithelial neoplasia  (HGSIL-encompassing moderate and severe dysplasia) pap smear on 07/07/2019. CKC done with cin II- III and apparent endocervical involvement on a generous cone.  Discussed role for HPV in cervical dysplasia, need for surveillance.  Patient given informed consent, signed copy in the chart, time out was performed.  Placed in lithotomy position. Cervix viewed with speculum and colposcope after application of acetic acid.   Colposcopy adequate? Yes  acetowhite lesion(s) noted at 11 o'clock; biopsies obtained at 11.   ECC specimen obtained.no could see well in endocervix, and brush sample collected. All specimens were labelled and sent to pathology.   Colposcopy IMPRESSION residual CIN II-III at 11 oclock:  Patient was given post procedure instructions. Will follow up pathology and manage accordingly.  Routine preventative health maintenance measures emphasized. IF CONFIRMED, WILL NEED LASER CONE.   By signing my name below, I, [employer], attest that this documentation has been prepared under the direction and in the presence of Tilda Burrow, MD. Electronically Signed: Mal Misty Medical Scribe. 11/09/19. 4:21 PM.  .I personally performed the services described in this documentation, which was SCRIBED in my presence. The recorded information has been reviewed and considered accurate. It has been edited as necessary during review. Tilda Burrow, MD

## 2019-11-11 ENCOUNTER — Ambulatory Visit (INDEPENDENT_AMBULATORY_CARE_PROVIDER_SITE_OTHER): Payer: Medicaid Other | Admitting: Obstetrics and Gynecology

## 2019-11-11 ENCOUNTER — Other Ambulatory Visit (HOSPITAL_COMMUNITY)
Admission: RE | Admit: 2019-11-11 | Discharge: 2019-11-11 | Disposition: A | Discharge status: Home / Self Care | Payer: Medicaid Other | Source: Ambulatory Visit | Attending: Obstetrics and Gynecology | Admitting: Obstetrics and Gynecology

## 2019-11-11 ENCOUNTER — Encounter: Payer: Self-pay | Admitting: Obstetrics and Gynecology

## 2019-11-11 ENCOUNTER — Other Ambulatory Visit: Payer: Self-pay | Admitting: Obstetrics and Gynecology

## 2019-11-11 VITALS — Ht 67.0 in | Wt 175.6 lb

## 2019-11-11 DIAGNOSIS — Z3202 Encounter for pregnancy test, result negative: Secondary | ICD-10-CM | POA: Diagnosis not present

## 2019-11-11 DIAGNOSIS — Z124 Encounter for screening for malignant neoplasm of cervix: Secondary | ICD-10-CM | POA: Insufficient documentation

## 2019-11-11 DIAGNOSIS — N870 Mild cervical dysplasia: Secondary | ICD-10-CM | POA: Diagnosis not present

## 2019-11-11 DIAGNOSIS — D069 Carcinoma in situ of cervix, unspecified: Secondary | ICD-10-CM

## 2019-11-11 LAB — POCT URINE PREGNANCY: Preg Test, Ur: NEGATIVE

## 2019-11-16 LAB — CYTOLOGY - PAP
Chlamydia: NEGATIVE
Comment: NEGATIVE
Comment: NEGATIVE
Comment: NORMAL
Diagnosis: NEGATIVE
High risk HPV: NEGATIVE
Neisseria Gonorrhea: NEGATIVE

## 2019-11-18 NOTE — Progress Notes (Signed)
Low grade cervical abnormalities under age 30 with simultaneous repeat of pap that returns normal.  Per ASCCP guidelines, treatment can be repeat Colposcopy and HPV testing in 1 yr, or other acceptable treatment would be excisional procedure such as LEEP.

## 2019-12-21 ENCOUNTER — Ambulatory Visit
Admission: EM | Admit: 2019-12-21 | Discharge: 2019-12-21 | Disposition: A | Discharge status: Home / Self Care | Payer: Medicaid Other | Attending: Emergency Medicine | Admitting: Emergency Medicine

## 2019-12-21 DIAGNOSIS — G43809 Other migraine, not intractable, without status migrainosus: Secondary | ICD-10-CM | POA: Diagnosis not present

## 2019-12-21 MED ORDER — SUMATRIPTAN SUCCINATE 6 MG/0.5ML ~~LOC~~ SOLN
6.0000 mg | Freq: Once | SUBCUTANEOUS | Status: AC
  Administered 2019-12-21: 6 mg via SUBCUTANEOUS

## 2019-12-21 MED ORDER — ONDANSETRON 4 MG PO TBDP
4.0000 mg | ORAL_TABLET | Freq: Once | ORAL | Status: AC
  Administered 2019-12-21: 4 mg via ORAL

## 2019-12-21 MED ORDER — DEXAMETHASONE SODIUM PHOSPHATE 10 MG/ML IJ SOLN
10.0000 mg | Freq: Once | INTRAMUSCULAR | Status: AC
  Administered 2019-12-21: 10 mg via INTRAMUSCULAR

## 2019-12-21 MED ORDER — KETOROLAC TROMETHAMINE 30 MG/ML IJ SOLN
30.0000 mg | Freq: Once | INTRAMUSCULAR | Status: AC
  Administered 2019-12-21: 30 mg via INTRAMUSCULAR

## 2019-12-21 NOTE — ED Provider Notes (Signed)
[facility]   [id number] 12/21/19 Arrival Time: 1238  CC: HEADACHE  SUBJECTIVE:  Tracy Flores is a 29 y.o. female who complains of migraine x 3 days.  Denies a precipitating event, or recent head trauma.  Patient localizes her pain to the back LT side of head.  Describes the pain as constant and throbbing in character.  Patient has tried imitrex without relief. Symptoms are made worse with light and noise.  Reports similar symptoms in the past that improved with imitrex.  This is not the worst headache of their life.  Complains of RT shoulder numbness.  Patient denies fever, chills, nausea, vomiting, aura, rhinorrhea, watery eyes, chest pain, SOB, abdominal pain, weakness, tingling, slurred speech.    ROS: As per HPI.  All other pertinent ROS negative.     Past Medical History:  Diagnosis Date  . Abnormal pap   . Acid reflux disease   . Anxiety   . History of gonorrhea 2013  . 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 IBS (irritable bowel syndrome)   . Interstitial cystitis   . Major depression   . OCD (obsessive compulsive disorder)   . Pregnancy   . Reactive airway disease   . Reflux   . Restless legs   . URI (upper respiratory infection)   . Vaginal Pap smear, abnormal    Past Surgical History:  Procedure Laterality Date  . CERVICAL CONIZATION W/BX N/A 09/21/2019   Procedure: COLD KNIFE CONIZATION CERVIX WITH BIOPSY;  Surgeon: Tilda Burrow, MD;  Location: [facility];  Service: Gynecology;  Laterality: N/A;  . NO PAST SURGERIES     Allergies  Allergen Reactions  . Abilify [Aripiprazole]     Restless legs  . Celexa [Citalopram Hydrobromide]     Agitation   . Clindamycin/Lincomycin   . Penicillins Other (See Comments)    Unknown Childhood reaction  . Zoloft [Sertraline Hcl]     Suicidal thoughts  . Amoxil [Amoxicillin] Rash   No current facility-administered medications on file prior to encounter.   Current Outpatient Medications on File  Prior to Encounter  Medication Sig Dispense Refill  . amphetamine-dextroamphetamine (ADDERALL) 20 MG tablet Take one po qam, then 1/2 tablet 4 hours later. May take an additional 1/2 tablet another 4 hours later prn. (Patient taking differently: Take 10-20 mg by mouth. Take 20 mg by mouth in the morning, then 10 mg 4 hours later and  an additional 10 mg another 4 hours later as needed) 60 tablet 0  . clonazePAM (KLONOPIN) 0.5 MG tablet TAKE ONE-HALF TO ONE TABLET BY MOUTH TWICE A DAY AS NEEDED FOR ANXIETY (Patient taking differently: Take 0.25-0.5 mg by mouth 2 (two) times daily as needed for anxiety. ) 30 tablet 3  . etonogestrel (NEXPLANON) 68 MG IMPL implant 68 mg by Subdermal route once.     Marland Kitchen FLUoxetine (PROZAC) 40 MG capsule TAKE ONE CAPSULE (40MG  TOTAL) BY MOUTH DAILY (Patient taking differently: Take 40 mg by mouth 2 (two) times daily. ) 30 capsule 3  . hydrOXYzine (VISTARIL) 25 MG capsule Take 1 capsule (25 mg total) by mouth at bedtime as needed (imsomnia). 30 capsule 2  . SUMAtriptan (IMITREX) 50 MG tablet Take one tablet po at first sign of headache, may repeat 2 hrs later (Patient taking differently: Take 50 mg by mouth every 2 (two) hours as needed for migraine. ) 12 tablet 2  . valACYclovir (VALTREX) 1000 MG tablet TAKE ONE TABLET ONCE DAILY 30 tablet  6   Social History   Socioeconomic History  . Marital status: Single    Spouse name: Not on file  . Number of children: 1  . Years of education: in college  . Highest education level: Not on file  Occupational History  . Occupation: Lexicographer: UNEMPLOYED  Tobacco Use  . Smoking status: Current Every Day Smoker    Packs/day: 0.50    Years: 5.00    Pack years: 2.50    Types: Cigarettes  . Smokeless tobacco: Never Used  Vaping Use  . Vaping Use: Never used  Substance and Sexual Activity  . Alcohol use: No  . Drug use: No  . Sexual activity: Yes    Birth control/protection: Implant  Other Topics Concern  . Not on  file  Social History Narrative  . Not on file   Social Determinants of Health   Financial Resource Strain:   . Difficulty of Paying Living Expenses:   Food Insecurity:   . Worried About Charity fundraiser in the Last Year:   . Arboriculturist in the Last Year:   Transportation Needs:   . Film/video editor (Medical):   Marland Kitchen Lack of Transportation (Non-Medical):   Physical Activity:   . Days of Exercise per Week:   . Minutes of Exercise per Session:   Stress:   . Feeling of Stress :   Social Connections:   . Frequency of Communication with Friends and Family:   . Frequency of Social Gatherings with Friends and Family:   . Attends Religious Services:   . Active Member of Clubs or Organizations:   . Attends Archivist Meetings:   Marland Kitchen Marital Status:   Intimate Partner Violence:   . Fear of Current or Ex-Partner:   . Emotionally Abused:   Marland Kitchen Physically Abused:   . Sexually Abused:    Family History  Problem Relation Age of Onset  . Depression Mother   . Bipolar disorder Sister   . Drug abuse Paternal Aunt   . Cancer Paternal Grandmother   . Hypertension Paternal Grandmother   . Diabetes Paternal Grandmother   . Arthritis Paternal Grandmother   . Heart disease Paternal Grandmother     OBJECTIVE:  Vitals:   12/21/19 1255  BP: 109/71  Pulse: 79  Resp: 18  Temp: 98.2 F (36.8 C)  SpO2: 96%    General appearance: alert; no distress Eyes: PERRLA; EOMI HENT: normocephalic; atraumatic Neck: supple with FROM Lungs: clear to auscultation bilaterally Heart: regular rate and rhythm.   Extremities: no edema; symmetrical with no gross deformities Skin: warm and dry Neurologic: CN 2-12 grossly intact; finger to nose without difficulty; normal gait; strength and sensation intact bilaterally about the upper and lower extremities; negative pronator drift Psychological: alert and cooperative; normal mood and affect   ASSESSMENT & PLAN:  1. Other migraine without  status migrainosus, not intractable     Meds ordered this encounter  Medications  . ketorolac (TORADOL) 30 MG/ML injection 30 mg  . dexamethasone (DECADRON) injection 10 mg  . SUMAtriptan (IMITREX) injection 6 mg  . ondansetron (ZOFRAN-ODT) disintegrating tablet 4 mg    Migraine cocktail given in office Rest and drink plenty of fluids Use OTC medications as needed for symptomatic relief Follow up with PCP if symptoms persists Return or go to the ER if you have any new or worsening symptoms such as fever, chills, nausea, vomiting, chest pain, shortness of breath, cough, vision changes,  worsening headache despite treatment, slurred speech, facial asymmetry, weakness in arms or legs, etc...  Reviewed expectations re: course of current medical issues. Questions answered. Outlined signs and symptoms indicating need for more acute intervention. Patient verbalized understanding. After Visit Summary given.   Rennis Harding, PA-C 12/21/19 1402

## 2019-12-21 NOTE — Discharge Instructions (Addendum)
Migraine cocktail given in office Rest and drink plenty of fluids Use OTC medications as needed for symptomatic relief Follow up with PCP if symptoms persists Return or go to the ER if you have any new or worsening symptoms such as fever, chills, nausea, vomiting, chest pain, shortness of breath, cough, vision changes, worsening headache despite treatment, slurred speech, facial asymmetry, weakness in arms or legs, etc... 

## 2019-12-21 NOTE — ED Triage Notes (Signed)
Pt reports having margarine for past 3 days, has taken Imitrex without relief, was told by PCP to come here

## 2019-12-22 ENCOUNTER — Other Ambulatory Visit: Payer: Self-pay

## 2019-12-22 ENCOUNTER — Telehealth: Payer: Self-pay | Admitting: Family Medicine

## 2019-12-22 ENCOUNTER — Emergency Department (HOSPITAL_COMMUNITY)
Admission: EM | Admit: 2019-12-22 | Discharge: 2019-12-22 | Disposition: A | Discharge status: Home / Self Care | Payer: Medicaid Other | Attending: Emergency Medicine | Admitting: Emergency Medicine

## 2019-12-22 ENCOUNTER — Encounter (HOSPITAL_COMMUNITY): Payer: Self-pay

## 2019-12-22 DIAGNOSIS — Z79899 Other long term (current) drug therapy: Secondary | ICD-10-CM | POA: Insufficient documentation

## 2019-12-22 DIAGNOSIS — F909 Attention-deficit hyperactivity disorder, unspecified type: Secondary | ICD-10-CM | POA: Diagnosis not present

## 2019-12-22 DIAGNOSIS — G43009 Migraine without aura, not intractable, without status migrainosus: Secondary | ICD-10-CM | POA: Diagnosis not present

## 2019-12-22 DIAGNOSIS — F17210 Nicotine dependence, cigarettes, uncomplicated: Secondary | ICD-10-CM | POA: Diagnosis not present

## 2019-12-22 DIAGNOSIS — R519 Headache, unspecified: Secondary | ICD-10-CM | POA: Diagnosis present

## 2019-12-22 MED ORDER — SODIUM CHLORIDE 0.9 % IV BOLUS
1000.0000 mL | Freq: Once | INTRAVENOUS | Status: AC
  Administered 2019-12-22: 1000 mL via INTRAVENOUS

## 2019-12-22 MED ORDER — DEXAMETHASONE SODIUM PHOSPHATE 4 MG/ML IJ SOLN
4.0000 mg | Freq: Once | INTRAMUSCULAR | Status: AC
  Administered 2019-12-22: 4 mg via INTRAVENOUS
  Filled 2019-12-22: qty 1

## 2019-12-22 MED ORDER — METOCLOPRAMIDE HCL 5 MG/ML IJ SOLN
10.0000 mg | Freq: Once | INTRAMUSCULAR | Status: AC
  Administered 2019-12-22: 10 mg via INTRAVENOUS
  Filled 2019-12-22: qty 2

## 2019-12-22 MED ORDER — DIPHENHYDRAMINE HCL 50 MG/ML IJ SOLN
50.0000 mg | Freq: Once | INTRAMUSCULAR | Status: AC
  Administered 2019-12-22: 50 mg via INTRAVENOUS
  Filled 2019-12-22: qty 1

## 2019-12-22 NOTE — Telephone Encounter (Signed)
Spoke to grandmother who is going to relay message to pt -- if unable to move arm at all she needs to go to ER otherwise we will see her in the office at 11:30 tomorrow.

## 2019-12-22 NOTE — Telephone Encounter (Signed)
Tracy Flores- Grandmother calling states pt has been in the bed for the last 5 days other than going to urgent care and er and they only addressed migraines. Right arm has been hurting since migraine started states it feels like its being pulled off by the shoulder and going numb down to her hand.

## 2019-12-22 NOTE — Telephone Encounter (Signed)
I did speak with Tracy Flores If the patient is having motor weakness or inability to move the arm she should go back to the ER If it is more pain down the arm but can move the arm then follow-up tomorrow No additional appointments available for today

## 2019-12-22 NOTE — Telephone Encounter (Signed)
Patient called yesterday for right-arm pain and was told to go to urgent care and she did but they only treated her for migraine symptoms and not her arm. So later she want to [facility] ER at 2 am with the same problem with arm pain but they didn't do x-ray on it only treated her for migraine again notes are in the system . Patient is requesting x-ray of her right arm please. She states in so much pain. Please advise

## 2019-12-22 NOTE — ED Triage Notes (Signed)
Pt reports history of migraines.  Reports has had  Migraine x 4 days.  Pt says r arm has felt numb and tingly for the past week.  Reports nausea, no vomiting.  Pt went to [facility] urgent care yesterday and says was given a migraine cocktail but it hasn't helped.

## 2019-12-22 NOTE — Telephone Encounter (Signed)
Please call Mersadez at [phone]

## 2019-12-22 NOTE — ED Provider Notes (Signed)
[facility] EMERGENCY DEPARTMENT Provider Note   CSN: [id number] Arrival date & time: 12/22/19  3295     History Chief Complaint  Patient presents with  . Headache    Tracy Flores is a 29 y.o. female who presents to the ED today with complaint of gradual onset, constant, unchanged, throbbing, left occipital headache x 4 days. Pt also complains of nausea, photophobia, and phonophobia. She has hx of migraine HA and states this feels similar. Denies worst HA of life. Pt was seen at [facility] yesterday and received toradol, decadron, and imitrex IM with very mild relief of her headache however it is still persistent prompting her to come to the ED today. She states her headaches typically only last about 2 days and have never lasted this long causing her concern. Pt denies fevers, chills, vomiting, neck stiffness, rash, confusion, unilateral weakness/numbness, or any other associated symptoms.   The history is provided by the patient and medical records.       Past Medical History:  Diagnosis Date  . Abnormal pap   . Acid reflux disease   . Anxiety   . History of gonorrhea 2013  . 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 IBS (irritable bowel syndrome)   . Interstitial cystitis   . Major depression   . OCD (obsessive compulsive disorder)   . Pregnancy   . Reactive airway disease   . Reflux   . Restless legs   . URI (upper respiratory infection)   . Vaginal Pap smear, abnormal     Patient Active Problem List   Diagnosis Date Noted  . 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 Periodic limb movements of sleep 08/07/2019  . Furuncle of pubic region 07/07/2019  . Episodic tension-type headache, not intractable 04/30/2019  . ADD (attention deficit disorder) 03/26/2013  . Generalized anxiety disorder 11/13/2012  . Genital herpes 11/13/2012  . Nexplanon insertion 10/27/2012    Past Surgical History:  Procedure Laterality Date  . CERVICAL CONIZATION W/BX N/A  09/21/2019   Procedure: COLD KNIFE CONIZATION CERVIX WITH BIOPSY;  Surgeon: Tilda Burrow, MD;  Location: [facility];  Service: Gynecology;  Laterality: N/A;  . NO PAST SURGERIES       OB History    Gravida  1   Para  1   Term  1   Preterm      AB      Living  1     SAB      TAB      Ectopic      Multiple      Live Births  1           Family History  Problem Relation Age of Onset  . Depression Mother   . Bipolar disorder Sister   . Drug abuse Paternal Aunt   . Cancer Paternal Grandmother   . Hypertension Paternal Grandmother   . Diabetes Paternal Grandmother   . Arthritis Paternal Grandmother   . Heart disease Paternal Grandmother     Social History   Tobacco Use  . Smoking status: Current Every Day Smoker    Packs/day: 0.50    Years: 5.00    Pack years: 2.50    Types: Cigarettes  . Smokeless tobacco: Never Used  Vaping Use  . Vaping Use: Never used  Substance Use Topics  . Alcohol use: No  . Drug use: No    Home Medications Prior to Admission medications   Medication Sig  Start Date End Date Taking? Authorizing Provider  amphetamine-dextroamphetamine (ADDERALL) 20 MG tablet Take one po qam, then 1/2 tablet 4 hours later. May take an additional 1/2 tablet another 4 hours later prn. Patient taking differently: Take 10-20 mg by mouth. Take 20 mg by mouth in the morning, then 10 mg 4 hours later and  an additional 10 mg another 4 hours later as needed 06/04/19   Nilda Simmer, NP  clonazePAM (KLONOPIN) 0.5 MG tablet TAKE ONE-HALF TO ONE TABLET BY MOUTH TWICE A DAY AS NEEDED FOR ANXIETY Patient taking differently: Take 0.25-0.5 mg by mouth 2 (two) times daily as needed for anxiety.  08/24/19   Kathyrn Drown, MD  etonogestrel (NEXPLANON) 68 MG IMPL implant 68 mg by Subdermal route once.     [provider]  FLUoxetine (PROZAC) 40 MG capsule TAKE ONE CAPSULE (40MG  TOTAL) BY MOUTH DAILY Patient taking differently: Take 40 mg by mouth 2 (two)  times daily.  08/24/19   Kathyrn Drown, MD  hydrOXYzine (VISTARIL) 25 MG capsule Take 1 capsule (25 mg total) by mouth at bedtime as needed (imsomnia). 11/08/19   Kathyrn Drown, MD  SUMAtriptan (IMITREX) 50 MG tablet Take one tablet po at first sign of headache, may repeat 2 hrs later Patient taking differently: Take 50 mg by mouth every 2 (two) hours as needed for migraine.  09/01/19   Mikey Kirschner, MD  valACYclovir (VALTREX) 1000 MG tablet TAKE ONE TABLET ONCE DAILY 02/22/18   Kathyrn Drown, MD    Allergies    Abilify [aripiprazole], Celexa [citalopram hydrobromide], Clindamycin/lincomycin, Penicillins, Zoloft [sertraline hcl], and Amoxil [amoxicillin]  Review of Systems   Review of Systems  Constitutional: Negative for chills and fever.  Eyes: Positive for photophobia.  Gastrointestinal: Positive for nausea. Negative for abdominal pain and vomiting.  Musculoskeletal: Negative for neck pain and neck stiffness.  Skin: Negative for rash.  Neurological: Positive for headaches. Negative for weakness and numbness.  All other systems reviewed and are negative.   Physical Exam Updated Vital Signs BP 129/72   Pulse 73   Temp 98.1 F (36.7 C) (Temporal)   Resp 18   Ht 5\' 8"  (1.727 m)   Wt 79.4 kg   SpO2 99%   BMI 26.61 kg/m   Physical Exam Vitals and nursing note reviewed.  Constitutional:      Appearance: She is not ill-appearing or diaphoretic.     Comments: Sitting in darkened room  HENT:     Head: Normocephalic and atraumatic.  Eyes:     Extraocular Movements: Extraocular movements intact.     Conjunctiva/sclera: Conjunctivae normal.     Pupils: Pupils are equal, round, and reactive to light.  Neck:     Meningeal: Brudzinski's sign and Kernig's sign absent.  Cardiovascular:     Rate and Rhythm: Normal rate and regular rhythm.  Pulmonary:     Effort: Pulmonary effort is normal.     Breath sounds: Normal breath sounds. No wheezing, rhonchi or rales.  Abdominal:      Palpations: Abdomen is soft.     Tenderness: There is no abdominal tenderness.  Musculoskeletal:     Cervical back: Neck supple.  Skin:    General: Skin is warm and dry.  Neurological:     Mental Status: She is alert and oriented to person, place, and time.     Comments: CN 3-12 grossly intact A&O x4 GCS 15 Sensation and strength intact Gait nonataxic including with tandem walking  Coordination with finger-to-nose WNL Neg romberg, neg pronator drift     ED Results / Procedures / Treatments   Labs (all labs ordered are listed, but only abnormal results are displayed) Labs Reviewed - No data to display  EKG None  Radiology No results found.  Procedures Procedures (including critical care time)  Medications Ordered in ED Medications  sodium chloride 0.9 % bolus 1,000 mL (1,000 mLs Intravenous New Bag/Given 12/22/19 1107)  diphenhydrAMINE (BENADRYL) injection 50 mg (50 mg Intravenous Given 12/22/19 1114)  metoCLOPramide (REGLAN) injection 10 mg (10 mg Intravenous Given 12/22/19 1112)  dexamethasone (DECADRON) injection 4 mg (4 mg Intravenous Given 12/22/19 1116)    ED Course  I have reviewed the triage vital signs and the nursing notes.  Pertinent labs & imaging results that were available during my care of the patient were reviewed by me and considered in my medical decision making (see chart for details).    MDM Rules/Calculators/A&P                          29 year old female with a history of migraine headaches who presents to the ED today with complaint of similar migraine headache x4 days.  No improvement with headache cocktail at urgent care yesterday which included Toradol, Decadron, Imitrex IM as well as Zofran.  Arrival to the ED patient is afebrile, nontachycardic and nontachypneic.  She is sitting in a darkened room however appears to be in no acute distress.  She has no focal neuro deficits on exam today.  Her headache does seem consistent with previous migraine  headaches, she denies worst headache of her life and denies thunderclap onset.  No concern for subarachnoid at this time. Pt without meningeal signs. Do not feel patient needs imaging today.  Will provide IV fluids, Reglan, Decadron, Benadryl and reassess.   Reassessed after fluids halfway done - pt reports mild improvement in symptoms. Would like to eat something and rest. Feel this is reasonable. Will let fluids run and then reaassess.   Pt able to tolerate foods and liquid without vomiting. Her pain has improved and she is ready to go home. Will discharge at this time with close PCP follow up. Pt instructed to return for any worsening symptoms. She is in agreement with plan and stable for discharge home.   This note was prepared using Dragon voice recognition software and may include unintentional dictation errors due to the inherent limitations of voice recognition software.  Final Clinical Impression(s) / ED Diagnoses Final diagnoses:  Migraine without aura and without status migrainosus, not intractable    Rx / DC Orders ED Discharge Orders    None       Discharge Instructions     Please drink plenty of fluids to stay hydrated. Continue taking your migraine medication as needed.  Follow up with your PCP for further evaluation.  Return to the ED for any worsening symptoms       Tanda Rockers, Cordelia Poche 12/22/19 1346    Benjiman Core, MD 12/22/19 1615

## 2019-12-22 NOTE — Discharge Instructions (Addendum)
Please drink plenty of fluids to stay hydrated. Continue taking your migraine medication as needed.  Follow up with your PCP for further evaluation.  Return to the ED for any worsening symptoms

## 2019-12-23 ENCOUNTER — Ambulatory Visit (HOSPITAL_COMMUNITY)
Admission: RE | Admit: 2019-12-23 | Discharge: 2019-12-23 | Disposition: A | Discharge status: Home / Self Care | Payer: Medicaid Other | Source: Ambulatory Visit | Attending: Family Medicine | Admitting: Family Medicine

## 2019-12-23 ENCOUNTER — Encounter: Payer: Self-pay | Admitting: Family Medicine

## 2019-12-23 ENCOUNTER — Ambulatory Visit (INDEPENDENT_AMBULATORY_CARE_PROVIDER_SITE_OTHER): Payer: Medicaid Other | Admitting: Family Medicine

## 2019-12-23 VITALS — BP 130/78 | Temp 97.6°F | Wt 183.6 lb

## 2019-12-23 DIAGNOSIS — G43709 Chronic migraine without aura, not intractable, without status migrainosus: Secondary | ICD-10-CM | POA: Insufficient documentation

## 2019-12-23 DIAGNOSIS — F411 Generalized anxiety disorder: Secondary | ICD-10-CM

## 2019-12-23 DIAGNOSIS — M778 Other enthesopathies, not elsewhere classified: Secondary | ICD-10-CM

## 2019-12-23 DIAGNOSIS — M542 Cervicalgia: Secondary | ICD-10-CM

## 2019-12-23 DIAGNOSIS — S199XXA Unspecified injury of neck, initial encounter: Secondary | ICD-10-CM | POA: Diagnosis not present

## 2019-12-23 DIAGNOSIS — G43701 Chronic migraine without aura, not intractable, with status migrainosus: Secondary | ICD-10-CM | POA: Diagnosis not present

## 2019-12-23 MED ORDER — ONDANSETRON 8 MG PO TBDP: 12.0000 mg | ORAL_TABLET | Freq: Three times a day (TID) | ORAL | 2 refills | Status: DC | PRN

## 2019-12-23 MED ORDER — FLUOXETINE HCL 40 MG PO CAPS: ORAL_CAPSULE | ORAL | 5 refills | Status: DC

## 2019-12-23 MED ORDER — NAPROXEN 500 MG PO TABS: 500.0000 mg | ORAL_TABLET | Freq: Two times a day (BID) | ORAL | 0 refills | Status: DC

## 2019-12-23 MED ORDER — SUMATRIPTAN SUCCINATE 50 MG PO TABS: ORAL_TABLET | ORAL | 5 refills | Status: DC

## 2019-12-23 NOTE — Patient Instructions (Signed)
Shoulder Exercises Ask your health care provider which exercises are safe for you. Do exercises exactly as told by your health care provider and adjust them as directed. It is normal to feel mild stretching, pulling, tightness, or discomfort as you do these exercises. Stop right away if you feel sudden pain or your pain gets worse. Do not begin these exercises until told by your health care provider. Stretching exercises External rotation and abduction This exercise is sometimes called corner stretch. This exercise rotates your arm outward (external rotation) and moves your arm out from your body (abduction). 1. Stand in a doorway with one of your feet slightly in front of the other. This is called a staggered stance. If you cannot reach your forearms to the door frame, stand facing a corner of a room. 2. Choose one of the following positions as told by your health care provider: ? Place your hands and forearms on the door frame above your head. ? Place your hands and forearms on the door frame at the height of your head. ? Place your hands on the door frame at the height of your elbows. 3. Slowly move your weight onto your front foot until you feel a stretch across your chest and in the front of your shoulders. Keep your head and chest upright and keep your abdominal muscles tight. 4. Hold for __________ seconds. 5. To release the stretch, shift your weight to your back foot. Repeat __________ times. Complete this exercise __________ times a day. Extension, standing 1. Stand and hold a broomstick, a cane, or a similar object behind your back. ? Your hands should be a little wider than shoulder width apart. ? Your palms should face away from your back. 2. Keeping your elbows straight and your shoulder muscles relaxed, move the stick away from your body until you feel a stretch in your shoulders (extension). ? Avoid shrugging your shoulders while you move the stick. Keep your shoulder blades tucked  down toward the middle of your back. 3. Hold for __________ seconds. 4. Slowly return to the starting position. Repeat __________ times. Complete this exercise __________ times a day. Range-of-motion exercises Pendulum  1. Stand near a wall or a surface that you can hold onto for balance. 2. Bend at the waist and let your left / right arm hang straight down. Use your other arm to support you. Keep your back straight and do not lock your knees. 3. Relax your left / right arm and shoulder muscles, and move your hips and your trunk so your left / right arm swings freely. Your arm should swing because of the motion of your body, not because you are using your arm or shoulder muscles. 4. Keep moving your hips and trunk so your arm swings in the following directions, as told by your health care provider: ? Side to side. ? Forward and backward. ? In clockwise and counterclockwise circles. 5. Continue each motion for __________ seconds, or for as long as told by your health care provider. 6. Slowly return to the starting position. Repeat __________ times. Complete this exercise __________ times a day. Shoulder flexion, standing  1. Stand and hold a broomstick, a cane, or a similar object. Place your hands a little more than shoulder width apart on the object. Your left / right hand should be palm up, and your other hand should be palm down. 2. Keep your elbow straight and your shoulder muscles relaxed. Push the stick up with your healthy arm to   raise your left / right arm in front of your body, and then over your head until you feel a stretch in your shoulder (flexion). ? Avoid shrugging your shoulder while you raise your arm. Keep your shoulder blade tucked down toward the middle of your back. 3. Hold for __________ seconds. 4. Slowly return to the starting position. Repeat __________ times. Complete this exercise __________ times a day. Shoulder abduction, standing 1. Stand and hold a broomstick,  a cane, or a similar object. Place your hands a little more than shoulder width apart on the object. Your left / right hand should be palm up, and your other hand should be palm down. 2. Keep your elbow straight and your shoulder muscles relaxed. Push the object across your body toward your left / right side. Raise your left / right arm to the side of your body (abduction) until you feel a stretch in your shoulder. ? Do not raise your arm above shoulder height unless your health care provider tells you to do that. ? If directed, raise your arm over your head. ? Avoid shrugging your shoulder while you raise your arm. Keep your shoulder blade tucked down toward the middle of your back. 3. Hold for __________ seconds. 4. Slowly return to the starting position. Repeat __________ times. Complete this exercise __________ times a day. Internal rotation  1. Place your left / right hand behind your back, palm up. 2. Use your other hand to dangle an exercise band, a towel, or a similar object over your shoulder. Grasp the band with your left / right hand so you are holding on to both ends. 3. Gently pull up on the band until you feel a stretch in the front of your left / right shoulder. The movement of your arm toward the center of your body is called internal rotation. ? Avoid shrugging your shoulder while you raise your arm. Keep your shoulder blade tucked down toward the middle of your back. 4. Hold for __________ seconds. 5. Release the stretch by letting go of the band and lowering your hands. Repeat __________ times. Complete this exercise __________ times a day. Strengthening exercises External rotation  1. Sit in a stable chair without armrests. 2. Secure an exercise band to a stable object at elbow height on your left / right side. 3. Place a soft object, such as a folded towel or a small pillow, between your left / right upper arm and your body to move your elbow about 4 inches (10 cm) away  from your side. 4. Hold the end of the exercise band so it is tight and there is no slack. 5. Keeping your elbow pressed against the soft object, slowly move your forearm out, away from your abdomen (external rotation). Keep your body steady so only your forearm moves. 6. Hold for __________ seconds. 7. Slowly return to the starting position. Repeat __________ times. Complete this exercise __________ times a day. Shoulder abduction  1. Sit in a stable chair without armrests, or stand up. 2. Hold a __________ weight in your left / right hand, or hold an exercise band with both hands. 3. Start with your arms straight down and your left / right palm facing in, toward your body. 4. Slowly lift your left / right hand out to your side (abduction). Do not lift your hand above shoulder height unless your health care provider tells you that this is safe. ? Keep your arms straight. ? Avoid shrugging your shoulder while you   do this movement. Keep your shoulder blade tucked down toward the middle of your back. 5. Hold for __________ seconds. 6. Slowly lower your arm, and return to the starting position. Repeat __________ times. Complete this exercise __________ times a day. Shoulder extension 1. Sit in a stable chair without armrests, or stand up. 2. Secure an exercise band to a stable object in front of you so it is at shoulder height. 3. Hold one end of the exercise band in each hand. Your palms should face each other. 4. Straighten your elbows and lift your hands up to shoulder height. 5. Step back, away from the secured end of the exercise band, until the band is tight and there is no slack. 6. Squeeze your shoulder blades together as you pull your hands down to the sides of your thighs (extension). Stop when your hands are straight down by your sides. Do not let your hands go behind your body. 7. Hold for __________ seconds. 8. Slowly return to the starting position. Repeat __________ times.  Complete this exercise __________ times a day. Shoulder row 1. Sit in a stable chair without armrests, or stand up. 2. Secure an exercise band to a stable object in front of you so it is at waist height. 3. Hold one end of the exercise band in each hand. Position your palms so that your thumbs are facing the ceiling (neutral position). 4. Bend each of your elbows to a 90-degree angle (right angle) and keep your upper arms at your sides. 5. Step back until the band is tight and there is no slack. 6. Slowly pull your elbows back behind you. 7. Hold for __________ seconds. 8. Slowly return to the starting position. Repeat __________ times. Complete this exercise __________ times a day. Shoulder press-ups  1. Sit in a stable chair that has armrests. Sit upright, with your feet flat on the floor. 2. Put your hands on the armrests so your elbows are bent and your fingers are pointing forward. Your hands should be about even with the sides of your body. 3. Push down on the armrests and use your arms to lift yourself off the chair. Straighten your elbows and lift yourself up as much as you comfortably can. ? Move your shoulder blades down, and avoid letting your shoulders move up toward your ears. ? Keep your feet on the ground. As you get stronger, your feet should support less of your body weight as you lift yourself up. 4. Hold for __________ seconds. 5. Slowly lower yourself back into the chair. Repeat __________ times. Complete this exercise __________ times a day. Wall push-ups  1. Stand so you are facing a stable wall. Your feet should be about one arm-length away from the wall. 2. Lean forward and place your palms on the wall at shoulder height. 3. Keep your feet flat on the floor as you bend your elbows and lean forward toward the wall. 4. Hold for __________ seconds. 5. Straighten your elbows to push yourself back to the starting position. Repeat __________ times. Complete this exercise  __________ times a day. This information is not intended to replace advice given to you by your health care provider. Make sure you discuss any questions you have with your health care provider. Document Revised: 10/09/2018 Document Reviewed: 07/17/2018 Elsevier Patient Education  2020 [employer]  
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
## 2019-12-28 ENCOUNTER — Ambulatory Visit: Payer: Medicaid Other | Admitting: Family Medicine

## 2019-12-29 DIAGNOSIS — F332 Major depressive disorder, recurrent severe without psychotic features: Secondary | ICD-10-CM | POA: Diagnosis not present

## 2020-01-04 ENCOUNTER — Encounter: Payer: Self-pay | Admitting: Family Medicine

## 2020-01-05 DIAGNOSIS — M25511 Pain in right shoulder: Secondary | ICD-10-CM | POA: Diagnosis not present

## 2020-01-05 DIAGNOSIS — S139XXA Sprain of joints and ligaments of unspecified parts of neck, initial encounter: Secondary | ICD-10-CM | POA: Diagnosis not present

## 2020-01-07 DIAGNOSIS — F332 Major depressive disorder, recurrent severe without psychotic features: Secondary | ICD-10-CM | POA: Diagnosis not present

## 2020-01-11 ENCOUNTER — Encounter: Payer: Self-pay | Admitting: Orthopaedic Surgery

## 2020-01-11 ENCOUNTER — Ambulatory Visit (INDEPENDENT_AMBULATORY_CARE_PROVIDER_SITE_OTHER): Payer: Medicaid Other | Admitting: Orthopaedic Surgery

## 2020-01-11 ENCOUNTER — Other Ambulatory Visit: Payer: Self-pay

## 2020-01-11 VITALS — BP 113/78 | HR 78 | Ht 68.0 in | Wt 180.0 lb

## 2020-01-11 DIAGNOSIS — M542 Cervicalgia: Secondary | ICD-10-CM
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
## 2020-01-17 ENCOUNTER — Other Ambulatory Visit: Payer: Self-pay

## 2020-01-17 ENCOUNTER — Encounter (HOSPITAL_COMMUNITY): Payer: Self-pay

## 2020-01-17 ENCOUNTER — Ambulatory Visit (HOSPITAL_COMMUNITY)
Admission: EM | Admit: 2020-01-17 | Discharge: 2020-01-17 | Disposition: A | Discharge status: Other Acute Inpatient Hospital | Payer: Medicaid Other

## 2020-01-17 NOTE — Progress Notes (Signed)
Patient denies SI/HI/Psychosis/Substsance Abuse.   Patient requests referral information for providers in [facility] where she resides.  Writer referred patient to [facility].

## 2020-01-18 ENCOUNTER — Other Ambulatory Visit: Payer: Self-pay

## 2020-01-18 ENCOUNTER — Emergency Department (HOSPITAL_COMMUNITY)
Admission: EM | Admit: 2020-01-18 | Discharge: 2020-01-18 | Disposition: A | Discharge status: Home / Self Care | Payer: Medicaid Other | Attending: Emergency Medicine | Admitting: Emergency Medicine

## 2020-01-18 ENCOUNTER — Encounter (HOSPITAL_COMMUNITY): Payer: Self-pay | Admitting: *Deleted

## 2020-01-18 DIAGNOSIS — R454 Irritability and anger: Secondary | ICD-10-CM | POA: Diagnosis not present

## 2020-01-18 DIAGNOSIS — F17210 Nicotine dependence, cigarettes, uncomplicated: Secondary | ICD-10-CM | POA: Insufficient documentation
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
## 2020-01-18 NOTE — ED Triage Notes (Signed)
Pt states she feels angry all the time and the medication she is taking is not working that she is has been trying for past week.  Pt denies SI or HI.

## 2020-01-18 NOTE — Discharge Instructions (Addendum)
Follow up with PCP Keep psychiatrist apt  Use guides Come back to the emergency room if you have any thoughts of harming yourself, anyone else or having any hallucinations.

## 2020-01-19 ENCOUNTER — Telehealth: Payer: Self-pay | Admitting: Family Medicine

## 2020-01-19 ENCOUNTER — Telehealth: Payer: Self-pay | Admitting: *Deleted

## 2020-01-19 ENCOUNTER — Encounter: Payer: Self-pay | Admitting: Nurse Practitioner

## 2020-01-19 DIAGNOSIS — F411 Generalized anxiety disorder: Secondary | ICD-10-CM

## 2020-01-19 NOTE — Telephone Encounter (Signed)
Please send message back if pt does not take appt.

## 2020-01-19 NOTE — Telephone Encounter (Signed)
Tracy Flores had a cancelation and Tracy Flores was going to see if pt could take this time slot.

## 2020-01-19 NOTE — Telephone Encounter (Signed)
Grandmom(Tracy Flores) is calling to get appointment for patient to go over medication because of her anger issues . Please review ER notes from yesterday. Patient has appointment with [name] on 7/30 but grandmom wants her seen before than if possible I explained with have no appointment available . Please advise

## 2020-01-19 NOTE — Telephone Encounter (Signed)
Pt also had a patient call wanting to be worked in sooner. [name] had a 10:30 canceled appt for tomorrow and erica is going to call and get her scheduled for that time slot

## 2020-01-19 NOTE — Telephone Encounter (Signed)
Appointment schedule with Tracy Flores on tomorrow by 9:40

## 2020-01-19 NOTE — Telephone Encounter (Signed)
°  Transition Care Management Follow-up Telephone Call   [facility] Managed Care Transition Call Status:MM [facility] Call Made   Date of discharge and from where: [facility] [place], 01/18/20   How have you been since you were released from the hospital? "The same"   Any questions or concerns? Would like to find MD to help; went to [facility] [address] She was told they could not accept her Medicaid because she does not live in [facility]  Items Reviewed:  Did the pt receive and understand the discharge instructions provided? Yes   Medications obtained and verified? Yes   Any new allergies since your discharge? No   Dietary orders reviewed? Yes  Do you have support at home? Yes  Functional Questionnaire: (I = Independent and D = Dependent)  ADLs: Independent Bathing/Dressing:Independent Meal Prep: Independent Eating: Independent Maintaining continence: Independent Transferring/Ambulation: Independent Managing Meds: Independent Follow up appointments reviewed:  PCP Hospital f/u appt confirmed? Yes  Scheduled to see Sherie Don on 01/27/30 @ [facility] Hospital f/u appt confirmed? n/a.  Are transportation arrangements needed? No   If their condition worsens, is the pt aware to call PCP or go to the EmergencyDept.? Yes  Was the patient provided with contact information for the PCP's office or ED? Yes  Was to pt encouraged to call back with questions or concerns? Yes  Order placed for [facility]  Burnard Bunting, RN, BSN, CCRN Patient Engagement Center [phone]

## 2020-01-20 ENCOUNTER — Ambulatory Visit (INDEPENDENT_AMBULATORY_CARE_PROVIDER_SITE_OTHER): Payer: Medicaid Other | Admitting: Nurse Practitioner

## 2020-01-20 ENCOUNTER — Ambulatory Visit (HOSPITAL_COMMUNITY): Payer: Medicaid Other | Attending: Family Medicine | Admitting: Occupational Therapy

## 2020-01-20 ENCOUNTER — Encounter: Payer: Self-pay | Admitting: Nurse Practitioner

## 2020-01-20 ENCOUNTER — Telehealth: Payer: Self-pay | Admitting: *Deleted

## 2020-01-20 ENCOUNTER — Other Ambulatory Visit: Payer: Self-pay

## 2020-01-20 VITALS — BP 110/74 | Temp 97.2°F | Wt 183.2 lb

## 2020-01-20 DIAGNOSIS — M542 Cervicalgia: Secondary | ICD-10-CM | POA: Diagnosis not present

## 2020-01-20 DIAGNOSIS — G8929 Other chronic pain: Secondary | ICD-10-CM | POA: Diagnosis not present

## 2020-01-20 DIAGNOSIS — M25511 Pain in right shoulder: Secondary | ICD-10-CM | POA: Diagnosis not present

## 2020-01-20 DIAGNOSIS — R29898 Other symptoms and signs involving the musculoskeletal system: Secondary | ICD-10-CM | POA: Insufficient documentation

## 2020-01-20 DIAGNOSIS — F329 Major depressive disorder, single episode, unspecified: Secondary | ICD-10-CM

## 2020-01-20 DIAGNOSIS — F419 Anxiety disorder, unspecified: Secondary | ICD-10-CM

## 2020-01-20 MED ORDER — ESCITALOPRAM OXALATE 10 MG PO TABS: 10.0000 mg | ORAL_TABLET | Freq: Every day | ORAL | 2 refills | Status: DC

## 2020-01-20 NOTE — Telephone Encounter (Signed)
Note opened in error, please disregard
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
## 2020-01-20 NOTE — Patient Instructions (Signed)
1) Flexibility: Neck Stretch   Grasp left arm above wrist and pull down across body while gently tilting head same direction.  Hold 10 seconds, complete 3-5X   2) Levator Scapula Stretch   Place left hand on same side shoulder blade. With other hand, gently stretch head down and away.  Hold 10 seconds, complete 3-5X  3) Flexibility: Neck Stretch   Grasp left arm above wrist and pull down across body while gently tilting head same direction.  Hold 10 seconds, complete 3-5X  4) Flexibility: Neck Retraction   Pull head straight back, keeping eyes and jaw level. *Give yourself a double chin.*  Hold 10 seconds, complete 3-5X  5) Lower Cervical / Upper Thoracic Stretch   Clasp hands together in front with arms extended. Gently pull shoulder blades apart and bend head forward. Hold 10 seconds, complete 3-5X

## 2020-01-20 NOTE — Progress Notes (Signed)
Subjective:    Patient ID: Tracy Flores, female    DOB: 1991-01-01, 29 y.o.   MRN: [id number]  HPI Pt went to [facility] ER on 01/18/20 for difficulty controlling anger. Pt went to [facility] but they would not accept her due to insurance.   [facility] informed pt to take 8 hydroxyzine capsules daily. 2 in the morning, 2 in the evening and up to 4 at bedtime. [facility] discontinued Clonazepam 0.5 mg. Pt had 2 visits at [facility]. Pt does have appt 02/04/20 with [facility]; video visit. Pt states she can not wait that long.  Provider at [facility] advised her to stop the Klonopin.  Tried the hydroxyzine as directed but had no impact on her anxiety.  Did not cause any drowsiness.  Was told to continue the Prozac but patient stopped this on her own about a month ago.  States she did not feel any different while taking it.  Has follow-up with psychiatry on 8/6.  [facility] [place] [facility] to discuss starting counseling.  Denies suicidal or homicidal thoughts or ideation. Denies any alcohol or drug use.  Has increased her smoking to 1 pack/day due to the level of her anxiety. GAD 7 : Generalized Anxiety Score 01/20/2020 04/23/2019 07/10/2018 06/04/2018  Nervous, Anxious, on Edge 3 3 1 3   Control/stop worrying 3 3 1 3   Worry too much - different things 3 3 0 3  Trouble relaxing 3 3 1 3   Restless 3 3 1 2   Easily annoyed or irritable 3 3 2 3   Afraid - awful might happen 2 1 1 3   Total GAD 7 Score 20 19 7 20   Anxiety Difficulty Extremely difficult Somewhat difficult Somewhat difficult Very difficult    Depression screen [facility] 2/9 01/20/2020 04/23/2019 07/10/2018 06/04/2018 05/25/2018  Decreased Interest 3 2 1 1 1   Down, Depressed, Hopeless 3 2 1 2 1   PHQ - 2 Score 6 4 2 3 2   Altered sleeping 2 2 0 1 0  Tired, decreased energy 3 2 1 2 2   Change in appetite 3 0 0 0 0  Feeling bad or failure about yourself  3 1 2 2 1   Trouble concentrating - 1 0 2 0  Moving slowly or  fidgety/restless 2 2 1 2 2   Suicidal thoughts 0 0 0 0 0  PHQ-9 Score 19 12 6 12 7   Difficult doing work/chores Extremely dIfficult Somewhat difficult Somewhat difficult Somewhat difficult Somewhat difficult     Review of Systems     Objective:   Physical Exam NAD.  Alert, oriented.  Making good eye contact.  Dressed appropriately.  Mildly anxious affect.  Thoughts logical coherent and relevant.       Assessment & Plan:   Problem List Items Addressed This Visit      Other   Anxiety and depression - Primary   Relevant Medications   escitalopram (LEXAPRO) 10 MG tablet     Meds ordered this encounter  Medications   escitalopram (LEXAPRO) 10 MG tablet    Sig: Take 1 tablet (10 mg total) by mouth daily.    Dispense:  30 tablet    Refill:  2    Order Specific Question:   Supervising Provider    Answer:   [facility] [[id number]]   Will start Lexapro today.  Patient to let her psychiatrist know at her next visit.  Reviewed potential adverse effects.  DC med and contact office if  any serious side effects.  Strongly encouraged her to follow-up for mental health counseling as planned.  Seek help immediately if any suicidal thoughts or ideation.  Patient agrees with this plan.

## 2020-01-21 ENCOUNTER — Encounter: Payer: Self-pay | Admitting: Nurse Practitioner

## 2020-01-21 DIAGNOSIS — F419 Anxiety disorder, unspecified: Secondary | ICD-10-CM | POA: Insufficient documentation

## 2020-01-21 DIAGNOSIS — F329 Major depressive disorder, single episode, unspecified: Secondary | ICD-10-CM | POA: Insufficient documentation

## 2020-01-24 DIAGNOSIS — F422 Mixed obsessional thoughts and acts: Secondary | ICD-10-CM | POA: Diagnosis not present

## 2020-01-24 DIAGNOSIS — F411 Generalized anxiety disorder: Secondary | ICD-10-CM | POA: Diagnosis not present

## 2020-01-24 DIAGNOSIS — F341 Dysthymic disorder: Secondary | ICD-10-CM | POA: Diagnosis not present

## 2020-01-24 DIAGNOSIS — F900 Attention-deficit hyperactivity disorder, predominantly inattentive type: Secondary | ICD-10-CM | POA: Diagnosis not present

## 2020-01-28 ENCOUNTER — Ambulatory Visit: Payer: Medicaid Other | Admitting: Nurse Practitioner

## 2020-01-31 DIAGNOSIS — F341 Dysthymic disorder: Secondary | ICD-10-CM | POA: Diagnosis not present

## 2020-01-31 DIAGNOSIS — F900 Attention-deficit hyperactivity disorder, predominantly inattentive type: Secondary | ICD-10-CM | POA: Diagnosis not present

## 2020-01-31 DIAGNOSIS — F422 Mixed obsessional thoughts and acts: Secondary | ICD-10-CM | POA: Diagnosis not present

## 2020-01-31 DIAGNOSIS — F411 Generalized anxiety disorder: Secondary | ICD-10-CM | POA: Diagnosis not present

## 2020-02-01 ENCOUNTER — Encounter: Payer: Self-pay | Admitting: Orthopaedic Surgery

## 2020-02-01 ENCOUNTER — Other Ambulatory Visit: Payer: Self-pay

## 2020-02-01 ENCOUNTER — Ambulatory Visit: Payer: Medicaid Other | Admitting: Orthopaedic Surgery

## 2020-02-01 VITALS — BP 115/79 | HR 71 | Ht 68.0 in | Wt 183.0 lb

## 2020-02-01 DIAGNOSIS — M542 Cervicalgia: Secondary | ICD-10-CM | POA: Diagnosis not present
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
## 2020-02-02 ENCOUNTER — Encounter: Payer: Self-pay | Admitting: *Deleted

## 2020-02-02 ENCOUNTER — Other Ambulatory Visit: Payer: Self-pay | Admitting: *Deleted

## 2020-02-02 NOTE — Telephone Encounter (Signed)
This encounter was created in error - please disregard.

## 2020-02-02 NOTE — Patient Outreach (Addendum)
Care Coordination  02/02/2020  Tracy Flores January 18, 1991 [id number]   Subjective: Telephone call to patient's home  number, no answer, left HIPAA compliant voicemail message, and requested call back.   Objective: Per KPN (Knowledge Performance Now, point of care tool) and chart review, patient has not had any hospitalizations in the last 6 months.  Patient had ED visit on 01/18/2020 for Difficulty controlling anger, and on 12/22/2019 for migraine.  Patient also has a history of the following: anxiety, major depression, OCD, gonorrhea, HSV-2 (herpes simplex virus 2) infection, IBS (irritable bowel syndrome), Interstitial cystitis, Reactive airway disease, Migraine, and ADD (attention deficit disorder).    Assessment: Received Managed Medicaid referral on 01/19/2020.  Referral source: Tracy Flores.  Referral reason: Reason for Referral: Care Coordination [facility]),  Patient has follow up appointment with Primary Care Provider; patient needs [facility] provider.    Screening  follow up pending patient contact.      Plan: RNCM will send unsuccessful outreach  letter, [facility] pamphlet, will call patient for 2nd telephone outreach attempt within 4 business days, screening follow up, and will proceed with case closure, after 4th unsuccessful outreach call.    Tracy Flores, BSN, CCM [facility] Care Management [facility] Telephonic CM Phone: [phone] Fax: [phone]

## 2020-02-03 ENCOUNTER — Ambulatory Visit (HOSPITAL_COMMUNITY): Payer: Medicaid Other | Attending: Family Medicine | Admitting: Occupational Therapy

## 2020-02-03 ENCOUNTER — Other Ambulatory Visit: Payer: Self-pay

## 2020-02-03 DIAGNOSIS — M25511 Pain in right shoulder: Secondary | ICD-10-CM | POA: Diagnosis not present

## 2020-02-03 DIAGNOSIS — M542 Cervicalgia: Secondary | ICD-10-CM | POA: Diagnosis not present

## 2020-02-03 DIAGNOSIS — G8929 Other chronic pain: Secondary | ICD-10-CM | POA: Diagnosis not present

## 2020-02-03 DIAGNOSIS — R29898 Other symptoms and signs involving the musculoskeletal system: Secondary | ICD-10-CM
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
## 2020-02-04 ENCOUNTER — Ambulatory Visit (HOSPITAL_COMMUNITY): Payer: Medicaid Other | Admitting: Occupational Therapy

## 2020-02-04 ENCOUNTER — Telehealth (HOSPITAL_COMMUNITY): Payer: Self-pay | Admitting: Occupational Therapy

## 2020-02-04 NOTE — Telephone Encounter (Signed)
pt called to cx today's appt. lmonvm 

## 2020-02-07 ENCOUNTER — Ambulatory Visit (HOSPITAL_COMMUNITY)
Admission: RE | Admit: 2020-02-07 | Discharge: 2020-02-07 | Disposition: A | Discharge status: Home / Self Care | Payer: Medicaid Other | Attending: Psychiatry | Admitting: Psychiatry

## 2020-02-07 ENCOUNTER — Ambulatory Visit: Payer: Self-pay | Admitting: *Deleted

## 2020-02-07 NOTE — H&P (Signed)
Behavioral Health Medical Screening Exam  Tracy Flores is an 29 y.o. female.who presented as a walk-in, voluntarily. Stated that she came to the hospital because  was unsure if she has the right diagnosis as lately, she has felt more angry and irritable with no known triggers.Stated that when she becomes upset and irritable she will do something like," throw my phone which scares my 29 year old." She denied physical aggression towards son or others. She denied current SI, HI and psychosis. She reported a psychiatric history significant  for depression, anxiety, ADD, OCD and stated she is on psychoparetic medications prescribed by her PCP. Stated she recently started outpatient therapy with [name] at [facility]. Stated that she has had only two sessions with another session scheduled  for tomorrow and stated that she was told that she would have to have a number of sessions before she is able to see a psychiatrist. Reported current medications as  Lexapro 10 mg and Adderall 20 mg/ Stated she does not think that the medications are effective. Stated she has had one prior suicide attempt that occurred 20 years ago which led to a psychiatric hospitalization.at [facility]. Stated she has had at least one other hospitalization at [facility] since then. She denied NSSIB but stated when she does become upset or angry, she will pull her hair. She denied legal issues. Denied trauma related history. Reported smoking mariajuana  at least once per week although denied other substance abuse or use.   Total Time spent with patient: 20 minutes  Psychiatric Specialty Exam: Physical Exam Psychiatric:        Behavior: Behavior normal.        Thought Content: Thought content normal.        Judgment: Judgment normal.     Comments: Anger, irritability (mood).    Review of Systems  Psychiatric/Behavioral: Positive for sleep disturbance. Negative for agitation, behavioral problems,  confusion, decreased concentration, dysphoric mood, hallucinations, self-injury and suicidal ideas. The patient is not nervous/anxious and is not hyperactive.        Anger/ rritability.    Last menstrual period 01/13/2020.There is no height or weight on file to calculate BMI. General Appearance: Fairly Groomed Eye Contact:  Good Speech:  Clear and Coherent and Normal Rate Volume:  Normal Mood:  Anxious Affect:  Appropriate Thought Process:  Coherent, Linear and Descriptions of Associations: Intact Orientation:  Full (Time, Place, and Person) Thought Content:  Logical Suicidal Thoughts:  No Homicidal Thoughts:  No Memory:  Immediate;   Fair Recent;   Fair Remote;   Fair Judgement:  Fair Insight:  Fair Psychomotor Activity:  Normal Concentration: Concentration: Fair and Attention Span: Fair Recall:  [employer] of Knowledge:Fair Language: Good Akathisia:  Negative Handed:  Right AIMS (if indicated):    Assets:  Communication Skills Desire for Improvement Resilience Social Support Sleep:     Musculoskeletal: Strength & Muscle Tone: within normal limits Gait & Station: normal Patient leans: N/A  Last menstrual period 01/13/2020.  Recommendations: Based on my evaluation the patient does not appear to have an emergency medical condition.   No evidence of imminent risk to self or others at present.   Patient does not meet criteria for psychiatric inpatient admission. Reccommended to continue follow-up [facility] for ongoing therapy. Her next appointment is scheduled for 02/08/2020 at 2:00 pm. Resources were provided for outpatient psychiatry. She also stated that she would follow-up with [facility] tomorrow about establishing a psychiatric  provider to review current medications.     Denzil Magnuson, NP 02/07/2020, 4:52 PM

## 2020-02-07 NOTE — BH Assessment (Signed)
Comprehensive Clinical Assessment (CCA) Screening, Triage and Referral Note  02/07/2020 Tracy Flores [id number] Patient presents this date with anger issues and is requesting information and resources to assist with ongoing needs. Patient denies any S/I, H/I or AVH.  Patient states she is currently receiving services from her PCP who assists with medication management for anxiety. Patient denies any prior attempts or gestures at self harm. Patient is also receiving counseling from youth services. Per Tracy Fus NP who evaluated patient notes: Patient presented as a walk-in, voluntarily. Stated that she came to the hospital because  was unsure if she has the right diagnosis as lately, she has felt more angry and irritable with no known triggers. Stated that when she becomes upset and irritable she will do something like," throw my phone which scares my 30 year old." She denied physical aggression towards son or others. She denied current SI, HI and psychosis. She reported a psychiatric history significant  for depression, anxiety, ADD, OCD and stated she is on psychoparetic medications prescribed by her PCP. Stated she recently started outpatient therapy with Tracy Flores at [facility]. Stated that she has had only two sessions with another session scheduled  for tomorrow and stated that she was told that she would have to have a number of sessions before she is able to see a psychiatrist. Reported current medications as  Lexapro 10 mg and Adderall 20 mg/ Stated she does not think that the medications are effective. Stated she has had one prior suicide attempt that occurred 20 years ago which led to a psychiatric hospitalization.at [facility]. Stated she has had at least one other hospitalization at [facility] since then. She denied NSSIB but stated when she does become upset or angry, she will pull her hair. She denied legal issues. Denied trauma related history. Reported smoking  mariajuana  at least once per week although denied other substance abuse or use.    Patient is oriented x 4 and presents with a pleasant affect. Patient's memory is intact and thoughts organized. Patient does not appear to be responding to internal stimuli. Tracy Fus NP recommended that patient be provided with OP resources.        Visit Diagnosis: GAD Patient Reported Information How did you hear about [place]? Self   Referral name: No data recorded  Referral phone number: No data recorded Whom do you see for routine medical problems? I don't have a doctor   Practice/Facility Name: No data recorded  Practice/Facility Phone Number: No data recorded  Name of Contact: No data recorded  Contact Number: No data recorded  Contact Fax Number: No data recorded  Prescriber Name: No data recorded  Prescriber Address (if known): No data recorded What Is the Reason for Your Visit/Call Today? Ongoing anger issues  How Long Has This Been Causing You Problems? > than 6 months  Have You Recently Been in Any Inpatient Treatment (Hospital/Detox/Crisis Center/28-Day Program)? No   Name/Location of Program/Hospital:No data recorded  How Long Were You There? No data recorded  When Were You Discharged? No data recorded Have You Ever Received Services From [facility] Before? No   Who Do You See at [facility]? No data recorded Have You Recently Had Any Thoughts About Hurting Yourself? No   Are You Planning to Commit Suicide/Harm Yourself At This time?  No  Have you Recently Had Thoughts About Hurting Someone [name]? No   Explanation: No data recorded Have You Used Any Alcohol or Drugs in  the Past 24 Hours? No   How Long Ago Did You Use Drugs or Alcohol?  No data recorded  What Did You Use and How Much? No data recorded What Do You Feel Would Help You the Most Today? Medication;Therapy  Do You Currently Have a Therapist/Psychiatrist? No   Name of Therapist/Psychiatrist: No data recorded  Have You  Been Recently Discharged From Any Office Practice or Programs? No   Explanation of Discharge From Practice/Program:  No data recorded    CCA Screening Triage Referral Assessment Type of Contact: Face-to-Face   Is this Initial or Reassessment? No data recorded  Date Telepsych consult ordered in [facility]:  No data recorded  Time Telepsych consult ordered in [facility]:  No data recorded Patient Reported Information Reviewed? Yes   Patient Left Without Being Seen? No data recorded  Reason for Not Completing Assessment: No data recorded Collateral Involvement: No data recorded Does Patient Have a Court Appointed Legal Guardian? No data recorded  Name and Contact of Legal Guardian:  No data recorded If Minor and Not Living with Parent(s), Who has Custody? No data recorded Is CPS involved or ever been involved? Never  Is APS involved or ever been involved? Never  Patient Determined To Be At Risk for Harm To Self or Others Based on Review of Patient Reported Information or Presenting Complaint? No   Method: No data recorded  Availability of Means: No data recorded  Intent: No data recorded  Notification Required: No data recorded  Additional Information for Danger to Others Potential:  No data recorded  Additional Comments for Danger to Others Potential:  No data recorded  Are There Guns or Other Weapons in Your Home?  No data recorded   Types of Guns/Weapons: No data recorded   Are These Weapons Safely Secured?                              No data recorded   Who Could Verify You Are Able To Have These Secured:    No data recorded Do You Have any Outstanding Charges, Pending Court Dates, Parole/Probation? No data recorded Contacted To Inform of Risk of Harm To Self or Others: Other: Comment (NA)  Location of Assessment: [facility]  Does Patient Present under Involuntary Commitment? No   IVC Papers Initial File Date: No data recorded  [place] of Residence: [place]  Patient  Currently Receiving the Following Services: Individual Therapy   Determination of Need: No data recorded  Options For Referral: Outpatient Therapy   Alfredia Ferguson, LCAS

## 2020-02-08 ENCOUNTER — Other Ambulatory Visit: Payer: Self-pay

## 2020-02-08 ENCOUNTER — Encounter (HOSPITAL_COMMUNITY): Payer: Self-pay

## 2020-02-08 ENCOUNTER — Ambulatory Visit (HOSPITAL_COMMUNITY): Payer: Medicaid Other

## 2020-02-08 DIAGNOSIS — G8929 Other chronic pain: Secondary | ICD-10-CM | POA: Diagnosis not present

## 2020-02-08 DIAGNOSIS — F900 Attention-deficit hyperactivity disorder, predominantly inattentive type: Secondary | ICD-10-CM | POA: Diagnosis not present

## 2020-02-08 DIAGNOSIS — M542 Cervicalgia: Secondary | ICD-10-CM

## 2020-02-08 DIAGNOSIS — M25511 Pain in right shoulder: Secondary | ICD-10-CM | POA: Diagnosis not present

## 2020-02-08 DIAGNOSIS — R29898 Other symptoms and signs involving the musculoskeletal system: Secondary | ICD-10-CM | POA: Diagnosis not present

## 2020-02-08 DIAGNOSIS — F422 Mixed obsessional thoughts and acts: Secondary | ICD-10-CM | POA: Diagnosis not present

## 2020-02-08 DIAGNOSIS — F411 Generalized anxiety disorder: Secondary | ICD-10-CM | POA: Diagnosis not present

## 2020-02-08 DIAGNOSIS — F341 Dysthymic disorder: Secondary | ICD-10-CM | POA: Diagnosis not present

## 2020-02-08 NOTE — Therapy (Signed)
[facility] [address] [place], [place], [address] Phone: [phone]   Fax:  [phone]  Occupational Therapy Treatment  Patient Details  Name: Tracy Flores MRN: [id number] Date of Birth: 1991-04-08 Referring Provider (OT): Dr. Darreld Mclean   Encounter Date: 02/08/2020   OT End of Session - 02/08/20 1616    Visit Number 3    Number of Visits 8    Date for OT Re-Evaluation 02/19/20    Authorization Type Medicaid-Healthy Blue    Authorization Time Period Approved 40 visits (02/23/20-02/23/20)    Authorization - Visit Number 3    Authorization - Number of Visits 40    OT Start Time 1600    OT Stop Time 1638    OT Time Calculation (min) 38 min    Activity Tolerance Patient tolerated treatment well    Behavior During Therapy [facility] for tasks assessed/performed           Past Medical History:  Diagnosis Date  . Abnormal pap   . Acid reflux disease   . Anxiety   . History of gonorrhea 2013  . 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 IBS (irritable bowel syndrome)   . Interstitial cystitis   . Major depression   . OCD (obsessive compulsive disorder)   . Pregnancy   . Reactive airway disease   . Reflux   . Restless legs   . URI (upper respiratory infection)   . Vaginal Pap smear, abnormal     Past Surgical History:  Procedure Laterality Date  . CERVICAL CONIZATION W/BX N/A 09/21/2019   Procedure: COLD KNIFE CONIZATION CERVIX WITH BIOPSY;  Surgeon: Tilda Burrow, MD;  Location: [facility];  Service: Gynecology;  Laterality: N/A;  . NO PAST SURGERIES      There were no vitals filed for this visit.   Subjective Assessment - 02/08/20 1608    Subjective  S: Certain movements or activities will make it more sore.    Currently in Pain? Yes    Pain Score 5     Pain Location Neck    Pain Orientation Right    Pain Descriptors / Indicators Sore    Pain Type Chronic pain    Pain Radiating Towards neck to shoulder     Pain Onset More than a month ago    Pain Frequency Constant    Aggravating Factors  Frequent household tasks such as mopping, sweeping, sleeping comfortably.    Pain Relieving Factors Naproxen when needed.    Effect of Pain on Daily Activities mod effect    Multiple Pain Sites No              [facility] OT Assessment - 02/08/20 1610      Assessment   Medical Diagnosis right shoulder and cervical pain      Precautions   Precautions None                    OT Treatments/Exercises (OP) - 02/08/20 1609      Exercises   Exercises Shoulder;Neck      Neck Exercises: Seated   Cervical Rotation Both;5 reps    Lateral Flexion Both;5 reps      Shoulder Exercises: Standing   Flexion Strengthening;15 reps   to shoulder level   Shoulder Flexion Weight (lbs) 2    Extension Theraband;15 reps    Theraband Level (Shoulder Extension) Level 2 (Red)    Row Theraband;15 reps   palm  facing and palm up   Theraband Level (Shoulder Row) Level 2 (Red)    Retraction Theraband;15 reps    Theraband Level (Shoulder Retraction) Level 2 (Red)    Other Standing Exercises Shoulder scaption to shoulder level; 2#; 15X    Other Standing Exercises Hammer curl to overhead press; 2#; 15X      Shoulder Exercises: ROM/Strengthening   UBE (Upper Arm Bike) Level 2 2' reverse 2' forward   pace: 5.0-7.0   Ball on Wall 1' flexion 1' abduction red weighted ball                    OT Short Term Goals - 02/03/20 1220      OT SHORT TERM GOAL #1   Title Pt will be provided with and educated on HEP to improve mobility in RUE required for ADL completion.    Time 4    Period Weeks    Status On-going    Target Date 02/19/20      OT SHORT TERM GOAL #2   Title Pt will decrease fascial restrictions in RUE and cervical neck to improve mobility required for functional reaching tasks.    Time 4    Period Weeks    Status On-going      OT SHORT TERM GOAL #3   Title Pt will increase RUE A/ROM to WNL to  improve ability to reach behind back during dressing and bathing.    Time 4    Period Weeks    Status On-going      OT SHORT TERM GOAL #4   Title Pt will decrease RUE and cervical neck pain to 3/10 or less to improve ability to sleep at night.    Time 4    Period Weeks    Status On-going      OT SHORT TERM GOAL #5   Title Pt will increase cervical neck ROM to [facility] [employer] to improve ability to turn head when driving.    Time 4    Period Weeks    Status On-going                    Plan - 02/08/20 1656    Clinical Impression Statement A: Heat applied at start of session to decrease cervical fascial restrictions and increase joint mobility. patient demonstrates full cervical rotation left and right as well as full right shoulder A/ROM. Focused session on shoulder and scapular strengthening to improve cervical stability and postural muscles. VC for form and tecnhnique were provided with patient demonstrating good carry over of education.    Body Structure / Function / Physical Skills ADL;Endurance;UE functional use;Pain;Fascial restriction;ROM;IADL;Strength    Plan P: Follow up on pain level. Continue with scapular and shoulder strengthening. Provide red band and scapular strengthening to HEP if warrented.    Consulted and Agree with Plan of Care Patient           Patient will benefit from skilled therapeutic intervention in order to improve the following deficits and impairments:   Body Structure / Function / Physical Skills: ADL, Endurance, UE functional use, Pain, Fascial restriction, ROM, IADL, Strength       Visit Diagnosis: Other symptoms and signs involving the musculoskeletal system  Cervical pain (neck)  Chronic right shoulder pain    Problem List Patient Active Problem List   Diagnosis Date Noted  . Anxiety and depression 01/21/2020  . Migraine, chronic, without aura 12/23/2019  . 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  Periodic limb movements of  sleep 08/07/2019  . Furuncle of pubic region 07/07/2019  . Episodic tension-type headache, not intractable 04/30/2019  . ADD (attention deficit disorder) 03/26/2013  . Generalized anxiety disorder 11/13/2012  . Genital herpes 11/13/2012  . Nexplanon insertion 10/27/2012   Tracy Flores, OTR/L,CBIS  [phone]  02/08/2020, 4:59 PM  [facility] [address] [place], [place], [address] Phone: [phone]   Fax:  [phone]  Name: EMNET MONK MRN: [id number] Date of Birth: Nov 10, 1990

## 2020-02-10 ENCOUNTER — Encounter (HOSPITAL_COMMUNITY): Payer: Self-pay

## 2020-02-10 ENCOUNTER — Ambulatory Visit (HOSPITAL_COMMUNITY): Payer: Medicaid Other

## 2020-02-10 ENCOUNTER — Other Ambulatory Visit: Payer: Self-pay

## 2020-02-10 DIAGNOSIS — G8929 Other chronic pain: Secondary | ICD-10-CM | POA: Diagnosis not present

## 2020-02-10 DIAGNOSIS — M25511 Pain in right shoulder: Secondary | ICD-10-CM

## 2020-02-10 DIAGNOSIS — M542 Cervicalgia: Secondary | ICD-10-CM | POA: Diagnosis not present

## 2020-02-10 DIAGNOSIS — R29898 Other symptoms and signs involving the musculoskeletal system: Secondary | ICD-10-CM | POA: Diagnosis not present

## 2020-02-10 NOTE — Patient Instructions (Signed)
Access Code: [email or web address] URL: [email or web address] Date: 02/10/2020 Prepared by: [name]  Exercises Standing Bilateral Low Shoulder Row with Anchored Resistance - 1 x daily - 7 x weekly - 1 sets - 15 reps (complete once with palm up and palms facing each other) Shoulder Extension with Resistance - 1 x daily - 7 x weekly - 1 sets - 15 reps Standing Row with Resistance with Anchored Resistance at Chest Height Palms Down - 1 x daily - 7 x weekly - 1 sets - 15 reps

## 2020-02-10 NOTE — Therapy (Signed)
[facility] [address] [place], [place], [address] Phone: [phone]   Fax:  [phone]  Occupational Therapy Treatment  Patient Details  Name: Tracy Flores MRN: [id number] Date of Birth: August 18, 1990 Referring Provider (OT): Dr. Darreld Mclean   Encounter Date: 02/10/2020   OT End of Session - 02/10/20 1257    Visit Number 4    Number of Visits 8    Date for OT Re-Evaluation 02/19/20    Authorization Type Medicaid-Healthy Blue    Authorization Time Period Approved 40 visits (02/23/20-02/23/20)    Authorization - Visit Number 4    Authorization - Number of Visits 40    OT Start Time 1030    OT Stop Time 1110    OT Time Calculation (min) 40 min    Activity Tolerance Patient tolerated treatment well    Behavior During Therapy [facility] for tasks assessed/performed           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   Subjective Assessment - 02/10/20 1036    Subjective  S: It wasn't too store yesterday.    Currently in Pain? Yes    Pain Score 1     Pain Location Neck    Pain Orientation Right    Pain Descriptors / Indicators Sore    Pain Type Chronic pain              [facility] OT Assessment - 02/10/20 1038       Assessment   Medical Diagnosis right shoulder and cervical pain      Precautions   Precautions None                    OT Treatments/Exercises (OP) - 02/10/20 1038      Exercises   Exercises Shoulder;Neck      Shoulder Exercises: Standing   Flexion Strengthening;15 reps   to shoulder level   Shoulder Flexion Weight (lbs) 2    Extension Theraband;15 reps    Theraband Level (Shoulder Extension) Level 2 (Red)    Row Theraband;15 reps   palm facing and palms up also   Theraband Level (Shoulder Row) Level 2 (Red)    Retraction Theraband;15 reps    Theraband Level (Shoulder Retraction) Level 2 (Red)    Other Standing Exercises Shoulder scaption to shoulder level; 2#; 15X    Other Standing Exercises Hammer curl to overhead press; 2#; 15X      Shoulder Exercises: ROM/Strengthening   UBE (Upper Arm Bike) Level 3 2' reverse 2' forward   pace: 6.0-7.0   Ball on Wall 1' abudction red weighted ball  Other ROM/Strengthening Exercises countertop push up plus; 15X      Modalities   Modalities Moist Heat      Moist Heat Therapy   Number Minutes Moist Heat 5 Minutes    Moist Heat Location Cervical;Shoulder                  OT Education - 02/10/20 1048    Education Details scapular strengthening for posture. HEP: red band for scapular strengthening    Person(s) Educated Patient    Methods Explanation;Demonstration;Handout    Comprehension Verbalized understanding;Returned demonstration            OT Short Term Goals - 02/03/20 1220      OT SHORT TERM GOAL #1   Title Pt will be provided with and educated on HEP to improve mobility in RUE required for ADL completion.    Time 4    Period Weeks    Status On-going    Target Date 02/19/20      OT SHORT TERM GOAL #2   Title Pt will decrease fascial restrictions in RUE and cervical neck to improve mobility required for functional reaching tasks.    Time 4    Period Weeks    Status On-going      OT SHORT TERM  GOAL #3   Title Pt will increase RUE A/ROM to WNL to improve ability to reach behind back during dressing and bathing.    Time 4    Period Weeks    Status On-going      OT SHORT TERM GOAL #4   Title Pt will decrease RUE and cervical neck pain to 3/10 or less to improve ability to sleep at night.    Time 4    Period Weeks    Status On-going      OT SHORT TERM GOAL #5   Title Pt will increase cervical neck ROM to [facility] to improve ability to turn head when driving.    Time 4    Period Weeks    Status On-going                    Plan - 02/10/20 1258    Clinical Impression Statement A: Continued to focus on scapular strengthening to increase postural control and strength. HEP was updated to include scapular strengthening with red band. VC for form and technique were provided throughout session. Heat applied at start of session to decrease fascial restrictions and muscle tightness.    Body Structure / Function / Physical Skills ADL;Endurance;UE functional use;Pain;Fascial restriction;ROM;IADL;Strength    Plan P: Follow up on HEP. Continue with scapular stability and strengthening.    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 [place], [place], [address] Phone: [phone]   Fax:  [phone]  Name: Tracy Flores MRN: [id number] Date of Birth: 1991-05-19

## 2020-02-14 DIAGNOSIS — F422 Mixed obsessional thoughts and acts: Secondary | ICD-10-CM | POA: Diagnosis not present

## 2020-02-14 DIAGNOSIS — F341 Dysthymic disorder: Secondary | ICD-10-CM | POA: Diagnosis not present

## 2020-02-14 DIAGNOSIS — F900 Attention-deficit hyperactivity disorder, predominantly inattentive type: Secondary | ICD-10-CM | POA: Diagnosis not present

## 2020-02-14 DIAGNOSIS — F411 Generalized anxiety disorder: Secondary | ICD-10-CM | POA: Diagnosis not present

## 2020-02-15 ENCOUNTER — Ambulatory Visit: Payer: Medicaid Other | Admitting: Orthopaedic Surgery

## 2020-02-15 ENCOUNTER — Telehealth (HOSPITAL_COMMUNITY): Payer: Self-pay | Admitting: Occupational Therapy

## 2020-02-15 ENCOUNTER — Encounter (HOSPITAL_COMMUNITY): Payer: Medicaid Other | Admitting: Occupational Therapy

## 2020-02-15 NOTE — Telephone Encounter (Signed)
pt cancelled appt for today because her arm was hurting

## 2020-02-16 DIAGNOSIS — F411 Generalized anxiety disorder: Secondary | ICD-10-CM | POA: Diagnosis not present

## 2020-02-16 DIAGNOSIS — F900 Attention-deficit hyperactivity disorder, predominantly inattentive type: Secondary | ICD-10-CM | POA: Diagnosis not present

## 2020-02-16 DIAGNOSIS — F341 Dysthymic disorder: Secondary | ICD-10-CM | POA: Diagnosis not present

## 2020-02-16 DIAGNOSIS — F422 Mixed obsessional thoughts and acts: Secondary | ICD-10-CM | POA: Diagnosis not present

## 2020-02-17 ENCOUNTER — Other Ambulatory Visit: Payer: Self-pay

## 2020-02-17 ENCOUNTER — Encounter (HOSPITAL_COMMUNITY): Payer: Self-pay

## 2020-02-17 ENCOUNTER — Ambulatory Visit (HOSPITAL_COMMUNITY): Payer: Medicaid Other

## 2020-02-17 DIAGNOSIS — R29898 Other symptoms and signs involving the musculoskeletal system: Secondary | ICD-10-CM

## 2020-02-17 DIAGNOSIS — M542 Cervicalgia: Secondary | ICD-10-CM | POA: Diagnosis not present

## 2020-02-17 DIAGNOSIS — M25511 Pain in right shoulder: Secondary | ICD-10-CM | POA: Diagnosis not present

## 2020-02-17 DIAGNOSIS — G8929 Other chronic pain: Secondary | ICD-10-CM | POA: Diagnosis not present

## 2020-02-17 NOTE — Therapy (Signed)
[facility] [address] [place], [place], [address] Phone: [phone]   Fax:  [phone]  Occupational Therapy Treatment  Patient Details  Name: LEONORE FRANKSON MRN: [id number] Date of Birth: 01/08/1991 Referring Provider (OT): Dr. Darreld Mclean   Encounter Date: 02/17/2020   OT End of Session - 02/17/20 1152    Visit Number 5    Number of Visits 8    Date for OT Re-Evaluation 02/19/20    Authorization Type Medicaid-Healthy Blue    Authorization Time Period Approved 40 visits (02/23/20-02/23/20)    Authorization - Visit Number 5    Authorization - Number of Visits 40    OT Start Time 1115    OT Stop Time 1153    OT Time Calculation (min) 38 min    Activity Tolerance Patient tolerated treatment well    Behavior During Therapy [facility] for tasks assessed/performed           Past Medical History:  Diagnosis Date  . Abnormal pap   . Acid reflux disease   . Anxiety   . History of gonorrhea 2013  . 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 IBS (irritable bowel syndrome)   . Interstitial cystitis   . Major depression   . OCD (obsessive compulsive disorder)   . Pregnancy   . Reactive airway disease   . Reflux   . Restless legs   . URI (upper respiratory infection)   . Vaginal Pap smear, abnormal     Past Surgical History:  Procedure Laterality Date  . CERVICAL CONIZATION W/BX N/A 09/21/2019   Procedure: COLD KNIFE CONIZATION CERVIX WITH BIOPSY;  Surgeon: Tilda Burrow, MD;  Location: [facility];  Service: Gynecology;  Laterality: N/A;  . NO PAST SURGERIES      There were no vitals filed for this visit.   Subjective Assessment - 02/17/20 1126    Currently in Pain? Yes    Pain Score 5     Pain Location Shoulder    Pain Orientation Right    Pain Descriptors / Indicators Sore    Pain Type Chronic pain    Pain Radiating Towards N/A    Pain Onset In the past 7 days    Pain Frequency Constant    Aggravating Factors   Frequent household tasks such as mopping, sweeping, sleeping comfortably.    Pain Relieving Factors naproxen when needed.    Effect of Pain on Daily Activities mod effect    Multiple Pain Sites No                        OT Treatments/Exercises (OP) - 02/17/20 1128      Exercises   Exercises Shoulder;Neck      Shoulder Exercises: Standing   Flexion Strengthening;15 reps   to shoulder level   Shoulder Flexion Weight (lbs) 2    Extension Theraband;15 reps    Theraband Level (Shoulder Extension) Level 2 (Red)    Row Theraband;15 reps   palms facing up and facing each other   Theraband Level (Shoulder Row) Level 2 (Red)    Retraction Theraband;15 reps    Theraband Level (Shoulder Retraction) Level 2 (Red)    Other Standing Exercises Shoulder scaption to shoulder level; 2#; 15X    Other Standing Exercises Hammer curl to overhead press; 2#; 15X      Shoulder Exercises: ROM/Strengthening   UBE (Upper Arm Bike) Level 3 2' reverse 2' forward    "  W" Arms 10X A/ROM    X to V Arms 10X A/ROM    [place] 1' abduction 1' flexion green weighted ball      Modalities   Modalities Moist Heat      Moist Heat Therapy   Number Minutes Moist Heat 5 Minutes    Moist Heat Location Cervical;Shoulder                    OT Short Term Goals - 02/03/20 1220      OT SHORT TERM GOAL #1   Title Pt will be provided with and educated on HEP to improve mobility in RUE required for ADL completion.    Time 4    Period Weeks    Status On-going    Target Date 02/19/20      OT SHORT TERM GOAL #2   Title Pt will decrease fascial restrictions in RUE and cervical neck to improve mobility required for functional reaching tasks.    Time 4    Period Weeks    Status On-going      OT SHORT TERM GOAL #3   Title Pt will increase RUE A/ROM to WNL to improve ability to reach behind back during dressing and bathing.    Time 4    Period Weeks    Status On-going      OT SHORT TERM GOAL  #4   Title Pt will decrease RUE and cervical neck pain to 3/10 or less to improve ability to sleep at night.    Time 4    Period Weeks    Status On-going      OT SHORT TERM GOAL #5   Title Pt will increase cervical neck ROM to [facility] to improve ability to turn head when driving.    Time 4    Period Weeks    Status On-going                    Plan - 02/17/20 1153    Clinical Impression Statement A: Patient reports slightly higher pain level in right arm this date. 6/10 pain left arm due to vaccine shot received yesterday.    Body Structure / Function / Physical Skills ADL;Endurance;UE functional use;Pain;Fascial restriction;ROM;IADL;Strength           Patient will benefit from skilled therapeutic intervention in order to improve the following deficits and impairments:   Body Structure / Function / Physical Skills: ADL, Endurance, UE functional use, Pain, Fascial restriction, ROM, IADL, Strength       Visit Diagnosis: Cervical pain (neck)  Other symptoms and signs involving the musculoskeletal system  Chronic right shoulder pain    Problem List Patient Active Problem List   Diagnosis Date Noted  . Anxiety and depression 01/21/2020  . Migraine, chronic, without aura 12/23/2019  . 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 Periodic limb movements of sleep 08/07/2019  . Furuncle of pubic region 07/07/2019  . Episodic tension-type headache, not intractable 04/30/2019  . ADD (attention deficit disorder) 03/26/2013  . Generalized anxiety disorder 11/13/2012  . Genital herpes 11/13/2012  . Nexplanon insertion 10/27/2012    [name], OTR/L,CBIS  [phone]   02/17/2020, 12:17 PM  [facility] [address] [place], [place], [address] Phone: [phone]   Fax:  [phone]  Name: RIVERLYN KIZZIAH MRN: [id number] Date of Birth: 04/26/1991

## 2020-02-21 ENCOUNTER — Encounter (HOSPITAL_COMMUNITY): Payer: Medicaid Other

## 2020-02-22 ENCOUNTER — Ambulatory Visit: Payer: Self-pay | Admitting: *Deleted

## 2020-02-22 ENCOUNTER — Other Ambulatory Visit: Payer: Self-pay

## 2020-02-22 NOTE — Patient Outreach (Signed)
Care Coordination  02/22/2020  Tracy Flores [birth year] [id number]    Transferred referral to this case manager from Elmer Picker RN  Placed initial outreach call to patient who confirms her identity. Explained reason for call. Patient reports to me that she is doing well.  Reports she is having a MRI of her neck in 4 days. States recent change in her depression medications and reports she is doing much better.  Reports she is taking her medications as prescribed without any difficulty.  Reports she continues to work with her therapist. Reports she has follow up planned with Dr. Danella Deis after MRI. Denies any transortation problems at this time.    Patient denies any concerns today that she feels like she needs help with.    Will plan to close case as patient denies any needs at this time. I offered to mail outreach letter with my contact information and patient agreed. Address confirmed.  PLAN: no needs identified .  Case closed.  Rowe Pavy, RN, BSN, CEN [facility] [employer] [phone]

## 2020-02-23 ENCOUNTER — Encounter (HOSPITAL_COMMUNITY): Payer: Self-pay

## 2020-02-23 ENCOUNTER — Ambulatory Visit (HOSPITAL_COMMUNITY): Payer: Medicaid Other

## 2020-02-23 ENCOUNTER — Other Ambulatory Visit: Payer: Self-pay

## 2020-02-23 DIAGNOSIS — F341 Dysthymic disorder: Secondary | ICD-10-CM | POA: Diagnosis not present

## 2020-02-23 DIAGNOSIS — G8929 Other chronic pain: Secondary | ICD-10-CM

## 2020-02-23 DIAGNOSIS — R29898 Other symptoms and signs involving the musculoskeletal system: Secondary | ICD-10-CM | POA: Diagnosis not present

## 2020-02-23 DIAGNOSIS — M25511 Pain in right shoulder: Secondary | ICD-10-CM | POA: Diagnosis not present

## 2020-02-23 DIAGNOSIS — F411 Generalized anxiety disorder: Secondary | ICD-10-CM | POA: Diagnosis not present

## 2020-02-23 DIAGNOSIS — M542 Cervicalgia: Secondary | ICD-10-CM | POA: Diagnosis not present

## 2020-02-23 DIAGNOSIS — F900 Attention-deficit hyperactivity disorder, predominantly inattentive type: Secondary | ICD-10-CM | POA: Diagnosis not present

## 2020-02-23 DIAGNOSIS — F422 Mixed obsessional thoughts and acts: Secondary | ICD-10-CM | POA: Diagnosis not present

## 2020-02-23 NOTE — Therapy (Signed)
[place], [place], [address] Phone: [phone]   Fax:  [phone]  Occupational Therapy Treatment Reassessment and discharge Patient Details  Name: Tracy Flores MRN: [id number] Date of Birth: 26-Apr-1991 Referring Provider (OT): Dr. Sanjuana Kava   Encounter Date: 02/23/2020   OT End of Session - 02/23/20 1718    Visit Number 6    Number of Visits 8    Authorization Type Medicaid-Healthy Blue    Authorization Time Period Approved 40 visits (02/23/20-02/23/20)    Authorization - Visit Number 6    Authorization - Number of Visits 40    OT Start Time 1600   reassess and discharge   OT Stop Time 1629    OT Time Calculation (min) 29 min    Activity Tolerance Patient tolerated treatment well    Behavior During Therapy [facility] for tasks assessed/performed           Past Medical History:  Diagnosis Date  . Abnormal pap   . Acid reflux disease   . Anxiety   . History of gonorrhea 2013  . 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 IBS (irritable bowel syndrome)   . Interstitial cystitis   . Major depression   . OCD (obsessive compulsive disorder)   . Pregnancy   . Reactive airway disease   . Reflux   . Restless legs   . URI (upper respiratory infection)   . Vaginal Pap smear, abnormal     Past Surgical History:  Procedure Laterality Date  . CERVICAL CONIZATION W/BX N/A 09/21/2019   Procedure: COLD KNIFE CONIZATION CERVIX WITH BIOPSY;  Surgeon: Jonnie Kind, MD;  Location: [facility];  Service: Gynecology;  Laterality: N/A;  . NO PAST SURGERIES      There were no vitals filed for this visit.   Subjective Assessment - 02/23/20 1717    Subjective  S: It's a little better. It still is sore and hurts.    Currently in Pain? Yes    Pain Score 3     Pain Location Shoulder    Pain Orientation Right    Pain Descriptors / Indicators Sore    Pain Type Chronic pain              [facility] OT  Assessment - 02/23/20 1606      Assessment   Medical Diagnosis right shoulder and cervical pain      Precautions   Precautions None      ROM / Strength   AROM / PROM / Strength AROM;PROM;Strength      AROM   Overall AROM Comments Assessed seated, er/IR adducted    AROM Assessment Site Shoulder;Cervical    Right/Left Shoulder Right    Right Shoulder Flexion 146 Degrees   previous: 140   Right Shoulder ABduction 129 Degrees   previous: 102   Right Shoulder Internal Rotation 90 Degrees   previous: same   Right Shoulder External Rotation 85 Degrees   previous: 80   Cervical Flexion 55   previous: 40   Cervical Extension 55   previous: 51 (WNL)   Cervical - Right Side Bend 62   previous: 21   Cervical - Left Side Bend 50   previous: 34   Cervical - Right Rotation 80   previous: 80   Cervical - Left Rotation 78   previous: 61     Strength   Overall Strength Comments Assessed seated, er/IR adducted    Strength  Assessment Site Shoulder    Right/Left Shoulder Right    Right Shoulder Flexion 5/5   previous: 4+/5   Right Shoulder ABduction 5/5   previous: 4+/5   Right Shoulder Internal Rotation 5/5   previous: 4+/5   Right Shoulder External Rotation 5/5   previous: 4+/5                   OT Treatments/Exercises (OP) - 02/23/20 1717      Exercises   Exercises Shoulder      Shoulder Exercises: Stretch   Wall Stretch - Flexion 2 reps;30 seconds    Wall Stretch - ABduction 2 reps;30 seconds    Other Shoulder Stretches Doorway stretch; 2x30"                  OT Education - 02/23/20 1718    Education Details shoulder stretches: flexion, abduction, doorway. Reviewed goals and progress in therapy.    Person(s) Educated Patient    Methods Explanation;Demonstration;Handout    Comprehension Verbalized understanding;Returned demonstration            OT Short Term Goals - 02/23/20 1617      OT SHORT TERM GOAL #1   Title Pt will be provided with and educated on  HEP to improve mobility in RUE required for ADL completion.    Time 4    Period Weeks    Status Achieved    Target Date 02/19/20      OT SHORT TERM GOAL #2   Title Pt will decrease fascial restrictions in RUE and cervical neck to improve mobility required for functional reaching tasks.    Time 4    Period Weeks    Status Achieved      OT SHORT TERM GOAL #3   Title Pt will increase RUE A/ROM to WNL to improve ability to reach behind back during dressing and bathing.    Time 4    Period Weeks    Status Achieved      OT SHORT TERM GOAL #4   Title Pt will decrease RUE and cervical neck pain to 3/10 or less to improve ability to sleep at night.    Baseline 8/25: Pt reports that pain hovers around a 3-4/10.    Time 4    Period Weeks    Status Partially Met      OT SHORT TERM GOAL #5   Title Pt will increase cervical neck ROM to [facility] to improve ability to turn head when driving.    Time 4    Period Weeks    Status Achieved                    Plan - 02/23/20 1719    Clinical Impression Statement A: reassessment completed this date. patient has met 4/5 therapy goals with one partially met. Cervical A/ROM is WNL, RUE shoulder ROM has improved and is [facility] with abduction being the most limited at this time. Patient's pain level has decreased slightly athough reports it stays between 3 and 4 on average. Overall shoulder strength is a 5/5. HEP was reviewed and updated. Planned MRI this week with patient following up with MD next week to review results.    Body Structure / Function / Physical Skills ADL;Endurance;UE functional use;Pain;Fascial restriction;ROM;IADL;Strength    Plan P: D/C from OT services with HEP. MRI this week and follow up with MD next week.    OT Home Exercise Plan eval: cervical neck stretches, scapular  strengthening with theraband. 8/25: shoulder stretches: doorway, flexion, abduction    Consulted and Agree with Plan of Care Patient           Patient will  benefit from skilled therapeutic intervention in order to improve the following deficits and impairments:   Body Structure / Function / Physical Skills: ADL, Endurance, UE functional use, Pain, Fascial restriction, ROM, IADL, Strength       Visit Diagnosis: Cervical pain (neck)  Other symptoms and signs involving the musculoskeletal system  Chronic right shoulder pain    Problem List Patient Active Problem List   Diagnosis Date Noted  . Anxiety and depression 01/21/2020  . Migraine, chronic, without aura 12/23/2019  . 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 Periodic limb movements of sleep 08/07/2019  . Furuncle of pubic region 07/07/2019  . Episodic tension-type headache, not intractable 04/30/2019  . ADD (attention deficit disorder) 03/26/2013  . Generalized anxiety disorder 11/13/2012  . Genital herpes 11/13/2012  . Nexplanon insertion 10/27/2012   OCCUPATIONAL THERAPY DISCHARGE SUMMARY  Visits from Start of Care: 6  Current functional level related to goals / functional outcomes: See above   Remaining deficits: See above   Education / Equipment: See above Plan: Patient agrees to discharge.  Patient goals were met. Patient is being discharged due to meeting the stated rehab goals.  ?????         Ailene Ravel, OTR/L,CBIS  [phone]  02/23/2020, 5:23 PM  [place], [place], [address] Phone: [phone]   Fax:  [phone]  Name: ZETHA KUHAR MRN: [id number] Date of Birth: Mar 03, 1991

## 2020-02-23 NOTE — Patient Instructions (Signed)
Complete the following exercises 1-3 times a day.  Doorway Stretch  Place each hand opposite each other on the doorway. (You can change where you feel the stretch by moving arms higher or lower.) Step through with one foot and bend front knee until a stretch is felt and hold. Step through with the opposite foot on the next rep. Hold for __30-60___ seconds. Repeat __2__times.      Wall Flexion  Slide your arm up the wall or door frame until a stretch is felt in your shoulder . Hold for 30-60 seconds. Complete 2 times     Shoulder Abduction Stretch  Stand side ways by a wall with affected up on wall. Gently step in toward wall to feel stretch. Hold for 30-60 seconds. Complete 2 times.

## 2020-02-26 ENCOUNTER — Ambulatory Visit
Admission: RE | Admit: 2020-02-26 | Discharge: 2020-02-26 | Disposition: A | Discharge status: Home / Self Care | Payer: Medicaid Other | Source: Ambulatory Visit | Attending: Orthopaedic Surgery | Admitting: Orthopaedic Surgery

## 2020-02-26 DIAGNOSIS — M542 Cervicalgia: Secondary | ICD-10-CM

## 2020-02-26 DIAGNOSIS — M4802 Spinal stenosis, cervical region: Secondary | ICD-10-CM | POA: Diagnosis not present

## 2020-02-29 ENCOUNTER — Other Ambulatory Visit: Payer: Self-pay

## 2020-02-29 ENCOUNTER — Encounter: Payer: Self-pay | Admitting: Orthopaedic Surgery

## 2020-02-29 ENCOUNTER — Ambulatory Visit (INDEPENDENT_AMBULATORY_CARE_PROVIDER_SITE_OTHER): Payer: Medicaid Other | Admitting: Orthopaedic Surgery

## 2020-02-29 VITALS — Ht 68.0 in

## 2020-02-29 DIAGNOSIS — M542 Cervicalgia: Secondary | ICD-10-CM

## 2020-02-29 NOTE — Progress Notes (Signed)
Patient Tracy Flores, female DOB:1990-08-31, 29 y.o. KXF:[id number]  Chief Complaint  Patient presents with  . Neck Pain    Go over MRI of neck    HPI  Tracy Flores is a 29 y.o. female who has neck pain and right sided paresthesias.  She had MRI done which showed: IMPRESSION: 1. Right foraminal disc osteophyte complex with superimposed right subarticular disc protrusion at C5-6. 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 2. Right foraminal disc osteophyte complex at C6-7 with resultant mild right C7 foraminal stenosis.  I have explained the findings to her.  I will have her see a neurosurgeon for further evaluation.  She is agreeable to this.   Body mass index is 27.83 kg/m.  ROS  Review of Systems  Constitutional: Positive for activity change.  Musculoskeletal: Positive for arthralgias and neck pain.  Psychiatric/Behavioral: The patient is nervous/anxious.   All other systems reviewed and are negative.   All other systems reviewed and are negative.  The following is a summary of the past history medically, past history surgically, known current medicines, social history and family history.  This information is gathered electronically by the computer from prior information and documentation.  I review this each visit and have found including this information at this point in the chart is beneficial and informative.    Past Medical History:  Diagnosis Date  . Abnormal pap   . Acid reflux disease   . Anxiety   . History of gonorrhea 2013  . 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 IBS (irritable bowel syndrome)   . Interstitial cystitis   . Major depression   . OCD (obsessive compulsive disorder)   . Pregnancy   . Reactive airway disease   . Reflux   . Restless legs   . URI (upper respiratory infection)   . Vaginal Pap smear, abnormal     Past Surgical History:  Procedure Laterality Date   . CERVICAL CONIZATION W/BX N/A 09/21/2019   Procedure: COLD KNIFE CONIZATION CERVIX WITH BIOPSY;  Surgeon: Tilda Burrow, MD;  Location: [facility];  Service: Gynecology;  Laterality: N/A;  . NO PAST SURGERIES      Family History  Problem Relation Age of Onset  . Depression Mother   . Bipolar disorder Sister   . Drug abuse Paternal Aunt   . Cancer Paternal Grandmother   . Hypertension Paternal Grandmother   . Diabetes Paternal Grandmother   . Arthritis Paternal Grandmother   . Heart disease Paternal Grandmother     Social History Social History   Tobacco Use  . Smoking status: Current Every Day Smoker    Packs/day: 0.50    Years: 5.00    Pack years: 2.50    Types: Cigarettes  . Smokeless tobacco: Never Used  Vaping Use  . Vaping Use: Never used  Substance Use Topics  . Alcohol use: No  . Drug use: No    Allergies  Allergen Reactions  . Abilify [Aripiprazole]     Restless legs  . Celexa [Citalopram Hydrobromide]     Agitation   . Clindamycin/Lincomycin   . Penicillins Other (See Comments)    Unknown Childhood reaction  . Zoloft [Sertraline Hcl]     Suicidal thoughts  . Amoxil [Amoxicillin] Rash    Current Outpatient Medications  Medication Sig Dispense Refill  . ADDERALL XR 30 MG 24 hr capsule Take 30 mg by mouth every morning.    Marland Kitchen  etonogestrel (NEXPLANON) 68 MG IMPL implant 68 mg by Subdermal route once.     . hydrOXYzine (VISTARIL) 25 MG capsule Take 1 capsule (25 mg total) by mouth at bedtime as needed (imsomnia). (Patient taking differently: Take 25 mg by mouth at bedtime as needed (imsomnia). Per patient report taking TID as needed) 30 capsule 2  . naproxen (NAPROSYN) 500 MG tablet Take 1 tablet (500 mg total) by mouth 2 (two) times daily with a meal. 20 tablet 0  . ondansetron (ZOFRAN ODT) 8 MG disintegrating tablet Take 1.5 tablets (12 mg total) by mouth every 8 (eight) hours as needed for nausea or vomiting. Take TID PRN nausea 12 tablet 2  . SUMAtriptan  (IMITREX) 50 MG tablet Take one tablet po at first sign of headache, may repeat 2 hrs later 12 tablet 5  . valACYclovir (VALTREX) 1000 MG tablet TAKE ONE TABLET ONCE DAILY 30 tablet 6  . venlafaxine (EFFEXOR) 75 MG tablet Take 75 mg by mouth 2 (two) times daily.     No current facility-administered medications for this visit.     Physical Exam  Height 5\' 8"  (1.727 m).  Constitutional: overall normal hygiene, normal nutrition, well developed, normal grooming, normal body habitus. Assistive device:none  Musculoskeletal: gait and station Limp none, muscle tone and strength are normal, no tremors or atrophy is present.  .  Neurological: coordination overall normal.  Deep tendon reflex/nerve stretch intact.  Sensation normal.  Cranial nerves II-XII intact.   Skin:   Normal overall no scars, lesions, ulcers or rashes. No psoriasis.  Psychiatric: Alert and oriented x 3.  Recent memory intact, remote memory unclear.  Normal mood and affect. Well groomed.  Good eye contact.  Cardiovascular: overall no swelling, no varicosities, no edema bilaterally, normal temperatures of the legs and arms, no clubbing, cyanosis and good capillary refill.  Lymphatic: palpation is normal.  All other systems reviewed and are negative   The patient has been educated about the nature of the problem(s) and counseled on treatment options.  The patient appeared to understand what I have discussed and is in agreement with it.  Encounter Diagnosis  Name Primary?  . Neck pain Yes    PLAN Call if any problems.  Precautions discussed.  Continue current medications.   Return to clinic to neurosurgeon   Electronically Signed , MD 8/31/202110:07 AM

## 2020-03-07 DIAGNOSIS — F411 Generalized anxiety disorder: Secondary | ICD-10-CM | POA: Diagnosis not present

## 2020-03-07 DIAGNOSIS — F341 Dysthymic disorder: Secondary | ICD-10-CM | POA: Diagnosis not present

## 2020-03-07 DIAGNOSIS — F422 Mixed obsessional thoughts and acts: Secondary | ICD-10-CM | POA: Diagnosis not present

## 2020-03-07 DIAGNOSIS — F900 Attention-deficit hyperactivity disorder, predominantly inattentive type: Secondary | ICD-10-CM | POA: Diagnosis not present

## 2020-03-08 ENCOUNTER — Telehealth: Payer: Self-pay | Admitting: Orthopaedic Surgery

## 2020-03-08 DIAGNOSIS — F900 Attention-deficit hyperactivity disorder, predominantly inattentive type: Secondary | ICD-10-CM | POA: Diagnosis not present

## 2020-03-08 DIAGNOSIS — F422 Mixed obsessional thoughts and acts: Secondary | ICD-10-CM | POA: Diagnosis not present

## 2020-03-08 DIAGNOSIS — F411 Generalized anxiety disorder: Secondary | ICD-10-CM | POA: Diagnosis not present

## 2020-03-08 DIAGNOSIS — F341 Dysthymic disorder: Secondary | ICD-10-CM | POA: Diagnosis not present

## 2020-03-08 NOTE — Telephone Encounter (Signed)
Tracy Flores called and stated that she was told if she had not heard from the neurosurgeon in a week to call and let us know.  She said that she hasnt heard from them and wanted [place] to check on the referral.   Can you do this for her?  Thanks

## 2020-03-08 NOTE — Telephone Encounter (Signed)
Called the patient and left a brief message with the number to the neurosurgeon office. She was asked to call back with any questions.

## 2020-03-13 DIAGNOSIS — F900 Attention-deficit hyperactivity disorder, predominantly inattentive type: Secondary | ICD-10-CM | POA: Diagnosis not present

## 2020-03-13 DIAGNOSIS — F341 Dysthymic disorder: Secondary | ICD-10-CM | POA: Diagnosis not present

## 2020-03-13 DIAGNOSIS — F411 Generalized anxiety disorder: Secondary | ICD-10-CM | POA: Diagnosis not present

## 2020-03-13 DIAGNOSIS — F422 Mixed obsessional thoughts and acts: Secondary | ICD-10-CM | POA: Diagnosis not present

## 2020-03-15 DIAGNOSIS — M12819 Other specific arthropathies, not elsewhere classified, unspecified shoulder: Secondary | ICD-10-CM | POA: Diagnosis not present

## 2020-03-15 DIAGNOSIS — M5412 Radiculopathy, cervical region: Secondary | ICD-10-CM | POA: Diagnosis not present

## 2020-03-22 DIAGNOSIS — F341 Dysthymic disorder: Secondary | ICD-10-CM | POA: Diagnosis not present

## 2020-03-22 DIAGNOSIS — F422 Mixed obsessional thoughts and acts: Secondary | ICD-10-CM | POA: Diagnosis not present

## 2020-03-22 DIAGNOSIS — F900 Attention-deficit hyperactivity disorder, predominantly inattentive type: Secondary | ICD-10-CM | POA: Diagnosis not present

## 2020-03-22 DIAGNOSIS — F411 Generalized anxiety disorder: Secondary | ICD-10-CM | POA: Diagnosis not present

## 2020-03-23 DIAGNOSIS — M5412 Radiculopathy, cervical region: Secondary | ICD-10-CM | POA: Diagnosis not present

## 2020-03-23 DIAGNOSIS — Z6827 Body mass index (BMI) 27.0-27.9, adult: Secondary | ICD-10-CM | POA: Diagnosis not present

## 2020-04-03 DIAGNOSIS — F411 Generalized anxiety disorder: Secondary | ICD-10-CM | POA: Diagnosis not present

## 2020-04-03 DIAGNOSIS — F422 Mixed obsessional thoughts and acts: Secondary | ICD-10-CM | POA: Diagnosis not present

## 2020-04-03 DIAGNOSIS — F341 Dysthymic disorder: Secondary | ICD-10-CM | POA: Diagnosis not present

## 2020-04-03 DIAGNOSIS — F900 Attention-deficit hyperactivity disorder, predominantly inattentive type: Secondary | ICD-10-CM | POA: Diagnosis not present

## 2020-04-04 DIAGNOSIS — F900 Attention-deficit hyperactivity disorder, predominantly inattentive type: Secondary | ICD-10-CM | POA: Diagnosis not present

## 2020-04-04 DIAGNOSIS — F422 Mixed obsessional thoughts and acts: Secondary | ICD-10-CM | POA: Diagnosis not present

## 2020-04-04 DIAGNOSIS — F341 Dysthymic disorder: Secondary | ICD-10-CM | POA: Diagnosis not present

## 2020-04-04 DIAGNOSIS — F411 Generalized anxiety disorder: Secondary | ICD-10-CM | POA: Diagnosis not present

## 2020-04-26 DIAGNOSIS — F900 Attention-deficit hyperactivity disorder, predominantly inattentive type: Secondary | ICD-10-CM | POA: Diagnosis not present

## 2020-04-26 DIAGNOSIS — F341 Dysthymic disorder: Secondary | ICD-10-CM | POA: Diagnosis not present

## 2020-04-26 DIAGNOSIS — F411 Generalized anxiety disorder: Secondary | ICD-10-CM | POA: Diagnosis not present

## 2020-04-26 DIAGNOSIS — F422 Mixed obsessional thoughts and acts: Secondary | ICD-10-CM | POA: Diagnosis not present

## 2020-05-03 DIAGNOSIS — F341 Dysthymic disorder: Secondary | ICD-10-CM | POA: Diagnosis not present

## 2020-05-03 DIAGNOSIS — F422 Mixed obsessional thoughts and acts: Secondary | ICD-10-CM | POA: Diagnosis not present

## 2020-05-03 DIAGNOSIS — F411 Generalized anxiety disorder: Secondary | ICD-10-CM | POA: Diagnosis not present

## 2020-05-03 DIAGNOSIS — F900 Attention-deficit hyperactivity disorder, predominantly inattentive type: Secondary | ICD-10-CM | POA: Diagnosis not present

## 2020-05-09 DIAGNOSIS — M5412 Radiculopathy, cervical region: Secondary | ICD-10-CM | POA: Diagnosis not present

## 2020-05-09 DIAGNOSIS — Z6827 Body mass index (BMI) 27.0-27.9, adult: Secondary | ICD-10-CM | POA: Diagnosis not present

## 2020-05-09 DIAGNOSIS — R030 Elevated blood-pressure reading, without diagnosis of hypertension: Secondary | ICD-10-CM | POA: Diagnosis not present

## 2020-05-10 ENCOUNTER — Other Ambulatory Visit: Payer: Self-pay

## 2020-05-10 ENCOUNTER — Ambulatory Visit (INDEPENDENT_AMBULATORY_CARE_PROVIDER_SITE_OTHER): Payer: Medicaid Other | Admitting: Advanced Practice Midwife

## 2020-05-10 ENCOUNTER — Encounter: Payer: Self-pay | Admitting: Advanced Practice Midwife

## 2020-05-10 VITALS — BP 119/72 | HR 79 | Ht 68.0 in | Wt 182.0 lb

## 2020-05-10 DIAGNOSIS — Z3046 Encounter for surveillance of implantable subdermal contraceptive: Secondary | ICD-10-CM | POA: Diagnosis not present

## 2020-05-10 MED ORDER — NORETHIN-ETH ESTRAD-FE BIPHAS 1 MG-10 MCG / 10 MCG PO TABS: 1.0000 | ORAL_TABLET | Freq: Every day | ORAL | 4 refills | Status: DC

## 2020-05-10 NOTE — Progress Notes (Signed)
   NEXPLANON REMOVAL Patient name: Tracy Flores MRN [id number]  Date of birth: 28-Nov-1990 Subjective Findings:   Tracy Flores is a 29 y.o. G51P1001 Caucasian female being seen today for removal of a Nexplanon. Her Nexplanon was placed in Dec 2020.  She desires removal because of the heavy bleeding q month. Tried Megace without success. Signed copy of informed consent in chart.   Patient's last menstrual period was 05/08/2020 (exact date). Last pap May 2021. Results were:  normal, but has hx of abnl so needs annually The planned method of family planning is OCP (estrogen/progesterone) Depression screen [facility] 2/9 01/20/2020 04/23/2019 07/10/2018 06/04/2018 05/25/2018  Decreased Interest 3 2 1 1 1   Down, Depressed, Hopeless 3 2 1 2 1   PHQ - 2 Score 6 4 2 3 2   Altered sleeping 2 2 0 1 0  Tired, decreased energy 3 2 1 2 2   Change in appetite 3 0 0 0 0  Feeling bad or failure about yourself  3 1 2 2 1   Trouble concentrating - 1 0 2 0  Moving slowly or fidgety/restless 2 2 1 2 2   Suicidal thoughts 0 0 0 0 0  PHQ-9 Score 19 12 6 12 7   Difficult doing work/chores Extremely dIfficult Somewhat difficult Somewhat difficult Somewhat difficult Somewhat difficult    Pertinent History Reviewed:   Reviewed past medical,surgical, social, obstetrical and family history.  Reviewed problem list, medications and allergies. Objective Findings & Procedure:    Vitals:   05/10/20 1535  BP: 119/72  Pulse: 79  Weight: 182 lb (82.6 kg)  Height: 5\' 8"  (1.727 m)  Body mass index is 27.67 kg/m.  No results found for this or any previous visit (from the past 24 hour(s)).   Time out was performed.  Nexplanon site identified.  Area prepped in usual sterile fashon. One cc of 2% lidocaine was used to anesthetize the area at the distal end of the implant. A small stab incision was made right beside the implant on the distal portion.  The Nexplanon rod was grasped using hemostats and removed without difficulty.   There was less than 3 cc blood loss. There were no complications.  Steri-strips were applied over the small incision and a pressure bandage was applied.  The patient tolerated the procedure well. Assessment & Plan:   1) Nexplanon removal She was instructed to keep the area clean and dry, remove pressure bandage in 24 hours, and keep insertion site covered with the steri-strip for 3-5 days.   Follow-up PRN problems.  2) New start OCPs, sent in LoLoestrin to start tomorrow  No orders of the defined types were placed in this encounter.   Follow-up: Return in about 3 months (around 08/10/2020) for contraception f/u.  CNM 05/10/2020 4:18 PM

## 2020-05-10 NOTE — Patient Instructions (Signed)

## 2020-05-16 DIAGNOSIS — F422 Mixed obsessional thoughts and acts: Secondary | ICD-10-CM | POA: Diagnosis not present

## 2020-05-16 DIAGNOSIS — F411 Generalized anxiety disorder: Secondary | ICD-10-CM | POA: Diagnosis not present

## 2020-05-16 DIAGNOSIS — F900 Attention-deficit hyperactivity disorder, predominantly inattentive type: Secondary | ICD-10-CM | POA: Diagnosis not present

## 2020-05-16 DIAGNOSIS — F341 Dysthymic disorder: Secondary | ICD-10-CM | POA: Diagnosis not present

## 2020-05-17 DIAGNOSIS — F341 Dysthymic disorder: Secondary | ICD-10-CM | POA: Diagnosis not present

## 2020-05-17 DIAGNOSIS — F411 Generalized anxiety disorder: Secondary | ICD-10-CM | POA: Diagnosis not present

## 2020-05-17 DIAGNOSIS — F422 Mixed obsessional thoughts and acts: Secondary | ICD-10-CM | POA: Diagnosis not present

## 2020-05-17 DIAGNOSIS — F900 Attention-deficit hyperactivity disorder, predominantly inattentive type: Secondary | ICD-10-CM | POA: Diagnosis not present

## 2020-06-20 DIAGNOSIS — F422 Mixed obsessional thoughts and acts: Secondary | ICD-10-CM | POA: Diagnosis not present

## 2020-06-20 DIAGNOSIS — F411 Generalized anxiety disorder: Secondary | ICD-10-CM | POA: Diagnosis not present

## 2020-06-20 DIAGNOSIS — F341 Dysthymic disorder: Secondary | ICD-10-CM | POA: Diagnosis not present

## 2020-06-20 DIAGNOSIS — F900 Attention-deficit hyperactivity disorder, predominantly inattentive type: Secondary | ICD-10-CM | POA: Diagnosis not present

## 2020-06-29 ENCOUNTER — Other Ambulatory Visit: Payer: Self-pay

## 2020-06-29 ENCOUNTER — Encounter: Payer: Self-pay | Admitting: Nurse Practitioner

## 2020-06-29 ENCOUNTER — Ambulatory Visit: Payer: Medicaid Other | Admitting: Nurse Practitioner

## 2020-06-29 VITALS — BP 124/88 | HR 103 | Temp 98.2°F | Wt 188.0 lb

## 2020-06-29 DIAGNOSIS — F419 Anxiety disorder, unspecified: Secondary | ICD-10-CM | POA: Diagnosis not present

## 2020-06-29 DIAGNOSIS — Z23 Encounter for immunization: Secondary | ICD-10-CM | POA: Diagnosis not present

## 2020-06-29 DIAGNOSIS — F32A Depression, unspecified: Secondary | ICD-10-CM | POA: Diagnosis not present

## 2020-06-29 NOTE — Progress Notes (Signed)
   Subjective:    Patient ID: Tracy Flores, female    DOB: 1991/06/26, 29 y.o.   MRN: [id number]  HPI Patient comes in to follow up on anxiety and depression.  Is being followed by psychiatry.  Her Effexor was recently increased to 100 mg twice daily.  Started on guanfacine 1 mg at bedtime for sleep.  Was started on BuSpar but this has not been effective in the past.  Has an appointment for follow-up sometime in January.  Denies any suicidal or homicidal thoughts or ideation.  Is currently not taking her Adderall XR.  States this was increasing her anxiety.  It was decided to hold off on any stimulants for ADD at this time.  Her psychiatrist will be handling this. Depression screen [facility] 2/9 06/29/2020 01/20/2020 04/23/2019 07/10/2018 06/04/2018  Decreased Interest 2 3 2 1 1   Down, Depressed, Hopeless 2 3 2 1 2   PHQ - 2 Score 4 6 4 2 3   Altered sleeping 3 2 2  0 1  Tired, decreased energy 3 3 2 1 2   Change in appetite 3 3 0 0 0  Feeling bad or failure about yourself  2 3 1 2 2   Trouble concentrating 3 - 1 0 2  Moving slowly or fidgety/restless 3 2 2 1 2   Suicidal thoughts 0 0 0 0 0  PHQ-9 Score 21 19 12 6 12   Difficult doing work/chores Extremely dIfficult Extremely dIfficult Somewhat difficult Somewhat difficult Somewhat difficult   GAD 7 : Generalized Anxiety Score 06/30/2020 01/20/2020 04/23/2019 07/10/2018  Nervous, Anxious, on Edge 3 3 3 1   Control/stop worrying 3 3 3 1   Worry too much - different things 3 3 3  0  Trouble relaxing 3 3 3 1   Restless 3 3 3 1   Easily annoyed or irritable 3 3 3 2   Afraid - awful might happen 0 2 1 1   Total GAD 7 Score 18 20 19 7   Anxiety Difficulty Extremely difficult Extremely difficult Somewhat difficult Somewhat difficult    Patient denies any suicidal or homicidal thoughts or ideation.  States her grandmother wanted her to come and talk to me regarding my thoughts on her medications.    Review of Systems     Objective:   Physical Exam NAD.   Alert, oriented.  Calm affect.  Making good eye contact.  Thoughts logical coherent and relevant.  Dressed appropriately.  Speech clear.  Lungs clear.  Heart regular rate and rhythm. Today's Vitals   06/29/20 1118  BP: 124/88  Pulse: (!) 103  Temp: 98.2 F (36.8 C)  SpO2: 94%  Weight: 188 lb (85.3 kg)   Body mass index is 28.59 kg/m.     Assessment & Plan:   Problem List Items Addressed This Visit      Other   Anxiety and depression - Primary   Relevant Medications   venlafaxine (EFFEXOR) 100 MG tablet    Other Visit Diagnoses    Need for vaccination       Relevant Orders   Flu Vaccine QUAD 6+ mos PF IM (Fluarix Quad PF) (Completed)     Continue current meds as directed per psychiatry.  Reassured patient that psychiatry is starting a low dose with slow titration and regular follow-up.  Recommend she continue her current regimen and follow-up as planned. Seek help immediately if any major problems.

## 2020-06-29 NOTE — Patient Instructions (Signed)
Trazodone for sleep

## 2020-06-30 ENCOUNTER — Encounter: Payer: Self-pay | Admitting: Nurse Practitioner

## 2020-08-16 ENCOUNTER — Ambulatory Visit: Payer: Medicaid Other | Admitting: Advanced Practice Midwife

## 2020-10-27 ENCOUNTER — Ambulatory Visit: Payer: Medicaid Other | Admitting: Nurse Practitioner

## 2020-12-13 IMAGING — MR MR CERVICAL SPINE W/O CM
5 series · 28 of 48 positions shown · non-contrast
Comparison: Prior radiograph from 12/23/2019.

CLINICAL DATA: Initial evaluation for neck pain with radiation into
the right shoulder.

EXAM:
MRI CERVICAL SPINE WITHOUT CONTRAST
TECHNIQUE: Multiplanar, multisequence MR imaging of the cervical spine was
performed. No intravenous contrast was administered.

[Series 3: T2 · sagittal · 3.0mm · 0.66mm/px · 6 of 12 slices shown (1 of 2)]
[im 1/12]
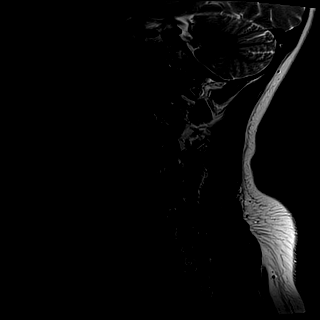
[im 3/12]
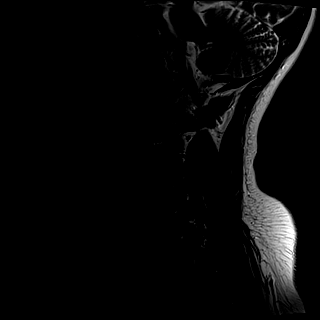
[im 5/12]
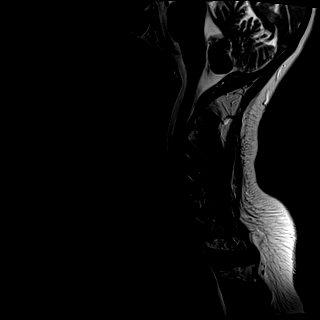
[im 7/12]
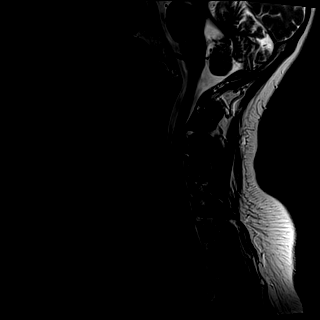
[im 9/12]
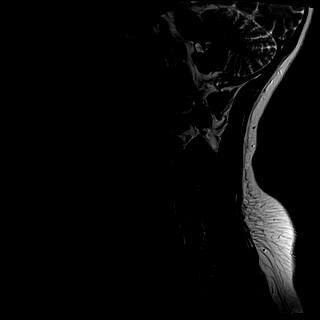
[im 12/12]
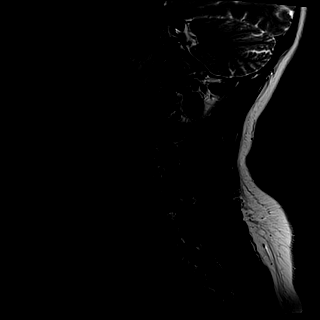

[Series 4: T1 · sagittal · 3.0mm · 0.41mm/px · 6 of 12 slices shown]
[im 1/12]
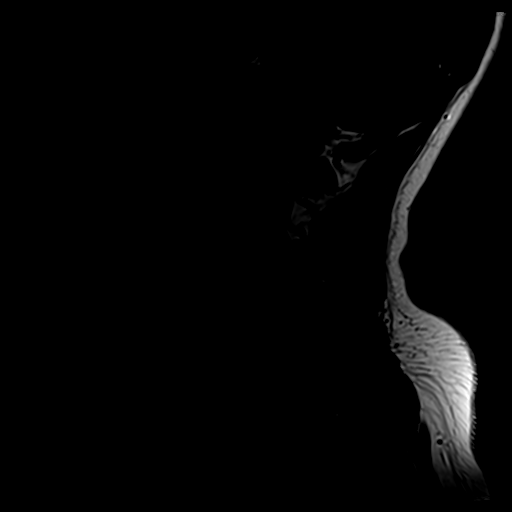
[im 3/12]
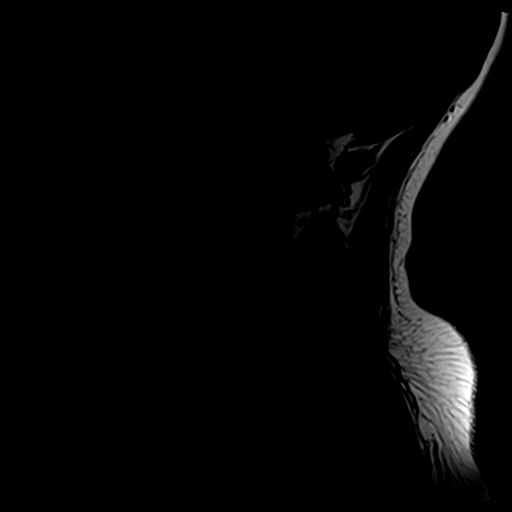
[im 5/12]
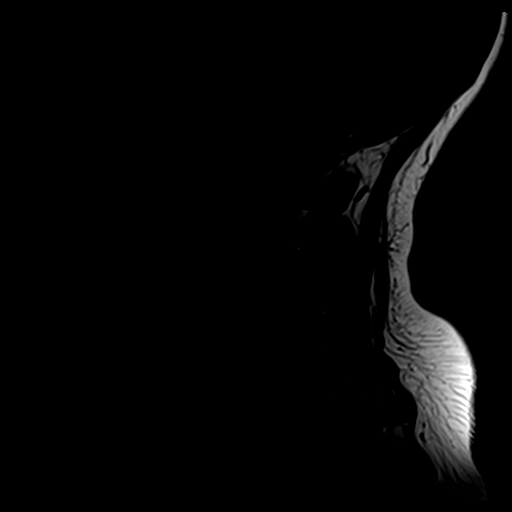
[im 7/12]
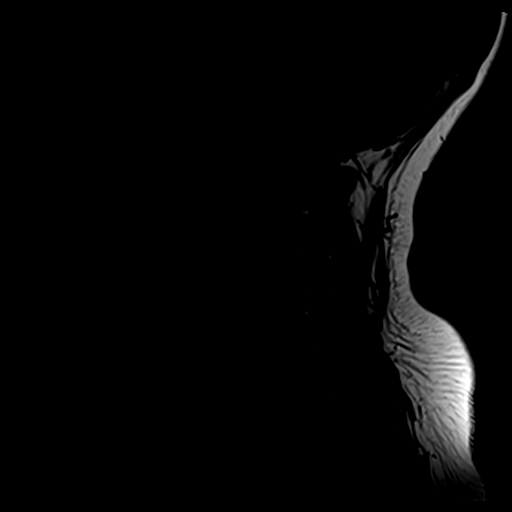
[im 9/12]
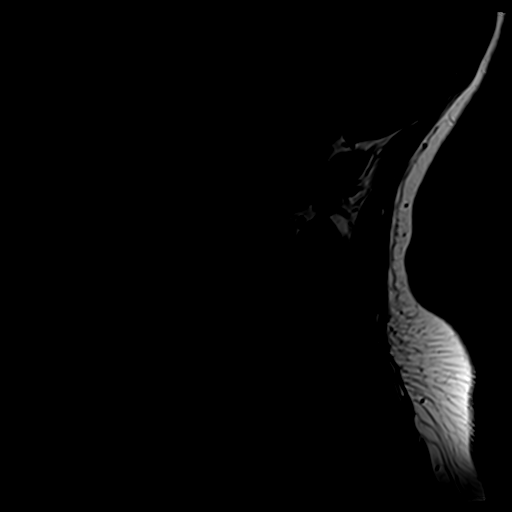
[im 12/12]
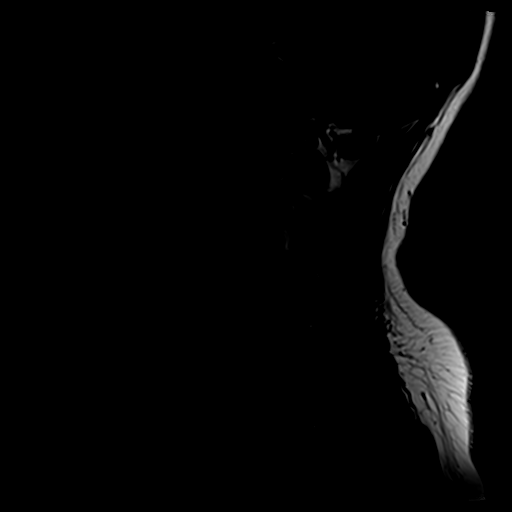

[Series 5: tir sag · sagittal · 3.0mm · 0.41mm/px · 6 of 12 slices shown]
[im 1/12]
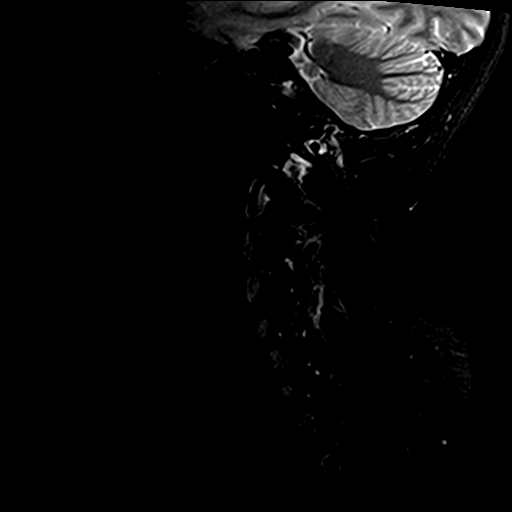
[im 3/12]
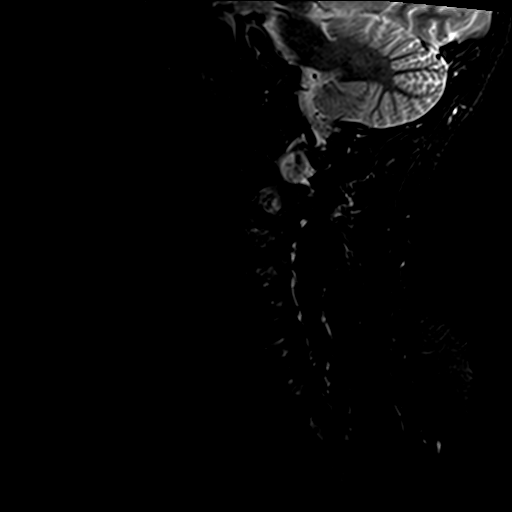
[im 5/12]
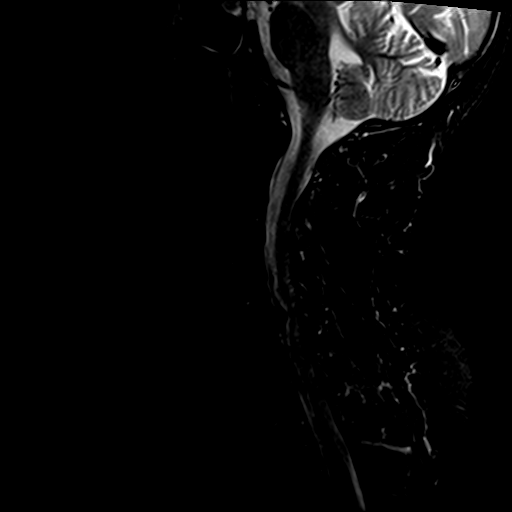
[im 7/12]
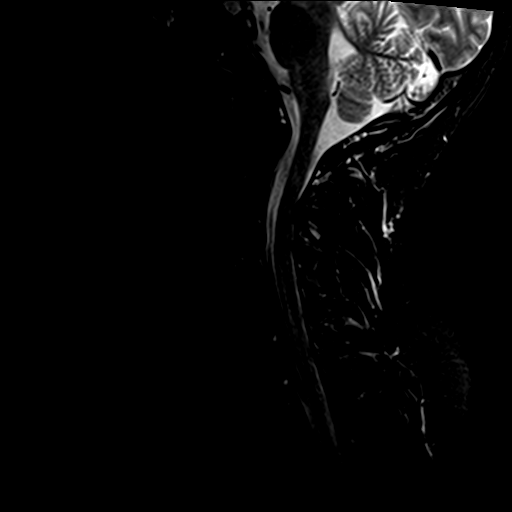
[im 9/12]
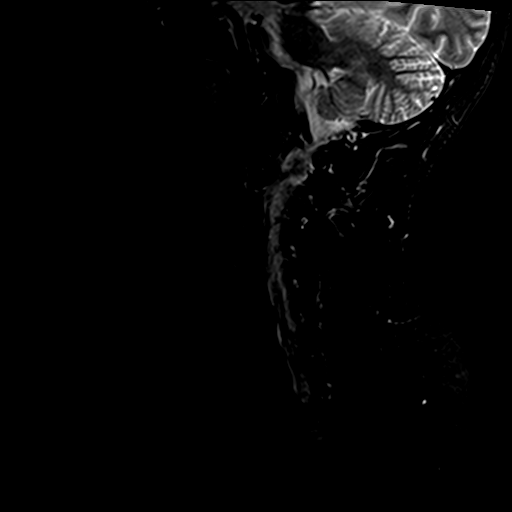
[im 12/12]
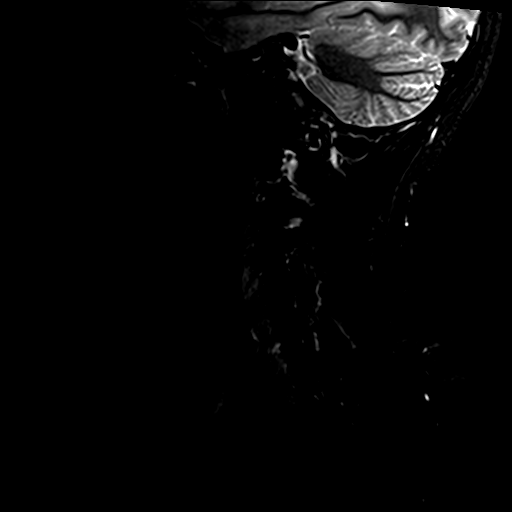

[Series 6: GRE · axial · 3.0mm · 0.35mm/px · 1 of 28 slices shown]
[im 1/28]
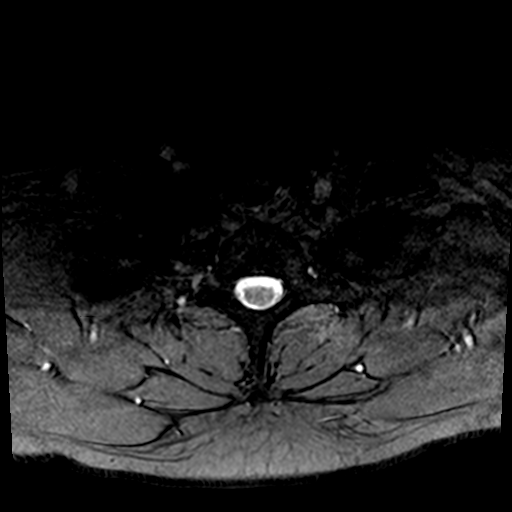

[Series 7: T2 · axial · 3.0mm · 0.70mm/px · z∈[-72,+26]mm · 9 of 28 slices shown (2 of 2)]
[im 1/28]
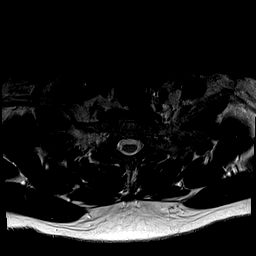
[im 4/28]
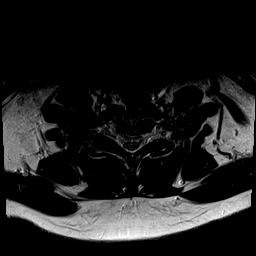
[im 8/28]
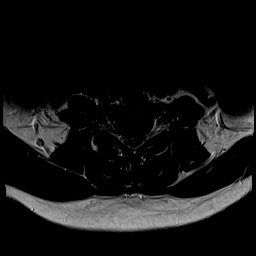
[im 12/28]
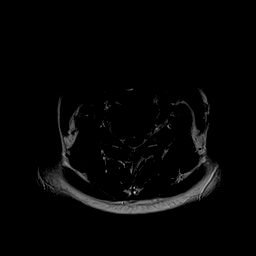
[im 14/28]
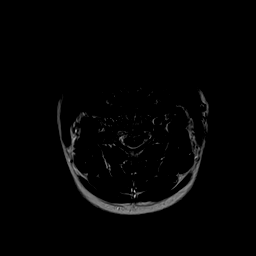
[im 16/28]
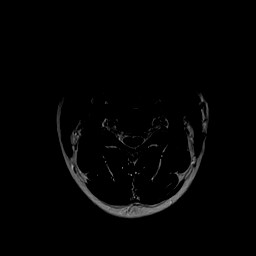
[im 20/28]
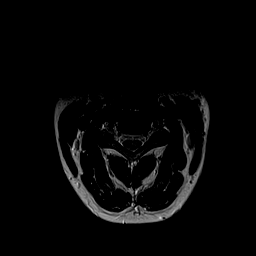
[im 24/28]
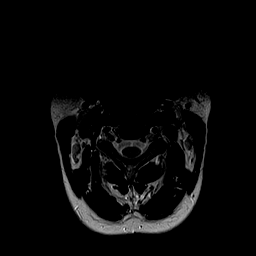
[im 28/28]
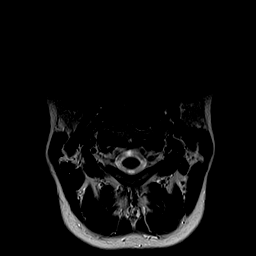

[28 of 48 positions shown; findings below may reference images not displayed]

FINDINGS: Alignment: Mild straightening of the normal cervical lordosis with
underlying trace levoscoliosis. No listhesis.

Vertebrae: Vertebral body height maintained without acute or chronic
fracture. Bone marrow signal intensity within normal limits. No
discrete or worrisome osseous lesions. No abnormal marrow edema.

Cord: Normal signal and morphology.

Posterior Fossa, vertebral arteries, paraspinal tissues: Visualized
brain and posterior fossa within normal limits. Craniocervical
junction normal. Paraspinous and prevertebral soft tissues within
normal limits. Normal intravascular flow voids seen within the
vertebral arteries bilaterally.

Disc levels:

C2-C3: Unremarkable.

C3-C4:  Minimal disc bulge.  No canal or foraminal stenosis.

C4-C5:  Unremarkable.

C5-C6: Right foraminal disc osteophyte complex with resultant
moderate to severe right C6 foraminal stenosis. Superimposed right
subarticular disc protrusion with slight inferior migration (series
6, image 17). Mild flattening of the right ventral thecal sac and
right ventral cord. Mild spinal stenosis. Left neural foramen
remains patent.

C6-C7: Mild disc bulge. Right foraminal disc osteophyte complex
mildly encroaches upon the right lateral recess with resultant mild
right C7 foraminal narrowing. No canal or left foraminal stenosis.

C7-T1: Mild right-sided facet hypertrophy. Otherwise unremarkable.
No stenosis.

Visualized upper thoracic spine demonstrates no significant finding.
IMPRESSION: 1. Right foraminal disc osteophyte complex with superimposed right
subarticular disc protrusion at C5-6. Resultant mild right-sided
spinal stenosis with moderate to severe right C6 foraminal stenosis.
2. Right foraminal disc osteophyte complex at C6-7 with resultant
mild right C7 foraminal stenosis.

## 2020-12-17 ENCOUNTER — Other Ambulatory Visit: Payer: Self-pay

## 2020-12-17 ENCOUNTER — Encounter (HOSPITAL_COMMUNITY): Payer: Self-pay

## 2020-12-17 ENCOUNTER — Emergency Department (HOSPITAL_COMMUNITY)
Admission: EM | Admit: 2020-12-17 | Discharge: 2020-12-17 | Disposition: A | Discharge status: Home / Self Care | Payer: Medicaid Other | Attending: Emergency Medicine | Admitting: Emergency Medicine

## 2020-12-17 ENCOUNTER — Emergency Department (HOSPITAL_COMMUNITY): Payer: Medicaid Other

## 2020-12-17 DIAGNOSIS — S2242XA Multiple fractures of ribs, left side, initial encounter for closed fracture: Secondary | ICD-10-CM | POA: Insufficient documentation

## 2020-12-17 DIAGNOSIS — W1839XA Other fall on same level, initial encounter: Secondary | ICD-10-CM | POA: Insufficient documentation

## 2020-12-17 DIAGNOSIS — J45909 Unspecified asthma, uncomplicated: Secondary | ICD-10-CM | POA: Diagnosis not present

## 2020-12-17 DIAGNOSIS — F17210 Nicotine dependence, cigarettes, uncomplicated: Secondary | ICD-10-CM | POA: Diagnosis not present

## 2020-12-17 DIAGNOSIS — S299XXA Unspecified injury of thorax, initial encounter: Secondary | ICD-10-CM | POA: Diagnosis present

## 2020-12-17 MED ORDER — IBUPROFEN 400 MG PO TABS
400.0000 mg | ORAL_TABLET | Freq: Once | ORAL | Status: AC
  Administered 2020-12-17: 05:00:00 400 mg via ORAL
  Filled 2020-12-17: qty 1

## 2020-12-17 MED ORDER — HYDROCODONE-ACETAMINOPHEN 5-325 MG PO TABS: 2.0000 | ORAL_TABLET | ORAL | 0 refills | Status: DC | PRN

## 2020-12-17 NOTE — Discharge Instructions (Addendum)
Take the pain medication as prescribed.  Use the incentive spirometer to encourage her self to take deep breaths.  Do not take the pain medication if you are driving or operating machinery.  Follow-up with your doctor for recheck this week.  Return to the ED with new or worsening symptoms

## 2020-12-17 NOTE — ED Triage Notes (Signed)
Pt reports falling around midnight landing on wooden chest. Pt reports pain to left rib cage. No LOC, denies hitting head.

## 2020-12-17 NOTE — ED Provider Notes (Signed)
[facility] EMERGENCY DEPARTMENT Provider Note   CSN: [id number] Arrival date & time: 12/17/20  0310     History Chief Complaint  Patient presents with   Fall    Left rib pain    Tracy Flores is a 30 y.o. female.  Patient with left-sided rib pain after falling around midnight.  States she tripped and hit her side against a wooden chest.  Did not hit her head or lose consciousness.  Complains of pain to her left ribs and pain with inspiration and some shortness of breath.  Did not take any medication at home for the pain.  Has pain when she tries to breathe and feels short of breath.  Denies any head, neck, back, chest or abdominal pain.  No fever.  Denies any possibility of pregnancy  The history is provided by the patient.  Fall Pertinent negatives include no chest pain, no abdominal pain, no headaches and no shortness of breath.      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   Periodic limb movements of sleep 08/07/2019   Episodic tension-type headache, not intractable 04/30/2019   ADD (attention deficit disorder) 03/26/2013   Generalized anxiety disorder 11/13/2012   Genital herpes 11/13/2012    Past Surgical History:  Procedure Laterality Date   CERVICAL CONIZATION W/BX N/A 09/21/2019   Procedure: COLD KNIFE CONIZATION CERVIX WITH BIOPSY;  Surgeon: Tilda Burrow, MD;  Location: [facility];  Service:  Gynecology;  Laterality: N/A;   NO PAST SURGERIES       OB History     Gravida  1   Para  1   Term  1   Preterm      AB      Living  1      SAB      IAB      Ectopic      Multiple      Live Births  1           Family History  Problem Relation Age of Onset   Depression Mother    Bipolar disorder Sister    Drug abuse Paternal Aunt    Cancer Paternal Grandmother    Hypertension Paternal Grandmother    Diabetes Paternal Grandmother    Arthritis Paternal Grandmother    Heart disease Paternal Grandmother     Social History   Tobacco Use   Smoking status: Every Day    Packs/day: 0.50    Years: 5.00    Pack years: 2.50    Types: Cigarettes   Smokeless tobacco: Never  Vaping Use   Vaping Use: Never used  Substance Use Topics   Alcohol use: No   Drug use: No    Home Medications Prior to Admission medications   Medication Sig Start Date  End Date Taking? Authorizing Provider  INTUNIV 1 MG TB24 ER tablet Take 1 mg by mouth at bedtime. 06/21/20   [provider]  Norethindrone-Ethinyl Estradiol-Fe Biphas (LO LOESTRIN FE) 1 MG-10 MCG / 10 MCG tablet Take 1 tablet by mouth daily. 05/10/20   Arabella Merles, CNM  ondansetron (ZOFRAN ODT) 8 MG disintegrating tablet Take 1.5 tablets (12 mg total) by mouth every 8 (eight) hours as needed for nausea or vomiting. Take TID PRN nausea 12/23/19   Babs Sciara, MD  SUMAtriptan (IMITREX) 50 MG tablet Take one tablet po at first sign of headache, may repeat 2 hrs later 12/23/19   Babs Sciara, MD  valACYclovir (VALTREX) 1000 MG tablet TAKE ONE TABLET ONCE DAILY 02/22/18   Babs Sciara, MD  venlafaxine (EFFEXOR) 100 MG tablet Take 100 mg by mouth 2 (two) times daily. 06/21/20   [provider]    Allergies    Abilify [aripiprazole], Celexa [citalopram hydrobromide], Clindamycin/lincomycin, Penicillins, Zoloft [sertraline hcl], and Amoxil [amoxicillin]  Review of Systems   Review of Systems   Constitutional:  Negative for activity change, appetite change and fever.  HENT:  Negative for congestion and rhinorrhea.   Respiratory:  Positive for chest tightness. Negative for shortness of breath.   Cardiovascular:  Negative for chest pain.  Gastrointestinal:  Negative for abdominal pain, nausea and vomiting.  Genitourinary:  Negative for dysuria and hematuria.  Musculoskeletal:  Positive for arthralgias and myalgias.  Skin:  Negative for rash.  Neurological:  Negative for dizziness, weakness and headaches.   all other systems are negative except as noted in the HPI and PMH.   Physical Exam Updated Vital Signs BP 122/66   Pulse 78   Temp 98.6 F (37 C) (Oral)   Resp 17   Ht 5\' 8"  (1.727 m)   Wt 86.2 kg   SpO2 99%   BMI 28.89 kg/m   Physical Exam Vitals and nursing note reviewed.  Constitutional:      General: She is not in acute distress.    Appearance: She is well-developed.  HENT:     Head: Normocephalic and atraumatic.     Mouth/Throat:     Pharynx: No oropharyngeal exudate.  Eyes:     Conjunctiva/sclera: Conjunctivae normal.     Pupils: Pupils are equal, round, and reactive to light.  Neck:     Comments: No C spine tenderness Cardiovascular:     Rate and Rhythm: Normal rate and regular rhythm.     Heart sounds: Normal heart sounds. No murmur heard. Pulmonary:     Effort: Pulmonary effort is normal. No respiratory distress.     Breath sounds: Normal breath sounds.     Comments: present.  There is tenderness to palpation of the left lateral ribs, no ecchymosis or crepitus. Chest:     Chest wall: Tenderness present.  Abdominal:     Palpations: Abdomen is soft.     Tenderness: There is no abdominal tenderness. There is no guarding or rebound.  Musculoskeletal:        General: No tenderness. Normal range of motion.     Cervical back: Normal range of motion and neck supple.  Skin:    General: Skin is warm.  Neurological:     Mental  Status: She is alert and oriented to person, place, and time.     Cranial Nerves: No cranial nerve deficit.     Motor: No abnormal muscle tone.     Coordination:  Coordination normal.     Comments:  5/5 strength throughout. CN 2-12 intact.Equal grip strength.   Psychiatric:        Behavior: Behavior normal.    ED Results / Procedures / Treatments   Labs (all labs ordered are listed, but only abnormal results are displayed) Labs Reviewed - No data to display  EKG None  Radiology DG Ribs Unilateral W/Chest Left  Result Date: 12/17/2020 CLINICAL DATA:  30 year old female status post fall with left rib pain. Smoker. EXAM: LEFT RIBS AND CHEST - 3+ VIEW COMPARISON:  Chest radiograph 12/29/2011. FINDINGS: Lower lung volumes. Normal cardiac size and mediastinal contours. Visualized tracheal air column is within normal limits. Mild crowding of lung markings, with mild increased likely smoking related interstitial markings since 2013. No pneumothorax or pleural effusion. Negative visible bowel gas. Bone mineralization is within normal limits. Two oblique views of the left ribs with marked anterior 7th rib where a minimally displaced fracture of the anterior rib and costochondral junction is identified (image 3). Questionable 8th rib costochondral fracture also. Other ribs and osseous structures appear intact. IMPRESSION: 1. Positive for left 7th and possibly 8th rib anterior/costochondral junction fractures. 2. Lower lung volumes. No pneumothorax or acute cardiopulmonary abnormality. Electronically Signed   By: Odessa Fleming M.D.   On: 12/17/2020 06:06    Procedures Procedures   Medications Ordered in ED Medications  ibuprofen (ADVIL) tablet 400 mg (400 mg Oral Given 12/17/20 0456)    ED Course  I have reviewed the triage vital signs and the nursing notes.  Pertinent labs & imaging results that were available during my care of the patient were reviewed by me and considered in my medical decision  making (see chart for details).    MDM Rules/Calculators/A&P                         Rib pain after fall.  No head injury.  Equal breath sounds.  X-ray shows fractures of seventh and possibly eighth rib. No pneumothorax.  No increased work of breathing or hypoxia. No abdominal pain.  Low suspicion for intrathoracic or intra-abdominal injury.  Narcotic database reviewed.  Will give course of pain medication, incentive spirometer, encourage deep breathing. Follow-up with PCP.  Take narcotic pain medication if working or driving.  Return precautions discussed  Final Clinical Impression(s) / ED Diagnoses Final diagnoses:  Closed fracture of multiple ribs of left side, initial encounter    Rx / DC Orders ED Discharge Orders     None        Necole Minassian, Jeannett Senior, MD 12/17/20 [phone]

## 2020-12-18 ENCOUNTER — Telehealth: Payer: Self-pay | Admitting: *Deleted

## 2020-12-18 NOTE — Telephone Encounter (Signed)
Transition Care Management Follow-up Telephone Call Date of discharge and from where: 12/17/2020 - [facility] How have you been since you were released from the hospital? "Still in a lot of pain" Any questions or concerns? No  Items Reviewed: Did the pt receive and understand the discharge instructions provided? Yes  Medications obtained and verified? Yes  Other? No  Any new allergies since your discharge? No  Dietary orders reviewed? No Do you have support at home? Yes    Functional Questionnaire: (I = Independent and D = Dependent) ADLs: I  Bathing/Dressing- I  Meal Prep- I  Eating- I  Maintaining continence- I  Transferring/Ambulation- I  Managing Meds- I  Follow up appointments reviewed:  PCP Hospital f/u appt confirmed? Yes  Scheduled to see Dr. Gerda Diss on 12/26/2020 @ 1100. Specialist Hospital f/u appt confirmed? No   Are transportation arrangements needed? No  If their condition worsens, is the pt aware to call PCP or go to the Emergency Dept.? Yes Was the patient provided with contact information for the PCP's office or ED? Yes Was to pt encouraged to call back with questions or concerns? Yes

## 2020-12-19 ENCOUNTER — Encounter: Payer: Self-pay | Admitting: Family Medicine

## 2020-12-19 ENCOUNTER — Other Ambulatory Visit: Payer: Self-pay

## 2020-12-19 ENCOUNTER — Ambulatory Visit (INDEPENDENT_AMBULATORY_CARE_PROVIDER_SITE_OTHER): Payer: Medicaid Other | Admitting: Family Medicine

## 2020-12-19 VITALS — BP 108/69 | HR 77 | Temp 98.9°F | Ht 68.0 in | Wt 203.0 lb

## 2020-12-19 DIAGNOSIS — S2242XD Multiple fractures of ribs, left side, subsequent encounter for fracture with routine healing: Secondary | ICD-10-CM | POA: Diagnosis not present

## 2020-12-19 NOTE — Progress Notes (Signed)
   Subjective:    Patient ID: Tracy Flores, female    DOB: 06-Dec-1990, 30 y.o.   MRN: [id number]  HPIED follow up from 12/17/20 for rib fracture.  Patient fell in the middle of the night getting up injured her left ribs for some lip pain with motion had x-rays showed a fracture   Review of Systems     Objective:   Physical Exam  Lungs clear respiratory rate normal heart regular otherwise tenderness in the left rib region x-rays reviewed      Assessment & Plan:  Rib fracture No lifting over 5 pounds next 2 weeks after that no lifting over 10 pounds for additional 2 weeks Could take 6 to 8 weeks to heal up Using Tylenol ibuprofen rarely using rarely using the hydrocodone cautioned against this use of pain meds hopefully in the next week she will be off of pain meds

## 2020-12-26 ENCOUNTER — Inpatient Hospital Stay: Payer: Medicaid Other | Admitting: Family Medicine

## 2021-02-08 ENCOUNTER — Other Ambulatory Visit (HOSPITAL_COMMUNITY)
Admission: RE | Admit: 2021-02-08 | Discharge: 2021-02-08 | Disposition: A | Discharge status: Home / Self Care | Payer: Medicaid Other | Source: Ambulatory Visit | Attending: Adult Health | Admitting: Adult Health

## 2021-02-08 ENCOUNTER — Encounter: Payer: Self-pay | Admitting: Adult Health

## 2021-02-08 ENCOUNTER — Other Ambulatory Visit: Payer: Self-pay

## 2021-02-08 ENCOUNTER — Ambulatory Visit: Payer: Medicaid Other | Admitting: Adult Health

## 2021-02-08 VITALS — BP 105/68 | HR 80 | Ht 67.0 in | Wt 204.0 lb

## 2021-02-08 DIAGNOSIS — Z3202 Encounter for pregnancy test, result negative: Secondary | ICD-10-CM | POA: Insufficient documentation

## 2021-02-08 DIAGNOSIS — N920 Excessive and frequent menstruation with regular cycle: Secondary | ICD-10-CM

## 2021-02-08 DIAGNOSIS — Z124 Encounter for screening for malignant neoplasm of cervix: Secondary | ICD-10-CM

## 2021-02-08 DIAGNOSIS — D069 Carcinoma in situ of cervix, unspecified: Secondary | ICD-10-CM

## 2021-02-08 LAB — POCT URINE PREGNANCY: Preg Test, Ur: NEGATIVE

## 2021-02-08 MED ORDER — TRANEXAMIC ACID 650 MG PO TABS: 1300.0000 mg | ORAL_TABLET | Freq: Three times a day (TID) | ORAL | 1 refills | Status: DC

## 2021-02-08 NOTE — Progress Notes (Signed)
  Subjective:     Patient ID: Tracy Flores, female   DOB: 1990/12/16, 30 y.o.   MRN: [id number]  HPI Tracy Flores is a 30 year old white female,single, G1P1 in complaining of heavy peridos for last 3 months. She is not currently having sex. She has history of CIN II-III, and had CKC. PCP is Dr Lilyan Punt.  Review of Systems Periods last 3-4 days and heavy the whole time, changes pads every 1-2 hours, +cramps  No sex lately Reviewed past medical,surgical, social and family history. Reviewed medications and allergies.     Objective:   Physical Exam BP 105/68 (BP Location: Left Arm, Patient Position: Sitting, Cuff Size: Large)   Pulse 80   Ht 5\' 7"  (1.702 m)   Wt 204 lb (92.5 kg)   LMP 01/24/2021 (Approximate)   BMI 31.95 kg/m  UPT is negative. Skin warm and dry.Pelvic: external genitalia is normal in appearance no lesions, vagina: white discharge without odor,urethra has no lesions or masses noted, cervix:smooth and bulbous,pap with GC/CHL and HR HPV genotyping per formed. uterus: normal size, shape and contour, non tender, no masses felt, adnexa: no masses or tenderness noted. Bladder is non tender and no masses felt.    Fall risk is low.  Upstream - 02/08/21 1142       Pregnancy Intention Screening   Does the patient want to become pregnant in the next year? No    Does the patient's partner want to become pregnant in the next year? No    Would the patient like to discuss contraceptive options today? No      Contraception Wrap Up   Current Method Female Condom    End Method Female Condom    Contraception Counseling Provided No            Examination chaperoned by 04/10/21 LPN  Assessment:     1. Pregnancy examination or test, negative result   2. Menorrhagia with regular cycle Check CBC,CMP and TSH Will rx lysteda, she declines OCs Meds ordered this encounter  Medications   tranexamic acid (LYSTEDA) 650 MG TABS tablet    Sig: Take 2 tablets (1,300 mg total) by mouth 3  (three) times daily.    Dispense:  30 tablet    Refill:  1    Order Specific Question:   Supervising Provider    Answer:   Malachy Mood, LUTHER H [[id number]]     3. Routine cervical smear Pap sent  4. 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

## 2021-02-09 LAB — COMPREHENSIVE METABOLIC PANEL
ALT: 13 IU/L (ref 0–32)
AST: 17 IU/L (ref 0–40)
Albumin/Globulin Ratio: 1.9 (ref 1.2–2.2)
Albumin: 4.5 g/dL (ref 3.8–4.8)
Alkaline Phosphatase: 112 IU/L (ref 44–121)
BUN/Creatinine Ratio: 15 (ref 9–23)
BUN: 11 mg/dL (ref 6–20)
Bilirubin Total: <0.2 mg/dL (ref 0.0–1.2)
CO2: 23 mmol/L (ref 20–29)
Calcium: 9.7 mg/dL (ref 8.7–10.2)
Chloride: 101 mmol/L (ref 96–106)
Creatinine, Ser: 0.72 mg/dL (ref 0.57–1.00)
Globulin, Total: 2.4 g/dL (ref 1.5–4.5)
Glucose: 76 mg/dL (ref 65–99)
Potassium: 4.6 mmol/L (ref 3.5–5.2)
Sodium: 139 mmol/L (ref 134–144)
Total Protein: 6.9 g/dL (ref 6.0–8.5)
eGFR: 115 mL/min/{1.73_m2} (ref >59)

## 2021-02-09 LAB — CBC
Hematocrit: 45 % (ref 34.0–46.6)
Hemoglobin: 14.9 g/dL (ref 11.1–15.9)
MCH: 28.9 pg (ref 26.6–33.0)
MCHC: 33.1 g/dL (ref 31.5–35.7)
MCV: 87 fL (ref 79–97)
Platelets: 294 10*3/uL (ref 150–450)
RBC: 5.16 x10E6/uL (ref 3.77–5.28)
RDW: 12.9 % (ref 11.7–15.4)
WBC: 8.4 10*3/uL (ref 3.4–10.8)

## 2021-02-09 LAB — TSH: TSH: 3.34 u[IU]/mL (ref 0.450–4.500)

## 2021-02-14 LAB — CYTOLOGY - PAP
Chlamydia: NEGATIVE
Comment: NEGATIVE
Comment: NEGATIVE
Comment: NORMAL
Diagnosis: NEGATIVE
High risk HPV: NEGATIVE
Neisseria Gonorrhea: NEGATIVE

## 2021-04-05 ENCOUNTER — Other Ambulatory Visit: Payer: Self-pay

## 2021-04-05 ENCOUNTER — Ambulatory Visit: Payer: Medicaid Other | Admitting: Adult Health

## 2021-04-27 ENCOUNTER — Other Ambulatory Visit: Payer: Self-pay

## 2021-04-27 ENCOUNTER — Ambulatory Visit (INDEPENDENT_AMBULATORY_CARE_PROVIDER_SITE_OTHER): Payer: Medicaid Other | Admitting: Nurse Practitioner

## 2021-04-27 ENCOUNTER — Encounter: Payer: Self-pay | Admitting: Nurse Practitioner

## 2021-04-27 VITALS — BP 113/79 | HR 83 | Temp 98.2°F | Ht 67.0 in | Wt 209.0 lb

## 2021-04-27 DIAGNOSIS — R635 Abnormal weight gain: Secondary | ICD-10-CM | POA: Diagnosis not present

## 2021-04-27 DIAGNOSIS — L02416 Cutaneous abscess of left lower limb: Secondary | ICD-10-CM

## 2021-04-27 MED ORDER — DOXYCYCLINE HYCLATE 100 MG PO TABS
100.0000 mg | ORAL_TABLET | Freq: Two times a day (BID) | ORAL | 0 refills | Status: DC
Start: 2021-04-27 — End: 2021-11-01

## 2021-04-27 NOTE — Progress Notes (Signed)
   Subjective:    Patient ID: Tracy Flores, female    DOB: 12-21-1990, 30 y.o.   MRN: [id number]  HPI Weight concerns  Place on inner thigh L    Review of Systems     Objective:   Physical Exam        Assessment & Plan:

## 2021-04-27 NOTE — Progress Notes (Signed)
Subjective:    Patient ID: Tracy Flores, female    DOB: 1991/04/30, 30 y.o.   MRN: [id number]  HPI Patient presents for concern over 35 lb weight gain in the past 6 months. She also has concerns as she developed a red spot on her inner thigh after getting out of the shower this AM. Tender to the touch. No other rash. Her work schedule is very heavy and she works weekends as well. Does not have time to exercise but she is on her feet at job. Does not make the best food choices.  She eats fast food on the go and drinks 2-3 regular sodas daily. Denies feeling depressed but does feel overwhelmed with some anxiety. Sleep is not affected. She does smoke tobacco. Has increase her smoking from 1/2 ppd to about 1 ppd which she relates to her stress. She is also not taking the Lysteda . Labs done through GYN.     Review of Systems  Constitutional:  Negative for chills, fatigue and fever.  Respiratory:  Negative for cough, shortness of breath and wheezing.   Cardiovascular:  Negative for chest pain.  Gastrointestinal:  Negative for abdominal pain, constipation, diarrhea, nausea and vomiting.  Skin:  Positive for rash.  Neurological:  Positive for dizziness.      Objective:   Physical Exam Constitutional:      Appearance: Normal appearance.  Cardiovascular:     Rate and Rhythm: Normal rate and regular rhythm.  Pulmonary:     Effort: Pulmonary effort is normal.     Breath sounds: Normal breath sounds. No wheezing.  Skin:    Comments: Small raised slightly fluctuant lesion on the left thigh. Mild surrounding erythema. Slightly tender to palpation.   Neurological:     Mental Status: She is alert and oriented to person, place, and time.  Psychiatric:        Mood and Affect: Mood normal.        Behavior: Behavior normal.        Thought Content: Thought content normal.        Judgment: Judgment normal.    .. Vitals:   04/27/21 1413  BP: 113/79  Pulse: 83  Temp: 98.2 F (36.8 C)   Height: 5' 7" (1.702 m)  Weight: 209 lb (94.8 kg)  SpO2: 98%  BMI (Calculated): 32.73    Results for orders placed or performed in visit on 02/08/21  CBC  Result Value Ref Range   WBC 8.4 3.4 - 10.8 x10E3/uL   RBC 5.16 3.77 - 5.28 x10E6/uL   Hemoglobin 14.9 11.1 - 15.9 g/dL   Hematocrit 45.0 34.0 - 46.6 %   MCV 87 79 - 97 fL   MCH 28.9 26.6 - 33.0 pg   MCHC 33.1 31.5 - 35.7 g/dL   RDW 12.9 11.7 - 15.4 %   Platelets 294 150 - 450 x10E3/uL  Comprehensive metabolic panel  Result Value Ref Range   Glucose 76 65 - 99 mg/dL   BUN 11 6 - 20 mg/dL   Creatinine, Ser 0.72 0.57 - 1.00 mg/dL   eGFR 115 >59 mL/min/1.73   BUN/Creatinine Ratio 15 9 - 23   Sodium 139 134 - 144 mmol/L   Potassium 4.6 3.5 - 5.2 mmol/L   Chloride 101 96 - 106 mmol/L   CO2 23 20 - 29 mmol/L   Calcium 9.7 8.7 - 10.2 mg/dL   Total Protein 6.9 6.0 - 8.5 g/dL   Albumin 4.5 3.8 - 4.8 g/dL  Globulin, Total 2.4 1.5 - 4.5 g/dL   Albumin/Globulin Ratio 1.9 1.2 - 2.2   Bilirubin Total <0.2 0.0 - 1.2 mg/dL   Alkaline Phosphatase 112 44 - 121 IU/L   AST 17 0 - 40 IU/L   ALT 13 0 - 32 IU/L  TSH  Result Value Ref Range   TSH 3.340 0.450 - 4.500 uIU/mL  POCT urine pregnancy  Result Value Ref Range   Preg Test, Ur Negative Negative          Assessment & Plan:  Abscess of left thigh  Weight gain  Plan: Discussed time management and finding time to exercise and take care of herself.  Try to wean off of Sodas due to sugar and replace with sugar free beverages. Discussed ways to eat healthier by preparing healthy snacks and meals for work.   Meds ordered this encounter  Medications   doxycycline (VIBRA-TABS) 100 MG tablet    Sig: Take 1 tablet (100 mg total) by mouth 2 (two) times daily.    Dispense:  14 tablet    Refill:  0    Order Specific Question:   Supervising Provider    Answer:   Sallee Lange A [id number]   Start antibiotics for small abscess. Warm compresses. Warning signs reviewed.  Call back  next week if no improvement, sooner if worse.

## 2021-04-28 ENCOUNTER — Encounter: Payer: Self-pay | Admitting: Nurse Practitioner

## 2021-11-01 ENCOUNTER — Ambulatory Visit: Payer: Medicaid Other | Admitting: Family Medicine

## 2021-11-01 VITALS — BP 108/67 | HR 69 | Temp 97.9°F | Ht 67.0 in | Wt 196.0 lb

## 2021-11-01 DIAGNOSIS — F988 Other specified behavioral and emotional disorders with onset usually occurring in childhood and adolescence: Secondary | ICD-10-CM

## 2021-11-01 MED ORDER — LISDEXAMFETAMINE DIMESYLATE 20 MG PO CAPS: 20.0000 mg | ORAL_CAPSULE | Freq: Every day | ORAL | 0 refills | Status: DC

## 2021-11-01 NOTE — Progress Notes (Signed)
? ?  Subjective:  ? ? Patient ID: Tracy Flores, female    DOB: 10/30/1990, 31 y.o.   MRN: [id number] ? ?HPI ? ?Patient is here due to having difficulty with focus at work x 1.5 month- she is a Child psychotherapist ?Has ADD symptoms.  This been going on ever since elementary and junior high ?She had a hard time focusing ?Daydreaming frequently ?She was treated with ADD medicines back in 2020 20 and 21 but then stopped going to the psychiatrist she was going to ? ?Patient denies any drug abuse. ? ?She states that her job as a Child psychotherapist she often has to handle multiple issues all at one time and finds her self having a hard time staying focused and attentive.  As a result if she gets distracted by a job duty causes her to forget another aspect of her job ?Review of Systems ? ?   ?Objective:  ? Physical Exam ? ?Lungs clear heart regular ?No depression ?Patient does have some level of anxiousness  ? ?   ?Assessment & Plan:  ?ADD ?Had this in junior high and high school ?Was treated for period of time and found it beneficial ?She is stopped her treatment ?We will reinitiate therapy ?We did discuss with patient safety measures regarding this ?We will also have patient follow-up with [place] within the next 2 months to see how the effectiveness is doing ?She was cautioned regarding medication. ? ?Vyvanse 20 mg ?In approximately 3 to 4 weeks to see how this dose is doing if it is doing well and then we will stick with it if it needs to be adjusted upward we can ?Previously with Adderall 30 mg she had increased anxiety.  We will monitor everything closely. ? ?On her follow-up we will discuss anxiety more on whether or not she is interested in trying a low-dose serotonin reuptake inhibitor ?

## 2021-11-27 ENCOUNTER — Ambulatory Visit: Payer: Medicaid Other | Admitting: Family Medicine

## 2021-11-27 VITALS — BP 103/71 | HR 72 | Temp 97.9°F | Ht 67.0 in | Wt 186.4 lb

## 2021-11-27 DIAGNOSIS — F988 Other specified behavioral and emotional disorders with onset usually occurring in childhood and adolescence: Secondary | ICD-10-CM | POA: Diagnosis not present

## 2021-11-27 MED ORDER — LISDEXAMFETAMINE DIMESYLATE 30 MG PO CAPS: 30.0000 mg | ORAL_CAPSULE | Freq: Every day | ORAL | 0 refills | Status: DC

## 2021-11-27 NOTE — Patient Instructions (Addendum)
Hi Tracy Flores  It was good to see you I adjusted the medicine at 30 mg You may pick this up tomorrow Please send [place] a message in a few weeks time letting us know about this is working out Follow-up in 3 months TakeCare-Dr. Lorin Picket

## 2021-11-27 NOTE — Progress Notes (Signed)
   Subjective:    Patient ID: Tracy Flores, female    DOB: [birth year], 31 y.o.   MRN: [id number]  HPI  This patient has adult ADD. Takes medication responsibly. Medication does help the patient focus in be more functional. Patient relates that they are or not abusing the medication or misusing the medication. The patient understands that if they're having any negative side effects such as elevated high blood pressure severe headaches they would need stop the medication follow-up immediately. They also understand that the prescriptions are to last for 3 months then the patient will need to follow-up before having further prescriptions.  Patient compliance Yes  Does medication help patient function /attention better  Helps some  Side effects  No   Review of Systems     Objective:   Physical Exam  General-in no acute distress Eyes-no discharge Lungs-respiratory rate normal, CTA CV-no murmurs,RRR Extremities skin warm dry no edema Neuro grossly normal Behavior normal, alert       Assessment & Plan:  The patient was seen today as part of the visit regarding ADD.  Patient is stable on current regimen.  Appropriate prescriptions prescribed.  Medications were reviewed with the patient as well as compliance. Side effects were checked for. Discussion regarding effectiveness was held. Prescriptions were electronically sent in.  Patient reminded to follow-up in approximately 3 months.   Plans to [facility] law with drug registry was checked and verified while present with the patient.   New dose 30 mg Pt to give feedback in 3 weeks She should do better on this newer dose but she will give [place] feedback soon  Follow-up 3 months

## 2021-12-27 ENCOUNTER — Encounter: Payer: Self-pay | Admitting: Family Medicine

## 2022-03-05 ENCOUNTER — Other Ambulatory Visit: Payer: Self-pay | Admitting: Family Medicine

## 2022-03-12 ENCOUNTER — Ambulatory Visit: Payer: Medicaid Other | Admitting: Family Medicine

## 2022-03-18 ENCOUNTER — Ambulatory Visit
Admission: RE | Admit: 2022-03-18 | Discharge: 2022-03-18 | Disposition: A | Discharge status: Home / Self Care | Payer: Medicaid Other | Source: Ambulatory Visit | Attending: Family Medicine | Admitting: Family Medicine

## 2022-03-18 VITALS — BP 119/78 | HR 92 | Temp 99.2°F | Resp 18

## 2022-03-18 DIAGNOSIS — W57XXXA Bitten or stung by nonvenomous insect and other nonvenomous arthropods, initial encounter: Secondary | ICD-10-CM

## 2022-03-18 DIAGNOSIS — L299 Pruritus, unspecified: Secondary | ICD-10-CM

## 2022-03-18 MED ORDER — TRIAMCINOLONE ACETONIDE 0.1 % EX CREA: 1.0000 | TOPICAL_CREAM | Freq: Two times a day (BID) | CUTANEOUS | 0 refills | Status: DC

## 2022-03-18 MED ORDER — METHYLPREDNISOLONE SODIUM SUCC 125 MG IJ SOLR
80.0000 mg | Freq: Once | INTRAMUSCULAR | Status: AC
  Administered 2022-03-18: 80 mg via INTRAMUSCULAR

## 2022-03-18 NOTE — ED Provider Notes (Signed)
[facility]    CSN: [id number] Arrival date & time: 03/18/22  1248      History   Chief Complaint Chief Complaint  Patient presents with   Insect Bite    Entered by patient    HPI Tracy Flores is a 31 y.o. female.   Patient presenting today with numerous mosquito bites across her body from a cookout on Saturday night.  States the itching is intense and the areas are becoming red and swollen where she was bitten.  She has tried calamine lotion with no relief.  Denies throat itching or swelling, chest tightness, difficulty breathing, nausea, vomiting.     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   Routine cervical smear 02/08/2021   Menorrhagia with regular cycle 02/08/2021   Pregnancy examination or test, negative result 02/08/2021   Anxiety and depression 01/21/2020   Migraine, chronic, without aura 12/23/2019   Moderate dysplasia of cervix (CIN II) 09/30/2019   Periodic limb movements of sleep 08/07/2019   Episodic tension-type headache, not intractable 04/30/2019   ADD (attention deficit disorder) 03/26/2013   Generalized anxiety disorder 11/13/2012   Genital herpes 11/13/2012    Past Surgical History:  Procedure Laterality Date   CERVICAL CONIZATION W/BX N/A 09/21/2019   Procedure: COLD KNIFE CONIZATION CERVIX WITH BIOPSY;  Surgeon: Tilda Burrow, MD;  Location: [facility];  Service: Gynecology;  Laterality: N/A;   NO PAST SURGERIES      OB History     Gravida  1    Para  1   Term  1   Preterm      AB      Living  1      SAB      IAB      Ectopic      Multiple      Live Births  1            Home Medications    Prior to Admission medications   Medication Sig Start Date End Date Taking? Authorizing Provider  lisdexamfetamine (VYVANSE) 30 MG capsule Take 1 capsule (30 mg total) by mouth daily. 11/27/21  Yes Babs Sciara, MD  triamcinolone cream (KENALOG) 0.1 % Apply 1 Application topically 2 (two) times daily. Avoid use on the face 03/18/22  Yes Particia Nearing, PA-C  VYVANSE 30 MG capsule TAKE ONE CAPSULE (30MG  TOTAL) BY MOUTH DAILY 03/06/22   05/06/22, MD  lisdexamfetamine (VYVANSE) 30 MG capsule Take 1 capsule (30 mg total) by mouth daily. 11/27/21   11/29/21, MD  tranexamic acid (LYSTEDA) 650 MG TABS tablet Take 2 tablets (1,300 mg total) by mouth 3 (three) times daily. Patient not taking: Reported on 11/01/2021 02/08/21   04/10/21, NP    Family History Family History  Problem Relation Age of Onset   Cancer Paternal Grandmother  Hypertension Paternal Grandmother    Diabetes Paternal Grandmother    Arthritis Paternal Grandmother    Heart disease Paternal Grandmother    Depression Mother    Bipolar disorder Sister    Drug abuse Paternal Aunt     Social History Social History   Tobacco Use   Smoking status: Every Day    Packs/day: 0.50    Years: 5.00    Total pack years: 2.50    Types: Cigarettes   Smokeless tobacco: Never  Vaping Use   Vaping Use: Never used  Substance Use Topics   Alcohol use: No   Drug use: No     Allergies   Abilify [aripiprazole], Celexa [citalopram hydrobromide], Clavulanic acid, Clindamycin/lincomycin, Penicillins, Zoloft [sertraline hcl], and Amoxil [amoxicillin]   Review of Systems Review of Systems Per HPI  Physical Exam Triage Vital Signs ED Triage Vitals  Enc Vitals Group     BP 03/18/22 1302 119/78     Pulse Rate 03/18/22 1302 92     Resp  03/18/22 1302 18     Temp 03/18/22 1302 99.2 F (37.3 C)     Temp Source 03/18/22 1302 Oral     SpO2 03/18/22 1302 98 %     Weight --      Height --      Head Circumference --      Peak Flow --      Pain Score 03/18/22 1301 0     Pain Loc --      Pain Edu? --      Excl. in GC? --    No data found.  Updated Vital Signs BP 119/78 (BP Location: Right Arm)   Pulse 92   Temp 99.2 F (37.3 C) (Oral)   Resp 18   LMP 02/11/2022 (Approximate)   SpO2 98%   Visual Acuity Right Eye Distance:   Left Eye Distance:   Bilateral Distance:    Right Eye Near:   Left Eye Near:    Bilateral Near:     Physical Exam Vitals and nursing note reviewed.  Constitutional:      Appearance: Normal appearance. She is not ill-appearing.  HENT:     Head: Atraumatic.  Eyes:     Extraocular Movements: Extraocular movements intact.     Conjunctiva/sclera: Conjunctivae normal.  Cardiovascular:     Rate and Rhythm: Normal rate and regular rhythm.     Heart sounds: Normal heart sounds.  Pulmonary:     Effort: Pulmonary effort is normal.     Breath sounds: Normal breath sounds.  Musculoskeletal:        General: Normal range of motion.     Cervical back: Normal range of motion and neck supple.  Skin:    General: Skin is warm.     Comments: Erythematous maculopapular bites sporadic across extremities, face with surrounding erythema  Neurological:     Mental Status: She is alert and oriented to person, place, and time.  Psychiatric:        Mood and Affect: Mood normal.        Thought Content: Thought content normal.        Judgment: Judgment normal.      UC Treatments / Results  Labs (all labs ordered are listed, but only abnormal results are displayed) Labs Reviewed - No data to display  EKG   Radiology No results found.  Procedures Procedures (including critical care time)  Medications Ordered in UC Medications  methylPREDNISolone sodium succinate (SOLU-MEDROL) 125 mg/2  mL  injection 80 mg (80 mg Intramuscular Given 03/18/22 1315)    Initial Impression / Assessment and Plan / UC Course  I have reviewed the triage vital signs and the nursing notes.  Pertinent labs & imaging results that were available during my care of the patient were reviewed by me and considered in my medical decision making (see chart for details).     Given widespread nature and significant itching, treat with IM Solu-Medrol, antihistamines twice daily, triamcinolone cream.  Return for worsening symptoms.  Final Clinical Impressions(s) / UC Diagnoses   Final diagnoses:  Insect bite, unspecified site, initial encounter  Itching     Discharge Instructions      May take antihistamine such as Zyrtec twice a day to help with the itching.  I have sent in a steroid cream called triamcinolone for use anywhere but the face topically, you may use over-the-counter hydrocortisone cream for the face if needed.  The steroid injection that we have given today should help clear the itching and bites over the next few days.    ED Prescriptions     Medication Sig Dispense Auth. Provider   triamcinolone cream (KENALOG) 0.1 % Apply 1 Application topically 2 (two) times daily. Avoid use on the face 60 g Volney American, [place]      PDMP not reviewed this encounter.   Volney American, [place] 03/18/22 1320

## 2022-03-18 NOTE — ED Triage Notes (Signed)
Pt states she has insect bites all over her body and they itch since Saturday. She put some OTC anti itch spray last night.

## 2022-03-18 NOTE — Discharge Instructions (Signed)
May take antihistamine such as Zyrtec twice a day to help with the itching.  I have sent in a steroid cream called triamcinolone for use anywhere but the face topically, you may use over-the-counter hydrocortisone cream for the face if needed.  The steroid injection that we have given today should help clear the itching and bites over the next few days.

## 2022-03-22 ENCOUNTER — Ambulatory Visit: Payer: Medicaid Other | Admitting: Nurse Practitioner

## 2022-03-22 ENCOUNTER — Encounter: Payer: Self-pay | Admitting: Nurse Practitioner

## 2022-03-22 VITALS — BP 118/64 | HR 72 | Temp 98.1°F | Ht 67.0 in | Wt 188.0 lb

## 2022-03-22 DIAGNOSIS — F988 Other specified behavioral and emotional disorders with onset usually occurring in childhood and adolescence: Secondary | ICD-10-CM | POA: Diagnosis not present

## 2022-03-22 DIAGNOSIS — Z23 Encounter for immunization: Secondary | ICD-10-CM

## 2022-03-22 MED ORDER — LISDEXAMFETAMINE DIMESYLATE 40 MG PO CAPS: 40.0000 mg | ORAL_CAPSULE | ORAL | 0 refills | Status: DC

## 2022-03-22 NOTE — Progress Notes (Unsigned)
   Subjective:    Patient ID: Tracy Flores, female    DOB: 1991/03/01, 31 y.o.   MRN: [id number]  HPI  This patient has adult ADD. Takes medication responsibly. Medication does help the patient focus in be more functional. Patient relates that they are or not abusing the medication or misusing the medication. The patient understands that if they're having any negative side effects such as elevated high blood pressure severe headaches they would need stop the medication follow-up immediately. They also understand that the prescriptions are to last for 3 months then the patient will need to follow-up before having further prescriptions.  Patient compliance daily  Does medication help patient function /attention better yes, but not working as well.  Has been under more stress lately due to her grandfather's terminal illness.  Continues to work. Denies any current sexual partners.  Defers need for contraceptives.  Regular cycles normal flow lasting for about 5 days. Side effects  none  Review of Systems  Respiratory:  Negative for chest tightness and shortness of breath.   Cardiovascular:  Negative for chest pain and palpitations.  Psychiatric/Behavioral:  Positive for decreased concentration. Negative for sleep disturbance.        Objective:   Physical Exam NAD.  Alert, oriented.  Calm cheerful affect.  Making good eye contact.  Speech clear.  Dressed appropriately for the weather.  Thoughts logical coherent and relevant.  Lungs clear.  Heart regular rate rhythm. Today's Vitals   03/22/22 1013  BP: 118/64  Pulse: 72  Temp: 98.1 F (36.7 C)  SpO2: 100%  Weight: 188 lb (85.3 kg)  Height: 5\' 7"  (1.702 m)   Body mass index is 29.44 kg/m.        Assessment & Plan:   Problem List Items Addressed This Visit   None Visit Diagnoses     Immunization due    -  Primary   Relevant Orders   Flu Vaccine QUAD 76mo+IM (Fluarix, Fluzone & Alfiuria Quad PF)      Meds ordered this  encounter  Medications   lisdexamfetamine (VYVANSE) 40 MG capsule    Sig: Take 1 capsule (40 mg total) by mouth every morning.    Dispense:  30 capsule    Refill:  0    Order Specific Question:   Supervising Provider    Answer:   Sallee Lange A [[id number]]   Increase Vyvanse dose to 40 mg daily.  Again caution about potential adverse effects.  Discontinue medication and contact office if any problems.  Discussed the importance of stress reduction.  Patient also advised to use protection if sexually active and not to take Vyvanse if she becomes pregnant or plans pregnancy. Return in about 3 months (around 06/21/2022). Contact office in a few weeks to let us know about the new dose.

## 2022-04-16 ENCOUNTER — Encounter: Payer: Self-pay | Admitting: Nurse Practitioner

## 2022-04-19 ENCOUNTER — Other Ambulatory Visit: Payer: Self-pay | Admitting: Nurse Practitioner

## 2022-04-26 ENCOUNTER — Other Ambulatory Visit: Payer: Self-pay | Admitting: Nurse Practitioner

## 2022-04-26 MED ORDER — LISDEXAMFETAMINE DIMESYLATE 40 MG PO CAPS: 40.0000 mg | ORAL_CAPSULE | ORAL | 0 refills | Status: DC

## 2022-05-27 ENCOUNTER — Telehealth: Payer: Self-pay | Admitting: Nurse Practitioner

## 2022-05-28 ENCOUNTER — Other Ambulatory Visit: Payer: Self-pay | Admitting: Family Medicine

## 2022-05-28 NOTE — Telephone Encounter (Signed)
I did send in her refill Please schedule her for a follow-up office visit no later than the start of January for ADD

## 2022-06-21 ENCOUNTER — Ambulatory Visit: Payer: Medicaid Other | Admitting: Family Medicine

## 2022-07-10 ENCOUNTER — Telehealth: Payer: Self-pay

## 2022-07-10 ENCOUNTER — Other Ambulatory Visit: Payer: Self-pay | Admitting: Family Medicine

## 2022-07-10 NOTE — Telephone Encounter (Signed)
Encourage patient to contact the pharmacy for refills or they can request refills through [facility]  (Please schedule appointment if patient has not been seen in over a year)    [place] TO: [place], [place] (VYVANSE) 40 MG capsule   NOTES/COMMENTS FROM PATIENT:Pt has appt in two weeks to see Dr Nicki Reaper  Pt is out of medication      Front office please notify patient: It takes 48-72 hours to process rx refill requests Ask patient to call pharmacy to ensure rx is ready before heading there.

## 2022-07-11 NOTE — Telephone Encounter (Signed)
Temporary supply was sent in keep follow-up visit FYI

## 2022-07-12 NOTE — Telephone Encounter (Signed)
Patient notified

## 2022-07-23 ENCOUNTER — Ambulatory Visit: Payer: Medicaid Other | Admitting: Family Medicine

## 2022-07-23 ENCOUNTER — Encounter: Payer: Self-pay | Admitting: Family Medicine

## 2022-07-23 VITALS — HR 87 | Temp 97.7°F | Ht 67.0 in | Wt 190.0 lb

## 2022-07-23 DIAGNOSIS — F988 Other specified behavioral and emotional disorders with onset usually occurring in childhood and adolescence: Secondary | ICD-10-CM

## 2022-07-23 MED ORDER — LISDEXAMFETAMINE DIMESYLATE 40 MG PO CAPS: 40.0000 mg | ORAL_CAPSULE | ORAL | 0 refills | Status: DC

## 2022-07-23 MED ORDER — LISDEXAMFETAMINE DIMESYLATE 40 MG PO CAPS: ORAL_CAPSULE | ORAL | 0 refills | Status: DC

## 2022-07-23 NOTE — Progress Notes (Signed)
   Subjective:    Patient ID: Tracy Flores, female    DOB: 30-Jun-1991, 32 y.o.   MRN: [id number]  HPI This patient has adult ADD. Takes medication responsibly. Medication does help the patient focus in be more functional. Patient relates that they are or not abusing the medication or misusing the medication. The patient understands that if they're having any negative side effects such as elevated high blood pressure severe headaches they would need stop the medication follow-up immediately. They also understand that the prescriptions are to last for 3 months then the patient will need to follow-up before having further prescriptions.  Patient compliance relates compliance  Does medication help patient function /attention better states medicine does help her stay focused  Side effects she denies side effects  Patient denies being depressed currently  Review of Systems     Objective:   Physical Exam  General-in no acute distress Eyes-no discharge Lungs-respiratory rate normal, CTA CV-no murmurs,RRR Extremities skin warm dry no edema Neuro grossly normal Behavior normal, alert       Assessment & Plan:  ADD The patient was seen today as part of the visit regarding ADD.  Patient is stable on current regimen.  Appropriate prescriptions prescribed.  Medications were reviewed with the patient as well as compliance. Side effects were checked for. Discussion regarding effectiveness was held. Prescriptions were electronically sent in.  Patient reminded to follow-up in approximately 3 months.   Plans to [facility] law with drug registry was checked and verified while present with the patient. Drug registry checked 3 prescription sent in Follow-up in approximately 3 to 4 months

## 2022-07-23 NOTE — Telephone Encounter (Signed)
Patient had appointment today for medication followup

## 2022-07-23 NOTE — Patient Instructions (Signed)
Steps to Quit Smoking Smoking tobacco is the leading cause of preventable death. It can affect almost every organ in the body. Smoking puts you and those around you at risk for developing many serious chronic diseases. Quitting smoking can be very challenging. Do not get discouraged if you are not successful the first time. Some people need to make many attempts to quit before they achieve long-term success. Do your best to stick to your quit plan, and talk with your health care provider if you have any questions or concerns. How do I get ready to quit? When you decide to quit smoking, create a plan to help you succeed. Before you quit: Pick a date to quit. Set a date within the next 2 weeks to give you time to prepare. Write down the reasons why you are quitting. Keep this list in places where you will see it often. Tell your family, friends, and co-workers that you are quitting. Support from people you are close to can make quitting easier. Talk with your health care provider about your options for quitting smoking. Find out what treatment options are covered by your health insurance. Identify people, places, things, and activities that make you want to smoke (triggers). Avoid them. What first steps can I take to quit smoking? Throw away all cigarettes at home, at work, and in your car. Throw away smoking accessories, such as ashtrays and lighters. Clean your car. Make sure to empty the ashtray. Clean your home, including curtains and carpets. What strategies can I use to quit smoking? Talk with your health care provider about combining strategies, such as taking medicines while you are also receiving in-person counseling. Using these two strategies together makes you more likely to succeed in quitting than if you used either strategy on its own. If you are pregnant or breastfeeding, talk with your health care provider about finding counseling or other support strategies to quit smoking. Do not  take medicine to help you quit smoking unless your health care provider tells you to. Quit right away Quit smoking completely, instead of gradually reducing how much you smoke over a period of time. Stopping smoking right away may be more successful than gradually quitting. Attend in-person counseling to help you build problem-solving skills. You are more likely to succeed in quitting if you attend counseling sessions regularly. Even short sessions of 10 minutes can be effective. Take medicine You may take medicines to help you quit smoking. Some medicines require a prescription. You can also purchase over-the-counter medicines. Medicines may have nicotine in them to replace the nicotine in cigarettes. Medicines may: Help to stop cravings. Help to relieve withdrawal symptoms. Your health care provider may recommend: Nicotine patches, gum, or lozenges. Nicotine inhalers or sprays. Non-nicotine medicine that you take by mouth. Find resources Find resources and support systems that can help you quit smoking and remain smoke-free after you quit. These resources are most helpful when you use them often. They include: Online chats with a counselor. Telephone quitlines. Printed self-help materials. Support groups or group counseling. Text messaging programs. Mobile phone apps or applications. Use apps that can help you stick to your quit plan by providing reminders, tips, and encouragement. Examples of free services include Quit Guide from the CDC and smokefree.gov  What can I do to make it easier to quit?  Reach out to your family and friends for support and encouragement. Call telephone quitlines, such as 1-800-QUIT-NOW, reach out to support groups, or work with a counselor for   support. Ask people who smoke to avoid smoking around you. Avoid places that trigger you to smoke, such as bars, parties, or smoke-break areas at work. Spend time with people who do not smoke. Lessen the stress in your  life. Stress can be a smoking trigger for some people. To lessen stress, try: Exercising regularly. Doing deep-breathing exercises. Doing yoga. Meditating. What benefits will I see if I quit smoking? Over time, you should start to see positive results, such as: Improved sense of smell and taste. Decreased coughing and sore throat. Slower heart rate. Lower blood pressure. Clearer and healthier skin. The ability to breathe more easily. Fewer sick days. Summary Quitting smoking can be very challenging. Do not get discouraged if you are not successful the first time. Some people need to make many attempts to quit before they achieve long-term success. When you decide to quit smoking, create a plan to help you succeed. Quit smoking right away, not slowly over a period of time. Find resources and support systems that can help you quit smoking and remain smoke-free after you quit. This information is not intended to replace advice given to you by your health care provider. Make sure you discuss any questions you have with your health care provider. Document Revised: 06/08/2021 Document Reviewed: 06/08/2021 Elsevier Patient Education  2023 Elsevier Inc.  

## 2022-08-29 ENCOUNTER — Encounter: Payer: Self-pay | Admitting: Radiology

## 2022-10-21 ENCOUNTER — Other Ambulatory Visit: Payer: Self-pay | Admitting: Family Medicine

## 2022-11-20 ENCOUNTER — Ambulatory Visit: Payer: Medicaid Other | Admitting: Family Medicine

## 2022-11-20 VITALS — BP 109/76 | Ht 67.0 in | Wt 191.0 lb

## 2022-11-20 DIAGNOSIS — F988 Other specified behavioral and emotional disorders with onset usually occurring in childhood and adolescence: Secondary | ICD-10-CM

## 2022-11-20 MED ORDER — LISDEXAMFETAMINE DIMESYLATE 40 MG PO CAPS: ORAL_CAPSULE | ORAL | 0 refills | Status: DC

## 2022-11-20 MED ORDER — LISDEXAMFETAMINE DIMESYLATE 40 MG PO CAPS: 40.0000 mg | ORAL_CAPSULE | ORAL | 0 refills | Status: DC

## 2022-11-20 NOTE — Progress Notes (Unsigned)
   Subjective:    Patient ID: Tracy Flores, female    DOB: [birth year], 32 y.o.   MRN: [id number]  HPI This patient has adult ADD. Takes medication responsibly. Medication does help the patient focus in be more functional. Patient relates that they are or not abusing the medication or misusing the medication. The patient understands that if they're having any negative side effects such as elevated high blood pressure severe headaches they would need stop the medication follow-up immediately. They also understand that the prescriptions are to last for 3 months then the patient will need to follow-up before having further prescriptions.  Patient compliance good compliance  Does medication help patient function /attention better she states medication does make a significant difference  Side effects denies side effects  Patient arrives for a follow up on ADHD  Review of Systems     Objective:   Physical Exam General-in no acute distress Eyes-no discharge Lungs-respiratory rate normal, CTA CV-no murmurs,RRR Extremities skin warm dry no edema Neuro grossly normal Behavior normal, alert        Assessment & Plan:   The patient was seen today as part of the visit regarding ADD.  Patient is stable on current regimen.  Appropriate prescriptions prescribed.  Medications were reviewed with the patient as well as compliance. Side effects were checked for. Discussion regarding effectiveness was held. Prescriptions were electronically sent in.  Patient reminded to follow-up in approximately 3 months.   Plans to [facility], [place] law with drug registry was checked and verified while present with the patient. Patient relates keeps her medicine in a safe place does not miss use it.

## 2022-11-21 ENCOUNTER — Ambulatory Visit: Payer: Medicaid Other | Admitting: Family Medicine

## 2023-01-06 DIAGNOSIS — E663 Overweight: Secondary | ICD-10-CM | POA: Diagnosis not present

## 2023-01-06 DIAGNOSIS — L03116 Cellulitis of left lower limb: Secondary | ICD-10-CM | POA: Diagnosis not present

## 2023-01-06 DIAGNOSIS — Z6828 Body mass index (BMI) 28.0-28.9, adult: Secondary | ICD-10-CM | POA: Diagnosis not present

## 2023-01-14 ENCOUNTER — Encounter: Payer: Self-pay | Admitting: Emergency Medicine

## 2023-01-14 ENCOUNTER — Emergency Department (HOSPITAL_COMMUNITY)
Admission: EM | Admit: 2023-01-14 | Discharge: 2023-01-14 | Discharge status: Against Medical Advice | Payer: Medicaid Other | Attending: Emergency Medicine | Admitting: Emergency Medicine

## 2023-01-14 ENCOUNTER — Encounter (HOSPITAL_COMMUNITY): Payer: Self-pay

## 2023-01-14 ENCOUNTER — Ambulatory Visit
Admission: EM | Admit: 2023-01-14 | Discharge: 2023-01-14 | Disposition: A | Discharge status: Home / Self Care | Payer: Medicaid Other | Attending: Nurse Practitioner | Admitting: Nurse Practitioner

## 2023-01-14 ENCOUNTER — Other Ambulatory Visit: Payer: Self-pay

## 2023-01-14 DIAGNOSIS — M7989 Other specified soft tissue disorders: Secondary | ICD-10-CM

## 2023-01-14 DIAGNOSIS — W57XXXA Bitten or stung by nonvenomous insect and other nonvenomous arthropods, initial encounter: Secondary | ICD-10-CM | POA: Diagnosis not present

## 2023-01-14 DIAGNOSIS — L03116 Cellulitis of left lower limb: Secondary | ICD-10-CM | POA: Diagnosis not present

## 2023-01-14 DIAGNOSIS — Z5321 Procedure and treatment not carried out due to patient leaving prior to being seen by health care provider: Secondary | ICD-10-CM | POA: Insufficient documentation

## 2023-01-14 DIAGNOSIS — S90562A Insect bite (nonvenomous), left ankle, initial encounter: Secondary | ICD-10-CM | POA: Insufficient documentation

## 2023-01-14 DIAGNOSIS — T63441A Toxic effect of venom of bees, accidental (unintentional), initial encounter: Secondary | ICD-10-CM

## 2023-01-14 MED ORDER — PREDNISONE 50 MG PO TABS: ORAL_TABLET | ORAL | 0 refills | Status: DC

## 2023-01-14 MED ORDER — DEXAMETHASONE SODIUM PHOSPHATE 10 MG/ML IJ SOLN
10.0000 mg | INTRAMUSCULAR | Status: AC
  Administered 2023-01-14: 10 mg via INTRAMUSCULAR

## 2023-01-14 NOTE — ED Triage Notes (Signed)
Stung on left foot yesterday.  Foot and ankle swollen and red.  Hx of previous insect bite in that foot and finished bactrim this morning.

## 2023-01-14 NOTE — ED Provider Notes (Signed)
[facility]    CSN: [id number] Arrival date & time: 01/14/23  1746      History   Chief Complaint No chief complaint on file.   HPI Tracy Flores is a 32 y.o. female.   The history is provided by the patient.   The patient presents for swelling and redness of the left foot and leg that started yesterday after she was stung on her left foot.  Patient denies shortness of breath, tongue swelling, lip swelling, difficulty breathing, wheezing, chest pain, or trouble swallowing.  Patient states that she was previously being treated for a another insect bite in the same foot.  She states she was prescribed Bactrim for 7 days.  States that she took the last dose this morning.  Patient reports she has not taken any medication such as Benadryl for her symptoms.  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   Routine cervical smear 02/08/2021   Menorrhagia with regular cycle 02/08/2021   Pregnancy examination or test, negative result 02/08/2021   Anxiety and depression 01/21/2020   Migraine, chronic, without aura 12/23/2019   Moderate dysplasia of cervix (CIN II) 09/30/2019   Periodic limb movements of sleep 08/07/2019   Episodic tension-type headache, not intractable 04/30/2019   ADD (attention deficit disorder) 03/26/2013   Generalized anxiety disorder 11/13/2012   Genital herpes 11/13/2012    Past Surgical History:  Procedure Laterality Date   CERVICAL CONIZATION W/BX N/A 09/21/2019    Procedure: COLD KNIFE CONIZATION CERVIX WITH BIOPSY;  Surgeon: Tilda Burrow, MD;  Location: [facility];  Service: Gynecology;  Laterality: N/A;   NO PAST SURGERIES      OB History     Gravida  1   Para  1   Term  1   Preterm      AB      Living  1      SAB      IAB      Ectopic      Multiple      Live Births  1            Home Medications    Prior to Admission medications   Medication Sig Start Date End Date Taking? Authorizing Provider  lisdexamfetamine (VYVANSE) 40 MG capsule Take 1 capsule (40 mg total) by mouth every morning. 11/20/22   Babs Sciara, MD  lisdexamfetamine (VYVANSE) 40 MG capsule TAKE ONE CAPSULE (40MG  TOTAL) BY MOUTH EVERY MORNING 11/20/22   Babs Sciara, MD  lisdexamfetamine (VYVANSE) 40 MG capsule Take 1 capsule (40 mg total) by mouth every morning. 11/20/22   Babs Sciara, MD    Family History Family History  Problem Relation Age of Onset   Cancer Paternal Grandmother    Hypertension Paternal Grandmother    Diabetes Paternal Grandmother    Arthritis Paternal Grandmother    Heart  disease Paternal Grandmother    Depression Mother    Bipolar disorder Sister    Drug abuse Paternal Aunt     Social History Social History   Tobacco Use   Smoking status: Every Day    Current packs/day: 0.50    Average packs/day: 0.5 packs/day for 5.0 years (2.5 ttl pk-yrs)    Types: Cigarettes   Smokeless tobacco: Never  Vaping Use   Vaping status: Never Used  Substance Use Topics   Alcohol use: No   Drug use: No     Allergies   Abilify [aripiprazole], Celexa [citalopram hydrobromide], Clavulanic acid, Clindamycin/lincomycin, Penicillins, Zoloft [sertraline hcl], and Amoxil [amoxicillin]   Review of Systems Review of Systems Per HPI  Physical Exam Triage Vital Signs ED Triage Vitals  Encounter Vitals Group     BP 01/14/23 1750 117/80     Systolic BP Percentile --      Diastolic BP Percentile --      Pulse Rate 01/14/23 1750  93     Resp 01/14/23 1750 18     Temp 01/14/23 1750 (!) 97.5 F (36.4 C)     Temp Source 01/14/23 1750 Oral     SpO2 01/14/23 1750 98 %     Weight --      Height --      Head Circumference --      Peak Flow --      Pain Score 01/14/23 1751 5     Pain Loc --      Pain Education --      Exclude from Growth Chart --    No data found.  Updated Vital Signs BP 117/80 (BP Location: Right Arm)   Pulse 93   Temp (!) 97.5 F (36.4 C) (Oral)   Resp 18   LMP 12/15/2022 (Approximate)   SpO2 98%   Visual Acuity Right Eye Distance:   Left Eye Distance:   Bilateral Distance:    Right Eye Near:   Left Eye Near:    Bilateral Near:     Physical Exam Vitals and nursing note reviewed.  Constitutional:      General: She is not in acute distress.    Appearance: Normal appearance.  HENT:     Head: Normocephalic.     Mouth/Throat:     Mouth: Mucous membranes are moist.     Comments: Airway is clear and patent, no signs of obstruction.  Eyes:     Extraocular Movements: Extraocular movements intact.     Pupils: Pupils are equal, round, and reactive to light.  Cardiovascular:     Rate and Rhythm: Normal rate and regular rhythm.     Pulses: Normal pulses.     Heart sounds: Normal heart sounds.  Pulmonary:     Effort: Pulmonary effort is normal. No respiratory distress.     Breath sounds: Normal breath sounds. No stridor. No wheezing, rhonchi or rales.  Abdominal:     General: Bowel sounds are normal.     Palpations: Abdomen is soft.     Tenderness: There is no abdominal tenderness.  Musculoskeletal:     Cervical back: Normal range of motion.     Left lower leg: Swelling present.     Left foot: Decreased range of motion (Due to swelling.). Normal capillary refill. Swelling present. Normal pulse.     Comments: Swelling and erythema noted to the left lower extremity that extends down into the left foot.  Area is warm to palpation.  Lymphadenopathy:  Cervical: No cervical  adenopathy.  Skin:    General: Skin is warm and dry.  Neurological:     General: No focal deficit present.     Mental Status: She is alert and oriented to person, place, and time.  Psychiatric:        Mood and Affect: Mood normal.        Behavior: Behavior normal.      UC Treatments / Results  Labs (all labs ordered are listed, but only abnormal results are displayed) Labs Reviewed - No data to display  EKG   Radiology No results found.  Procedures Procedures (including critical care time)  Medications Ordered in UC Medications - No data to display  Initial Impression / Assessment and Plan / UC Course  I have reviewed the triage vital signs and the nursing notes.  Pertinent labs & imaging results that were available during my care of the patient were reviewed by me and considered in my medical decision making (see chart for details).  The patient is well-appearing, she is in no acute distress, vital signs are stable.  Localized reaction to the left lower extremity.  Patient with moderate erythema and swelling present.  Decadron 10 mg IM administered.  Prednisone 50 mg prescribed for patient to take for the next 5 days.  Patient completed prescription for Bactrim this morning.  Will evaluate in 48 hours to determine if antibiotics are necessary at this time.  An appointment was scheduled for the patient to follow-up on 01/17/23.  Supportive care recommendations were provided and discussed with the patient to include taking over-the-counter Benadryl at bedtime, keeping the left lower extremity elevated, and applying ice to help with swelling.  Patient was in agreement with this plan of care and advised to follow-up sooner if symptoms worsen.  All questions were answered.  Patient is stable for discharge.  Final Clinical Impressions(s) / UC Diagnoses   Final diagnoses:  None   Discharge Instructions   None    ED Prescriptions   None    PDMP not reviewed this  encounter.   Abran Cantor, NP 01/14/23 1819

## 2023-01-14 NOTE — ED Triage Notes (Signed)
Pt reports that she just finished abx for a bee sting on her left ankle that they believed was cellutlitis.  Pt reports she got stung again last night in the left ankle about an inch from the other and now she has redness and swelling in her ankle again.  Pt has not taken any benadryl or other meds for sting.

## 2023-01-14 NOTE — Discharge Instructions (Signed)
You were given an injection of Decadron 10 mg. Begin the prednisone on 01/15/2023. Keep the left lower extremity elevated.  Also apply ice to help with swelling. Take Benadryl 25 to 50 mg (1 to 2 tablets) at bedtime. Try to stay off of the left foot/leg is much as possible. I would like for you to follow-up on 01/17/2023 for reevaluation.  As discussed, we will determine if antibiotics are needed at that time. Follow-up as needed.

## 2023-01-17 ENCOUNTER — Ambulatory Visit: Payer: Medicaid Other

## 2023-02-24 ENCOUNTER — Other Ambulatory Visit: Payer: Self-pay | Admitting: Family Medicine

## 2023-02-25 MED ORDER — LISDEXAMFETAMINE DIMESYLATE 40 MG PO CAPS: 40.0000 mg | ORAL_CAPSULE | ORAL | 0 refills | Status: DC

## 2023-03-24 ENCOUNTER — Ambulatory Visit: Payer: Medicaid Other | Admitting: Family Medicine

## 2023-03-24 ENCOUNTER — Encounter: Payer: Self-pay | Admitting: Family Medicine

## 2023-03-24 VITALS — BP 105/70 | HR 89 | Temp 97.9°F | Ht 67.0 in | Wt 193.0 lb

## 2023-03-24 DIAGNOSIS — F988 Other specified behavioral and emotional disorders with onset usually occurring in childhood and adolescence: Secondary | ICD-10-CM

## 2023-03-24 DIAGNOSIS — Z23 Encounter for immunization: Secondary | ICD-10-CM | POA: Diagnosis not present

## 2023-03-24 DIAGNOSIS — L84 Corns and callosities: Secondary | ICD-10-CM

## 2023-03-24 MED ORDER — LISDEXAMFETAMINE DIMESYLATE 40 MG PO CAPS: ORAL_CAPSULE | ORAL | 0 refills | Status: DC

## 2023-03-24 MED ORDER — LISDEXAMFETAMINE DIMESYLATE 40 MG PO CAPS: 40.0000 mg | ORAL_CAPSULE | ORAL | 0 refills | Status: DC

## 2023-03-24 NOTE — Progress Notes (Addendum)
Subjective:    Patient ID: Tracy Flores, female    DOB: December 01, 1990, 32 y.o.   MRN: [id number] This verifies that the history review of any tests, physical exam, assessment and plan were conducted by Dr. Lilyan Punt and documented accordingly by him today Lilyan Punt MD primary care [facility] [place] [facility]  HPI This patient has adult ADD. Takes medication responsibly. Medication does help the patient focus in be more functional. Patient relates that they are or not abusing the medication or misusing the medication. The patient understands that if they're having any negative side effects such as elevated high blood pressure severe headaches they would need stop the medication follow-up immediately. They also understand that the prescriptions are to last for 3 months then the patient will need to follow-up before having further prescriptions.  Patient compliance yes  Does medication help patient function /attention better  yes  Side effects  no   Patient with a corn between her fourth and fifth digit left foot  Review of Systems     Objective:   Physical Exam  General-in no acute distress Eyes-no discharge Lungs-respiratory rate normal, CTA CV-no murmurs,RRR Extremities skin warm dry no edema Neuro grossly normal Behavior normal, alert       Assessment & Plan:  Thickened corn-30% salicylic acid applied nightly rinse off in the morning use a wet emery board to make the corn thinner if running into problems notify us and we will set up podiatry also tennis shoe with a wide toebox plus use moleskin when possible  The patient was seen today as part of the visit regarding ADD.  Patient is stable on current regimen.  Appropriate prescriptions prescribed.  Medications were reviewed with the patient as well as compliance. Side effects were checked for. Discussion regarding effectiveness was held. Prescriptions were electronically sent in.  Patient reminded to follow-up in  approximately 3 months.   Plans to [facility] law with drug registry was checked and verified while present with the patient. Follow-up in 4 months

## 2023-05-05 DIAGNOSIS — J069 Acute upper respiratory infection, unspecified: Secondary | ICD-10-CM | POA: Diagnosis not present

## 2023-05-05 DIAGNOSIS — R059 Cough, unspecified: Secondary | ICD-10-CM | POA: Diagnosis not present

## 2023-05-05 DIAGNOSIS — R519 Headache, unspecified: Secondary | ICD-10-CM | POA: Diagnosis not present

## 2023-05-28 ENCOUNTER — Other Ambulatory Visit: Payer: Self-pay | Admitting: Family Medicine

## 2023-06-10 DIAGNOSIS — M79672 Pain in left foot: Secondary | ICD-10-CM | POA: Diagnosis not present

## 2023-06-16 ENCOUNTER — Ambulatory Visit (INDEPENDENT_AMBULATORY_CARE_PROVIDER_SITE_OTHER): Payer: Medicaid Other

## 2023-06-16 ENCOUNTER — Encounter: Payer: Self-pay | Admitting: Podiatry

## 2023-06-16 ENCOUNTER — Ambulatory Visit: Payer: Medicaid Other | Admitting: Podiatry

## 2023-06-16 DIAGNOSIS — M778 Other enthesopathies, not elsewhere classified: Secondary | ICD-10-CM | POA: Diagnosis not present

## 2023-06-16 DIAGNOSIS — M722 Plantar fascial fibromatosis: Secondary | ICD-10-CM | POA: Diagnosis not present

## 2023-06-16 DIAGNOSIS — M7752 Other enthesopathy of left foot: Secondary | ICD-10-CM

## 2023-06-16 DIAGNOSIS — M7751 Other enthesopathy of right foot: Secondary | ICD-10-CM

## 2023-06-16 MED ORDER — MELOXICAM 15 MG PO TABS: 15.0000 mg | ORAL_TABLET | Freq: Every day | ORAL | 2 refills | Status: DC | PRN

## 2023-06-16 NOTE — Progress Notes (Unsigned)
Subjective:   Patient ID: Tracy Flores, female   DOB: 32 y.o.   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 Right foot worse then the left.   32 year old female presents the office the above concerns.  She has pain in the top of her left foot and she states it is hurting even when not working she does some swelling to the top of her foot.  Send more recently the last couple weeks.  She is on the pain above her right heel mostly to be on her feet for a long time and after working.  No swelling.  This been ongoing much longer.  No recent treatment for either issues.  No injuries.   Review of Systems  All other systems reviewed and are negative.  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   IBS (irritable bowel syndrome)    Interstitial cystitis    Major depression    OCD (obsessive compulsive disorder)    Pregnancy    Reactive airway disease    Reflux    Restless legs    URI (upper respiratory infection)    Vaginal Pap smear, abnormal     Past Surgical History:  Procedure Laterality Date   CERVICAL CONIZATION W/BX N/A 09/21/2019   Procedure: COLD KNIFE CONIZATION CERVIX WITH BIOPSY;  Surgeon: Tilda Burrow, MD;  Location: [facility];  Service: Gynecology;  Laterality: N/A;   NO PAST SURGERIES       Current Outpatient Medications:    lisdexamfetamine (VYVANSE) 40 MG capsule, Take 1 capsule (40 mg total) by mouth every morning., Disp: 30 capsule, Rfl: 0   lisdexamfetamine (VYVANSE) 40 MG capsule, TAKE ONE CAPSULE (40MG  TOTAL) BY MOUTH EVERY MORNING, Disp: 30 capsule, Rfl: 0   lisdexamfetamine (VYVANSE) 40 MG capsule, TAKE ONE CAPSULE (40MG  TOTAL) BY MOUTH EVERY MORNING, Disp: 30 capsule, Rfl: 0   meloxicam (MOBIC) 15  MG tablet, Take 1 tablet (15 mg total) by mouth daily as needed for pain., Disp: 30 tablet, Rfl: 2   predniSONE (DELTASONE) 50 MG tablet, Take 1 tablet by mouth daily with breakfast for the next 5 days., Disp: 5 tablet, Rfl: 0  Allergies  Allergen Reactions   Abilify [Aripiprazole]     Restless legs   Celexa [Citalopram Hydrobromide]     Agitation    Clavulanic Acid    Clindamycin/Lincomycin    Penicillins Other (See Comments)    Unknown Childhood reaction   Zoloft [Sertraline Hcl]     Suicidal thoughts   Amoxil [Amoxicillin] Rash           Objective:  Physical Exam  General: AAO x3, NAD  Dermatological: Skin is warm, dry and supple bilateral. There are no open sores, no preulcerative lesions, no rash or signs of infection present.  Vascular: Dorsalis Pedis artery and Posterior Tibial artery pedal pulses are 2/4 bilateral with immedate capillary fill time.  There is no pain with calf compression, swelling, warmth, erythema.   Neruologic: Grossly intact via light touch bilateral.   Musculoskeletal: There is tenderness palpation of the plantar medial tubercle of the calcaneus at the insertion of plantar fascia on the right foot.  There  is no pain with lateral compression of calcaneus.  No pain Achilles tendon.  There is no other areas of pinpoint tenderness in the right foot.  The left foot there is no edema of the dorsal aspect of the forefoot and there is pinpoint tenderness of the along the metatarsals laterally.  Flexor, extensor tendons appear to be intact.  MMT/5.  Gait: Unassisted, Nonantalgic.       Assessment:   Concern for stress fracture left foot; plantar fasciitis right foot     Plan:  -Treatment options discussed including all alternatives, risks, and complications -Etiology of symptoms were discussed -X-rays were obtained and reviewed with the patient.  Multiple views bilateral feet were obtained.  No evidence of acute fracture bilaterally. -For the left  foot although no significant signs of fracture on x-ray she does have symptoms of a stress fracture.  Recommend elevation in surgical shoe which is dispensed today.  Once her symptoms resolved and started transition back to a shoe with good arch support. -On the right foot discussed stretching, icing regular.  Dispensed plantar fascia socks for the plantar fascia. -Prescribed mobic. Discussed side effects of the medication and directed to stop if any are to occur and call the office.   Return in about 6 weeks (around 07/28/2023), or if symptoms worsen or fail to improve, for stress fracture left; right plantar fasciitis .  Vivi Barrack DPM

## 2023-06-16 NOTE — Patient Instructions (Signed)
If was nice to meet you today. If you have any questions or any further concerns, please feel fee to give me a call. You can call our office at [phone] or please feel fee to send me a message through MyChart.   ----  For instructions on how to put on your Plantar Fascial Brace, please visit [email or web address]  ---     Plantar Fasciitis (Heel Spur Syndrome) with Rehab The plantar fascia is a fibrous, ligament-like, soft-tissue structure that spans the bottom of the foot. Plantar fasciitis is a condition that causes pain in the foot due to inflammation of the tissue. SYMPTOMS  Pain and tenderness on the underneath side of the foot. Pain that worsens with standing or walking. CAUSES  Plantar fasciitis is caused by irritation and injury to the plantar fascia on the underneath side of the foot. Common mechanisms of injury include: Direct trauma to bottom of the foot. Damage to a small nerve that runs under the foot where the main fascia attaches to the heel bone. Stress placed on the plantar fascia due to bone spurs. RISK INCREASES WITH:  Activities that place stress on the plantar fascia (running, jumping, pivoting, or cutting). Poor strength and flexibility. Improperly fitted shoes. Tight calf muscles. Flat feet. Failure to warm-up properly before activity. Obesity. PREVENTION Warm up and stretch properly before activity. Allow for adequate recovery between workouts. Maintain physical fitness: Strength, flexibility, and endurance. Cardiovascular fitness. Maintain a health body weight. Avoid stress on the plantar fascia. Wear properly fitted shoes, including arch supports for individuals who have flat feet.  PROGNOSIS  If treated properly, then the symptoms of plantar fasciitis usually resolve without surgery. However, occasionally surgery is necessary.  RELATED COMPLICATIONS  Recurrent symptoms that may result in a chronic condition. Problems of the lower back  that are caused by compensating for the injury, such as limping. Pain or weakness of the foot during push-off following surgery. Chronic inflammation, scarring, and partial or complete fascia tear, occurring more often from repeated injections.  TREATMENT  Treatment initially involves the use of ice and medication to help reduce pain and inflammation. The use of strengthening and stretching exercises may help reduce pain with activity, especially stretches of the Achilles tendon. These exercises may be performed at home or with a therapist. Your caregiver may recommend that you use heel cups of arch supports to help reduce stress on the plantar fascia. Occasionally, corticosteroid injections are given to reduce inflammation. If symptoms persist for greater than 6 months despite non-surgical (conservative), then surgery may be recommended.   MEDICATION  If pain medication is necessary, then nonsteroidal anti-inflammatory medications, such as aspirin and ibuprofen, or other minor pain relievers, such as acetaminophen, are often recommended. Do not take pain medication within 7 days before surgery. Prescription pain relievers may be given if deemed necessary by your caregiver. Use only as directed and only as much as you need. Corticosteroid injections may be given by your caregiver. These injections should be reserved for the most serious cases, because they may only be given a certain number of times.  HEAT AND COLD Cold treatment (icing) relieves pain and reduces inflammation. Cold treatment should be applied for 10 to 15 minutes every 2 to 3 hours for inflammation and pain and immediately after any activity that aggravates your symptoms. Use ice packs or massage the area with a piece of ice (ice massage). Heat treatment may be used prior to performing the stretching and strengthening activities prescribed by your caregiver,   physical therapist, or athletic trainer. Use a heat pack or soak the injury  in warm water.  SEEK IMMEDIATE MEDICAL CARE IF: Treatment seems to offer no benefit, or the condition worsens. Any medications produce adverse side effects.  EXERCISES- RANGE OF MOTION (ROM) AND STRETCHING EXERCISES - Plantar Fasciitis (Heel Spur Syndrome) These exercises may help you when beginning to rehabilitate your injury. Your symptoms may resolve with or without further involvement from your physician, physical therapist or athletic trainer. While completing these exercises, remember:  Restoring tissue flexibility helps normal motion to return to the joints. This allows healthier, less painful movement and activity. An effective stretch should be held for at least 30 seconds. A stretch should never be painful. You should only feel a gentle lengthening or release in the stretched tissue.  RANGE OF MOTION - Toe Extension, Flexion Sit with your right / left leg crossed over your opposite knee. Grasp your toes and gently pull them back toward the top of your foot. You should feel a stretch on the bottom of your toes and/or foot. Hold this stretch for 10 seconds. Now, gently pull your toes toward the bottom of your foot. You should feel a stretch on the top of your toes and or foot. Hold this stretch for 10 seconds. Repeat  times. Complete this stretch 3 times per day.   RANGE OF MOTION - Ankle Dorsiflexion, Active Assisted Remove shoes and sit on a chair that is preferably not on a carpeted surface. Place right / left foot under knee. Extend your opposite leg for support. Keeping your heel down, slide your right / left foot back toward the chair until you feel a stretch at your ankle or calf. If you do not feel a stretch, slide your bottom forward to the edge of the chair, while still keeping your heel down. Hold this stretch for 10 seconds. Repeat 3 times. Complete this stretch 2 times per day.   STRETCH  Gastroc, Standing Place hands on wall. Extend right / left leg, keeping the  front knee somewhat bent. Slightly point your toes inward on your back foot. Keeping your right / left heel on the floor and your knee straight, shift your weight toward the wall, not allowing your back to arch. You should feel a gentle stretch in the right / left calf. Hold this position for 10 seconds. Repeat 3 times. Complete this stretch 2 times per day.  STRETCH  Soleus, Standing Place hands on wall. Extend right / left leg, keeping the other knee somewhat bent. Slightly point your toes inward on your back foot. Keep your right / left heel on the floor, bend your back knee, and slightly shift your weight over the back leg so that you feel a gentle stretch deep in your back calf. Hold this position for 10 seconds. Repeat 3 times. Complete this stretch 2 times per day.  STRETCH  Gastrocsoleus, Standing  Note: This exercise can place a lot of stress on your foot and ankle. Please complete this exercise only if specifically instructed by your caregiver.  Place the ball of your right / left foot on a step, keeping your other foot firmly on the same step. Hold on to the wall or a rail for balance. Slowly lift your other foot, allowing your body weight to press your heel down over the edge of the step. You should feel a stretch in your right / left calf. Hold this position for 10 seconds. Repeat this exercise with a   slight bend in your right / left knee. Repeat 3 times. Complete this stretch 2 times per day.   STRENGTHENING EXERCISES - Plantar Fasciitis (Heel Spur Syndrome)  These exercises may help you when beginning to rehabilitate your injury. They may resolve your symptoms with or without further involvement from your physician, physical therapist or athletic trainer. While completing these exercises, remember:  Muscles can gain both the endurance and the strength needed for everyday activities through controlled exercises. Complete these exercises as instructed by your physician,  physical therapist or athletic trainer. Progress the resistance and repetitions only as guided.  STRENGTH - Towel Curls Sit in a chair positioned on a non-carpeted surface. Place your foot on a towel, keeping your heel on the floor. Pull the towel toward your heel by only curling your toes. Keep your heel on the floor. Repeat 3 times. Complete this exercise 2 times per day.  STRENGTH - Ankle Inversion Secure one end of a rubber exercise band/tubing to a fixed object (table, pole). Loop the other end around your foot just before your toes. Place your fists between your knees. This will focus your strengthening at your ankle. Slowly, pull your big toe up and in, making sure the band/tubing is positioned to resist the entire motion. Hold this position for 10 seconds. Have your muscles resist the band/tubing as it slowly pulls your foot back to the starting position. Repeat 3 times. Complete this exercises 2 times per day.  Document Released: 06/17/2005 Document Revised: 09/09/2011 Document Reviewed: 09/29/2008 [employer] Patient Information 2014 [employer].  

## 2023-06-30 ENCOUNTER — Other Ambulatory Visit: Payer: Self-pay | Admitting: Family Medicine

## 2023-06-30 MED ORDER — LISDEXAMFETAMINE DIMESYLATE 40 MG PO CAPS: 40.0000 mg | ORAL_CAPSULE | ORAL | 0 refills | Status: DC

## 2023-07-05 ENCOUNTER — Other Ambulatory Visit: Payer: Self-pay

## 2023-07-05 ENCOUNTER — Emergency Department (HOSPITAL_COMMUNITY)
Admission: EM | Admit: 2023-07-05 | Discharge: 2023-07-05 | Disposition: A | Discharge status: Home / Self Care | Payer: Medicaid Other | Attending: Emergency Medicine | Admitting: Emergency Medicine

## 2023-07-05 ENCOUNTER — Emergency Department (HOSPITAL_COMMUNITY): Payer: Medicaid Other

## 2023-07-05 ENCOUNTER — Encounter (HOSPITAL_COMMUNITY): Payer: Self-pay

## 2023-07-05 DIAGNOSIS — M7989 Other specified soft tissue disorders: Secondary | ICD-10-CM | POA: Diagnosis not present

## 2023-07-05 DIAGNOSIS — S52502A Unspecified fracture of the lower end of left radius, initial encounter for closed fracture: Secondary | ICD-10-CM | POA: Insufficient documentation

## 2023-07-05 DIAGNOSIS — S52612A Displaced fracture of left ulna styloid process, initial encounter for closed fracture: Secondary | ICD-10-CM | POA: Diagnosis not present

## 2023-07-05 DIAGNOSIS — W19XXXA Unspecified fall, initial encounter: Secondary | ICD-10-CM | POA: Diagnosis not present

## 2023-07-05 DIAGNOSIS — S6992XA Unspecified injury of left wrist, hand and finger(s), initial encounter: Secondary | ICD-10-CM | POA: Diagnosis present

## 2023-07-05 DIAGNOSIS — Y990 Civilian activity done for income or pay: Secondary | ICD-10-CM | POA: Diagnosis not present

## 2023-07-05 DIAGNOSIS — S52572A Other intraarticular fracture of lower end of left radius, initial encounter for closed fracture: Secondary | ICD-10-CM | POA: Diagnosis not present

## 2023-07-05 MED ORDER — FENTANYL CITRATE PF 50 MCG/ML IJ SOSY
50.0000 ug | PREFILLED_SYRINGE | Freq: Once | INTRAMUSCULAR | Status: AC
  Administered 2023-07-05: 50 ug via INTRAVENOUS
  Filled 2023-07-05: qty 1

## 2023-07-05 MED ORDER — OXYCODONE-ACETAMINOPHEN 5-325 MG PO TABS: 1.0000 | ORAL_TABLET | Freq: Three times a day (TID) | ORAL | 0 refills | Status: AC | PRN

## 2023-07-05 MED ORDER — OXYCODONE-ACETAMINOPHEN 5-325 MG PO TABS
1.0000 | ORAL_TABLET | Freq: Once | ORAL | Status: DC
Start: 2023-07-05 — End: 2023-07-05

## 2023-07-05 NOTE — ED Notes (Signed)
 Ice pack given for patients wrist

## 2023-07-05 NOTE — ED Triage Notes (Addendum)
 Patient come in from a fall at work, Stated fell and was trying to break her fall with her arms and landed on left arm. Now has pain from mid forearm down. Has sensation to hand. Can barely move finder to left hand.

## 2023-07-05 NOTE — Discharge Instructions (Addendum)
 It was a pleasure caring for you today. Please follow up with Dr. Margrette with Hand surgery. Please call his office today or first thing Monday. Seek emergency care if experiencing any new or worsening symptoms.   Xray findings:  1. Acute impacted mildly displaced dorsally angulated and comminuted intra-articular fracture of the distal left radius, as above. 2. Minimally displaced ulnar styloid avulsion fracture.  Alternating between 650 mg Tylenol  and 400 mg Advil : The best way to alternate taking Acetaminophen  (example Tylenol ) and Ibuprofen  (example Advil /Motrin ) is to take them 3 hours apart. For example, if you take ibuprofen  at 6 am you can then take Tylenol  at 9 am. You can continue this regimen throughout the day, making sure you do not exceed the recommended maximum dose for each drug.

## 2023-07-05 NOTE — ED Provider Notes (Signed)
 [facility] AT [facility] Provider Note   CSN: [id number] Arrival date & time: 07/05/23  0900     History  Chief Complaint  Patient presents with   Tracy Flores is a 33 y.o. female with PMHx GERD, HSV-2, anxiety, IBS, depression, who presents to ED concerned for left forearm and wrist pain after a fall approx 30 minutes before arrival. Patient tripped, fell, and extended out this arm to brace her fall. Denies any infectious symptoms today.   Fall       Home Medications Prior to Admission medications   Medication Sig Start Date End Date Taking? Authorizing Provider  oxyCODONE -acetaminophen  (PERCOCET/ROXICET) 5-325 MG tablet Take 1 tablet by mouth every 8 (eight) hours as needed for up to 5 days for severe pain (pain score 7-10). 07/05/23 07/10/23 Yes Hoy Fraction F, PA-C  lisdexamfetamine (VYVANSE ) 40 MG capsule Take 1 capsule (40 mg total) by mouth every morning. 03/24/23   Alphonsa Glendia LABOR, MD  lisdexamfetamine (VYVANSE ) 40 MG capsule TAKE ONE CAPSULE (40MG  TOTAL) BY MOUTH EVERY MORNING 03/24/23   Alphonsa Glendia LABOR, MD  lisdexamfetamine (VYVANSE ) 40 MG capsule Take 1 capsule (40 mg total) by mouth every morning. 06/30/23   Alphonsa Glendia LABOR, MD  meloxicam  (MOBIC ) 15 MG tablet Take 1 tablet (15 mg total) by mouth daily as needed for pain. 06/16/23 06/15/24  Gershon Donnice SAUNDERS, DPM  predniSONE  (DELTASONE ) 50 MG tablet Take 1 tablet by mouth daily with breakfast for the next 5 days. 01/14/23   Leath-Warren, Etta PARAS, NP      Allergies    Abilify [aripiprazole], Celexa [citalopram hydrobromide], Clavulanic acid, Clindamycin/lincomycin, Penicillins, Zoloft [sertraline hcl], and Amoxil [amoxicillin]    Review of Systems   Review of Systems  Musculoskeletal:        Arm pain    Physical Exam Updated Vital Signs BP 120/69   Pulse 74   Temp 98.1 F (36.7 C) (Oral)   Resp 17   Ht 5' 8 (1.727 m)   Wt 86.2 kg   SpO2 92%   BMI 28.89 kg/m   Physical Exam Vitals and nursing note reviewed.  Constitutional:      General: She is not in acute distress.    Appearance: She is not ill-appearing or toxic-appearing.  HENT:     Head: Normocephalic and atraumatic.  Eyes:     General: No scleral icterus.       Right eye: No discharge.        Left eye: No discharge.     Conjunctiva/sclera: Conjunctivae normal.  Cardiovascular:     Rate and Rhythm: Normal rate.  Pulmonary:     Effort: Pulmonary effort is normal.  Abdominal:     General: Abdomen is flat.  Musculoskeletal:     Comments: No swelling or erythema of left arm. +2 radial pulse. Sensation to light touch intact. Restricted ROM of fingers and wrist d/t pain. No deformity. Area non-tense.  Skin:    General: Skin is warm and dry.  Neurological:     General: No focal deficit present.     Mental Status: She is alert. Mental status is at baseline.  Psychiatric:        Mood and Affect: Mood normal.        Behavior: Behavior normal.     ED Results / Procedures / Treatments   Labs (all labs ordered are listed, but only abnormal results are displayed) Labs Reviewed - No data  to display  EKG None  Radiology DG Forearm Left Result Date: 07/05/2023 CLINICAL DATA:  33 year old female with history of trauma from a fall complaining of pain in the left wrist and distal forearm. EXAM: LEFT WRIST - COMPLETE 3+ VIEW; LEFT FOREARM - 2 VIEW COMPARISON:  No priors. FINDINGS: Multiple views of the left forearm and wrist demonstrate an acute comminuted intra-articular fracture of the distal radius which appears mildly impacted with mild (approximately 15 degrees) of dorsal angulation and mild displacement of fracture fragments, predominantly dorsally. Minimally displaced ulnar styloid avulsion fracture also noted. Soft tissue swelling surrounding the wrist joint. Carpals appear intact. IMPRESSION: 1. Acute impacted mildly displaced dorsally angulated and comminuted intra-articular fracture of  the distal left radius, as above. 2. Minimally displaced ulnar styloid avulsion fracture. Electronically Signed   By: Toribio Aye M.D.   On: 07/05/2023 09:58   DG Wrist Complete Left Result Date: 07/05/2023 CLINICAL DATA:  33 year old female with history of trauma from a fall complaining of pain in the left wrist and distal forearm. EXAM: LEFT WRIST - COMPLETE 3+ VIEW; LEFT FOREARM - 2 VIEW COMPARISON:  No priors. FINDINGS: Multiple views of the left forearm and wrist demonstrate an acute comminuted intra-articular fracture of the distal radius which appears mildly impacted with mild (approximately 15 degrees) of dorsal angulation and mild displacement of fracture fragments, predominantly dorsally. Minimally displaced ulnar styloid avulsion fracture also noted. Soft tissue swelling surrounding the wrist joint. Carpals appear intact. IMPRESSION: 1. Acute impacted mildly displaced dorsally angulated and comminuted intra-articular fracture of the distal left radius, as above. 2. Minimally displaced ulnar styloid avulsion fracture. Electronically Signed   By: Toribio Aye M.D.   On: 07/05/2023 09:58    Procedures Procedures    Medications Ordered in ED Medications  fentaNYL  (SUBLIMAZE ) injection 50 mcg (50 mcg Intravenous Given 07/05/23 1207)    ED Course/ Medical Decision Making/ A&P                                 Medical Decision Making Amount and/or Complexity of Data Reviewed Radiology: ordered.   This patient presents to the ED for concern of left arm/wrist pain, this involves an extensive number of treatment options, and is a complaint that carries with it a high risk of complications and morbidity.  The differential diagnosis includes hemarthrosis, gout, septic joint, fracture, tendonitis, carpal tunnel syndrome, muscle strain, compartment syndrome   Co morbidities that complicate the patient evaluation  none   Additional history obtained:  Dr. Alphonsa PCP   Problem List  / ED Course / Critical interventions / Medication management  Patient presents to ED concerned for forearm/wrist pain after extending out her arm to brace a fall.  Left hand/wrist ROM restricted due to pain.  Rest of physical exam reassuring.  Left arm/hand appears neurovascularly intact. I ordered imaging studies including left forearm/wrist xray to assess for complications given patient's fall and arm pain. I independently visualized and interpreted imaging which shows mildly displaced comminuted intra-articular fracture of the distal left radius and mildly displaced ulnar styloid avulsion fracture. I agree with the radiologist interpretation. Shared results with patient. Provided patient with Fentanyl  which she tolerated well. Placed patient in finger traps for traction as we applied sugar-tong splint.  This area was splinted appropriately and hand is still neurovascularly intact.  Educated patient to alternate ibuprofen  and Tylenol  for pain management.  Will also provide patient with Percocet  for breakthrough pain.  Educated patient that she will need to follow-up with hand surgeon Dr. Margrette and will need to call him first thing Monday morning.  Patient verbalized understanding of plan. I have reviewed the patients home medicines and have made adjustments as needed Patient afebrile with stable vitals.  Provided with return precautions.  Discharged in good condition.   Ddx these are considered less likely due to history of present illness and physical exam -hemarthrosis: joint without swelling; ROM intact -gout: no warmth or erythema; ROM intact  -septic joint: afebrile; no warmth or erythema; no skin changes; ROM intact  -compartment syndrome: area not tense; neurovascularly intact   Social Determinants of Health:  None         Final Clinical Impression(s) / ED Diagnoses Final diagnoses:  Fall, initial encounter  Closed fracture of distal end of left radius, unspecified fracture  morphology, initial encounter  Closed displaced fracture of styloid process of left ulna, initial encounter    Rx / DC Orders ED Discharge Orders          Ordered    oxyCODONE -acetaminophen  (PERCOCET/ROXICET) 5-325 MG tablet  Every 8 hours PRN        07/05/23 1228              Hoy Nidia FALCON, [place] 07/05/23 1232    Randol Simmonds, MD 07/05/23 1745

## 2023-07-08 DIAGNOSIS — S52502A Unspecified fracture of the lower end of left radius, initial encounter for closed fracture: Secondary | ICD-10-CM | POA: Diagnosis not present

## 2023-07-08 DIAGNOSIS — S52509A Unspecified fracture of the lower end of unspecified radius, initial encounter for closed fracture: Secondary | ICD-10-CM | POA: Insufficient documentation

## 2023-07-09 ENCOUNTER — Encounter (HOSPITAL_BASED_OUTPATIENT_CLINIC_OR_DEPARTMENT_OTHER): Payer: Self-pay | Admitting: Orthopedic Surgery

## 2023-07-09 ENCOUNTER — Other Ambulatory Visit: Payer: Self-pay

## 2023-07-09 NOTE — Progress Notes (Signed)
   07/09/23 1430  Pre-op Phone Call  Surgery Date Verified 07/16/23  Arrival Time Verified 1145  Surgery Location Verified [facility]  Medical History Reviewed Yes  Is the patient taking a GLP-1 receptor agonist? No  Does the patient have diabetes? No diagnosis of diabetes  Do you have a history of heart problems? No  Antiarrhythmic device type  (NA)  Does patient have other implanted devices? No  Patient Teaching Pre / Post Procedure  Patient educated about smoking cessation 24 hours prior to surgery. Yes  THA/TKA patients only:  By your surgery date, will you have been taking narcotics for 90 days or greater? No  Med Rec Completed Yes  Take the Following Meds the Morning of Surgery no meds AM of sx, hold nsaid/herb/vit x5d  Recent  Lab Work, EKG, CXR? No  NPO (Including gum & candy) After midnight  Allowed clear liquids Water (one bottle water at 0600)  Patient instructed to stop clear liquids including Carb loading drink at: 0600  Stop Solids, Milk, Candy, and Gum STARTING AT MIDNIGHT  Responsible adult to drive and be with you for 24 hours? Yes  Name & Phone Number for Ride/Caregiver sister, Lacinda  No Jewelry, money, nail polish or make-up.  No lotions, powders, perfumes. No shaving  48 hrs. prior to surgery. Yes  Contacts, Dentures & Glasses Will Have to be Removed Before OR. Yes  Please bring your ID and Insurance Card the morning of your surgery. (Surgery Centers Only) Yes  Bring any papers or x-rays with you that your surgeon gave you. Yes  Instructed to contact the location of procedure/ provider if they or anyone in their household develops symptoms or tests positive for COVID-19, has close contact with someone who tests positive for COVID, or has known exposure to any contagious illness. Yes  Call this number the morning of surgery  with any problems that may cancel your surgery. [phone]  Covid-19 Assessment  Have you had a positive COVID-19 test within the previous 90  days? No  COVID Testing Guidance Proceed with the additional questions.  Patient's surgery required a COVID-19 test (cardiothoracic, complex ENT, and bronchoscopies/ EBUS) No  Have you been unmasked and in close contact with anyone with COVID-19 or COVID-19 symptoms within the past 10 days? No  Do you or anyone in your household currently have any COVID-19 symptoms? No

## 2023-07-16 ENCOUNTER — Ambulatory Visit (HOSPITAL_BASED_OUTPATIENT_CLINIC_OR_DEPARTMENT_OTHER): Payer: Medicaid Other | Admitting: Certified Registered Nurse Anesthetist

## 2023-07-16 ENCOUNTER — Ambulatory Visit (HOSPITAL_BASED_OUTPATIENT_CLINIC_OR_DEPARTMENT_OTHER)
Admission: RE | Admit: 2023-07-16 | Discharge: 2023-07-16 | Disposition: A | Discharge status: Home / Self Care | Payer: Medicaid Other | Attending: Orthopedic Surgery | Admitting: Orthopedic Surgery

## 2023-07-16 ENCOUNTER — Encounter (HOSPITAL_BASED_OUTPATIENT_CLINIC_OR_DEPARTMENT_OTHER): Payer: Self-pay | Admitting: Orthopedic Surgery

## 2023-07-16 ENCOUNTER — Encounter (HOSPITAL_BASED_OUTPATIENT_CLINIC_OR_DEPARTMENT_OTHER)
Admission: RE | Disposition: A | Discharge status: Home / Self Care | Payer: Self-pay | Source: Home / Self Care | Attending: Orthopedic Surgery

## 2023-07-16 ENCOUNTER — Other Ambulatory Visit: Payer: Self-pay

## 2023-07-16 ENCOUNTER — Ambulatory Visit (HOSPITAL_BASED_OUTPATIENT_CLINIC_OR_DEPARTMENT_OTHER): Payer: Medicaid Other

## 2023-07-16 DIAGNOSIS — S52612A Displaced fracture of left ulna styloid process, initial encounter for closed fracture: Secondary | ICD-10-CM | POA: Insufficient documentation

## 2023-07-16 DIAGNOSIS — W1830XA Fall on same level, unspecified, initial encounter: Secondary | ICD-10-CM | POA: Diagnosis not present

## 2023-07-16 DIAGNOSIS — S52502A Unspecified fracture of the lower end of left radius, initial encounter for closed fracture: Secondary | ICD-10-CM

## 2023-07-16 DIAGNOSIS — F17210 Nicotine dependence, cigarettes, uncomplicated: Secondary | ICD-10-CM | POA: Insufficient documentation

## 2023-07-16 DIAGNOSIS — S52572A Other intraarticular fracture of lower end of left radius, initial encounter for closed fracture: Secondary | ICD-10-CM | POA: Diagnosis present

## 2023-07-16 DIAGNOSIS — Z419 Encounter for procedure for purposes other than remedying health state, unspecified: Secondary | ICD-10-CM

## 2023-07-16 HISTORY — PX: OPEN REDUCTION INTERNAL FIXATION (ORIF) DISTAL RADIAL FRACTURE: SHX5989

## 2023-07-16 LAB — POCT PREGNANCY, URINE: Preg Test, Ur: NEGATIVE

## 2023-07-16 SURGERY — OPEN REDUCTION INTERNAL FIXATION (ORIF) DISTAL RADIUS FRACTURE
Anesthesia: Monitor Anesthesia Care | Site: Wrist | Laterality: Left

## 2023-07-16 MED ORDER — ACETAMINOPHEN 500 MG PO TABS: 1000.0000 mg | ORAL_TABLET | Freq: Once | ORAL | Status: DC

## 2023-07-16 MED ORDER — MIDAZOLAM HCL 2 MG/2ML IJ SOLN
INTRAMUSCULAR | Status: AC
  Filled 2023-07-16: qty 2

## 2023-07-16 MED ORDER — DROPERIDOL 2.5 MG/ML IJ SOLN: 0.6250 mg | Freq: Once | INTRAMUSCULAR | Status: DC | PRN

## 2023-07-16 MED ORDER — OXYCODONE HCL 5 MG PO TABS: 5.0000 mg | ORAL_TABLET | ORAL | 0 refills | Status: DC | PRN

## 2023-07-16 MED ORDER — FENTANYL CITRATE (PF) 100 MCG/2ML IJ SOLN
INTRAMUSCULAR | Status: AC
  Filled 2023-07-16: qty 2

## 2023-07-16 MED ORDER — ACETAMINOPHEN 10 MG/ML IV SOLN
INTRAVENOUS | Status: AC
Start: 2023-07-16 — End: ?
  Filled 2023-07-16: qty 100

## 2023-07-16 MED ORDER — ACETAMINOPHEN 10 MG/ML IV SOLN
INTRAVENOUS | Status: DC | PRN
  Administered 2023-07-16: 1000 mg via INTRAVENOUS

## 2023-07-16 MED ORDER — MIDAZOLAM HCL 5 MG/5ML IJ SOLN
INTRAMUSCULAR | Status: DC | PRN
  Administered 2023-07-16 (×2): 1 mg via INTRAVENOUS

## 2023-07-16 MED ORDER — ACETAMINOPHEN 500 MG PO TABS
ORAL_TABLET | ORAL | Status: AC
  Filled 2023-07-16: qty 2

## 2023-07-16 MED ORDER — CEFAZOLIN SODIUM-DEXTROSE 2-4 GM/100ML-% IV SOLN
INTRAVENOUS | Status: AC
  Filled 2023-07-16: qty 100

## 2023-07-16 MED ORDER — PHENYLEPHRINE HCL (PRESSORS) 10 MG/ML IV SOLN
INTRAVENOUS | Status: DC | PRN
  Administered 2023-07-16 (×3): 80 ug via INTRAVENOUS

## 2023-07-16 MED ORDER — OXYCODONE HCL 5 MG PO TABS: 5.0000 mg | ORAL_TABLET | Freq: Once | ORAL | Status: DC | PRN

## 2023-07-16 MED ORDER — DEXMEDETOMIDINE HCL IN NACL 80 MCG/20ML IV SOLN
INTRAVENOUS | Status: AC
  Filled 2023-07-16: qty 20

## 2023-07-16 MED ORDER — BUPIVACAINE-EPINEPHRINE (PF) 0.5% -1:200000 IJ SOLN
INTRAMUSCULAR | Status: DC | PRN
  Administered 2023-07-16: 30 mL via PERINEURAL

## 2023-07-16 MED ORDER — FENTANYL CITRATE (PF) 100 MCG/2ML IJ SOLN: 25.0000 ug | INTRAMUSCULAR | Status: DC | PRN

## 2023-07-16 MED ORDER — SODIUM CHLORIDE 0.9 % IV SOLN: INTRAVENOUS | Status: DC | PRN

## 2023-07-16 MED ORDER — OXYCODONE HCL 5 MG/5ML PO SOLN: 5.0000 mg | Freq: Once | ORAL | Status: DC | PRN

## 2023-07-16 MED ORDER — ONDANSETRON HCL 4 MG/2ML IJ SOLN
INTRAMUSCULAR | Status: AC
  Filled 2023-07-16: qty 2

## 2023-07-16 MED ORDER — PHENYLEPHRINE 80 MCG/ML (10ML) SYRINGE FOR IV PUSH (FOR BLOOD PRESSURE SUPPORT)
PREFILLED_SYRINGE | INTRAVENOUS | Status: AC
  Filled 2023-07-16: qty 10

## 2023-07-16 MED ORDER — ONDANSETRON HCL 4 MG/2ML IJ SOLN
INTRAMUSCULAR | Status: DC | PRN
  Administered 2023-07-16: 4 mg via INTRAVENOUS

## 2023-07-16 MED ORDER — LIDOCAINE HCL (CARDIAC) PF 100 MG/5ML IV SOSY
PREFILLED_SYRINGE | INTRAVENOUS | Status: DC | PRN
  Administered 2023-07-16: 50 mg via INTRAVENOUS

## 2023-07-16 MED ORDER — PROPOFOL 500 MG/50ML IV EMUL
INTRAVENOUS | Status: DC | PRN
  Administered 2023-07-16: 125 ug/kg/min via INTRAVENOUS

## 2023-07-16 MED ORDER — ACETAMINOPHEN 10 MG/ML IV SOLN: 1000.0000 mg | Freq: Once | INTRAVENOUS | Status: DC

## 2023-07-16 MED ORDER — FENTANYL CITRATE (PF) 100 MCG/2ML IJ SOLN
50.0000 ug | Freq: Once | INTRAMUSCULAR | Status: AC
  Administered 2023-07-16: 50 ug via INTRAVENOUS

## 2023-07-16 MED ORDER — 0.9 % SODIUM CHLORIDE (POUR BTL) OPTIME
TOPICAL | Status: DC | PRN
  Administered 2023-07-16: 300 mL

## 2023-07-16 MED ORDER — CLONIDINE HCL (ANALGESIA) 100 MCG/ML EP SOLN
EPIDURAL | Status: DC | PRN
  Administered 2023-07-16: 100 ug

## 2023-07-16 MED ORDER — MIDAZOLAM HCL 2 MG/2ML IJ SOLN
2.0000 mg | Freq: Once | INTRAMUSCULAR | Status: AC
  Administered 2023-07-16: 2 mg via INTRAVENOUS

## 2023-07-16 MED ORDER — FENTANYL CITRATE (PF) 100 MCG/2ML IJ SOLN
INTRAMUSCULAR | Status: DC | PRN
  Administered 2023-07-16 (×2): 50 ug via INTRAVENOUS

## 2023-07-16 MED ORDER — CEFAZOLIN SODIUM-DEXTROSE 2-4 GM/100ML-% IV SOLN
2.0000 g | INTRAVENOUS | Status: AC
  Administered 2023-07-16: 2 g via INTRAVENOUS

## 2023-07-16 MED ORDER — LACTATED RINGERS IV SOLN
INTRAVENOUS | Status: DC
Start: 2023-07-16 — End: 2023-07-16

## 2023-07-16 SURGICAL SUPPLY — 49 items
BIT DRILL SOLID 2.0X40MM (BIT) IMPLANT
BIT DRILL SOLID 2.5X40MM (BIT) IMPLANT
BLADE SURG 15 STRL LF DISP TIS (BLADE) ×1 IMPLANT
BNDG ELASTIC 3INX 5YD STR LF (GAUZE/BANDAGES/DRESSINGS) ×1 IMPLANT
BNDG ESMARK 4X9 LF (GAUZE/BANDAGES/DRESSINGS) ×1 IMPLANT
BNDG GAUZE DERMACEA FLUFF 4 (GAUZE/BANDAGES/DRESSINGS) ×1 IMPLANT
BNDG PLASTER X FAST 3X3 WHT LF (CAST SUPPLIES) ×10 IMPLANT
CHLORAPREP W/TINT 26 (MISCELLANEOUS) ×1 IMPLANT
CORD BIPOLAR FORCEPS 12FT (ELECTRODE) ×1 IMPLANT
COVER BACK TABLE 60X90IN (DRAPES) ×1 IMPLANT
CUFF TOURN SGL QUICK 18X4 (TOURNIQUET CUFF) ×1 IMPLANT
CUFF TRNQT CYL 24X4X16.5-23 (TOURNIQUET CUFF) IMPLANT
DRAPE EXTREMITY T 121X128X90 (DISPOSABLE) ×1 IMPLANT
DRAPE OEC MINIVIEW 54X84 (DRAPES) ×1 IMPLANT
DRAPE SURG 17X23 STRL (DRAPES) ×1 IMPLANT
DRILL SOLID 2.0X40MM (BIT) ×1
DRILL SOLID 2.5X40MM (BIT) ×1
DRIVER QUICK CONNECT T10 (MISCELLANEOUS) IMPLANT
GAUZE SPONGE 4X4 12PLY STRL (GAUZE/BANDAGES/DRESSINGS) ×1 IMPLANT
GAUZE XEROFORM 1X8 LF (GAUZE/BANDAGES/DRESSINGS) IMPLANT
GLOVE BIO SURGEON STRL SZ7 (GLOVE) ×1 IMPLANT
GOWN STRL REUS W/ TWL LRG LVL3 (GOWN DISPOSABLE) ×2 IMPLANT
GUIDE AIMING 1.5MM (WIRE) IMPLANT
NDL HYPO 25X1 1.5 SAFETY (NEEDLE) IMPLANT
NEEDLE HYPO 25X1 1.5 SAFETY (NEEDLE)
NS IRRIG 1000ML POUR BTL (IV SOLUTION) ×1 IMPLANT
PACK BASIN DAY SURGERY FS (CUSTOM PROCEDURE TRAY) ×1 IMPLANT
PAD CAST 3X4 CTTN HI CHSV (CAST SUPPLIES) ×1 IMPLANT
PEG GEMINUS SMOOTH LOCK 2.0X19 (Peg) IMPLANT
PEG NON LOCK THREAD 2.7X24MM (Screw) IMPLANT
PEG SMOOTH LOCK 2.0X18 (Screw) IMPLANT
PLATE STD 3H LEFT (Plate) IMPLANT
SCREW CORT LOCK 3.5X12 TI (Screw) IMPLANT
SCREW GEMINUS PANL 3.5X13 (Screw) IMPLANT
SCREW POLY NON LOCK 3.5MMX12MM (Screw) IMPLANT
SCREWDRIVER SURG ST 2 (INSTRUMENTS) IMPLANT
SHEET MEDIUM DRAPE 40X70 STRL (DRAPES) ×1 IMPLANT
SLEEVE SCD COMPRESS KNEE MED (STOCKING) IMPLANT
SPLINT FIBERGLASS 3X35 (CAST SUPPLIES) IMPLANT
SPLINT FIBERGLASS 4X30 (CAST SUPPLIES) IMPLANT
SUT ETHILON 4 0 PS 2 18 (SUTURE) ×1 IMPLANT
SUT MNCRL AB 3-0 PS2 18 (SUTURE) ×1 IMPLANT
SUT VIC AB 4-0 PS2 18 (SUTURE) IMPLANT
SYR BULB EAR ULCER 3OZ GRN STR (SYRINGE) ×1 IMPLANT
SYR CONTROL 10ML LL (SYRINGE) IMPLANT
TOWEL GREEN STERILE FF (TOWEL DISPOSABLE) ×2 IMPLANT
UNDERPAD 30X36 HEAVY ABSORB (UNDERPADS AND DIAPERS) ×1 IMPLANT
WIRE FIX 1.5 STANDARD TIP (WIRE) ×2
WIRE FIX 1.5 STD TIP (WIRE) IMPLANT

## 2023-07-16 NOTE — Interval H&P Note (Signed)
 History and Physical Interval Note:  07/16/2023 12:09 PM  Tracy Flores  has presented today for surgery, with the diagnosis of Left distal radius fracture.  The various methods of treatment have been discussed with the patient and family. After consideration of risks, benefits and other options for treatment, the patient has consented to  Procedure(s) with comments: OPEN REDUCTION INTERNAL FIXATION (ORIF) DISTAL RADIUS FRACTURE (Left) - regional 120 as a surgical intervention.  The patient's history has been reviewed, patient examined, no change in status, stable for surgery.  I have reviewed the patient's chart and labs.  Questions were answered to the patient's satisfaction.     Mubashir Mallek

## 2023-07-16 NOTE — Transfer of Care (Signed)
 Immediate Anesthesia Transfer of Care Note  Patient: Tracy Flores  Procedure(s) Performed: OPEN REDUCTION INTERNAL FIXATION (ORIF) DISTAL RADIUS FRACTURE (Left: Wrist)  Patient Location: PACU  Anesthesia Type:MAC and Regional  Level of Consciousness: awake, alert , and oriented  Airway & Oxygen Therapy: Patient Spontanous Breathing and Patient connected to nasal cannula oxygen  Post-op Assessment: Report given to RN and Post -op Vital signs reviewed and stable  Post vital signs: Reviewed and stable  Last Vitals:  Vitals Value Taken Time  BP    Temp    Pulse 70 07/16/23 1419  Resp 19 07/16/23 1419  SpO2 98 % 07/16/23 1419  Vitals shown include unfiled device data.  Last Pain:  Vitals:   07/16/23 1142  TempSrc: Temporal  PainSc: 0-No pain         Complications: No notable events documented.

## 2023-07-16 NOTE — Anesthesia Procedure Notes (Signed)
 Anesthesia Regional Block: Supraclavicular block   Pre-Anesthetic Checklist: , timeout performed,  Correct Patient, Correct Site, Correct Laterality,  Correct Procedure, Correct Position, site marked,  Risks and benefits discussed,  Pre-op evaluation,  At surgeon's request and post-op pain management  Laterality: Left  Prep: Maximum Sterile Barrier Precautions used, chloraprep       Needles:  Injection technique: Single-shot  Needle Type: Echogenic Stimulator Needle     Needle Length: 9cm  Needle Gauge: 22     Additional Needles:   Procedures:,,,, ultrasound used (permanent image in chart),,    Narrative:  Start time: 07/16/2023 12:08 PM End time: 07/16/2023 12:11 PM Injection made incrementally with aspirations every 5 mL.  Performed by: Personally  Anesthesiologist: Vernadine Golas, MD  Additional Notes: Risks, benefits, and alternative discussed. Patient gave consent for procedure. Patient prepped and draped in sterile fashion. Sedation administered, patient remains easily responsive to voice. Relevant anatomy identified with ultrasound guidance. Local anesthetic given in 5cc increments with no signs or symptoms of intravascular injection. No pain or paraesthesias with injection. Patient monitored throughout procedure with signs of LAST or immediate complications. Tolerated well. Ultrasound image placed in chart.  Amador Junes, MD

## 2023-07-16 NOTE — H&P (Signed)
 HAND SURGERY   HPI: Patient is a 33 y.o. female who presents with a closed, left, intra-articular distal radius fracture after ground-level fall.  She was seen in the office where we reviewed the nature of her injury and discussed both conservative and surgical treatment options.  Patient presents today for open reduction and internal fixation of the left distal radius.  Patient denies any changes to their medical history or new systemic symptoms today.    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   IBS (irritable bowel syndrome)    Interstitial cystitis    Major depression    OCD (obsessive compulsive disorder)    Pregnancy    Reactive airway disease    Reflux    Restless legs    URI (upper respiratory infection)    Vaginal Pap smear, abnormal    Past Surgical History:  Procedure Laterality Date   CERVICAL CONIZATION W/BX N/A 09/21/2019   Procedure: COLD KNIFE CONIZATION CERVIX WITH BIOPSY;  Surgeon: Albino Hum, MD;  Location: [facility];  Service: Gynecology;  Laterality: N/A;   NO PAST SURGERIES     Social History   Socioeconomic History   Marital status: Single    Spouse name: Not on file   Number of children: 1   Years of education: in college   Highest education level: Not on file  Occupational History   Occupation: Dentist: UNEMPLOYED  Tobacco Use   Smoking status: Every Day    Current packs/day: 0.50    Average packs/day: 0.5 packs/day for 5.0 years (2.5 ttl pk-yrs)    Types: Cigarettes   Smokeless tobacco: Never  Vaping Use   Vaping status: Never Used  Substance and Sexual Activity   Alcohol use: No   Drug use: No   Sexual activity: Not Currently    Birth control/protection: Condom  Other Topics Concern   Not on file  Social History Narrative   Not on file   Social Drivers of Health   Financial Resource  Strain: Not on file  Food Insecurity: Not on file  Transportation Needs: Not on file  Physical Activity: Not on file  Stress: Not on file  Social Connections: Not on file   Family History  Problem Relation Age of Onset   Cancer Paternal Grandmother    Hypertension Paternal Grandmother    Diabetes Paternal Grandmother    Arthritis Paternal Grandmother    Heart disease Paternal Grandmother    Depression Mother    Bipolar disorder Sister    Drug abuse Paternal Aunt    - negative except otherwise stated in the family history section Allergies  Allergen Reactions   Abilify [Aripiprazole]     Restless legs   Celexa [Citalopram Hydrobromide]     Agitation    Clavulanic Acid    Clindamycin/Lincomycin    Zoloft [Sertraline Hcl]     Suicidal thoughts   Amoxil [Amoxicillin] Rash   Penicillins Rash and Other (See Comments)    Unknown Childhood reaction   Prior to Admission medications   Medication Sig Start Date End Date Taking? Authorizing Provider  lisdexamfetamine (VYVANSE ) 40 MG capsule Take 1 capsule (40 mg total) by mouth every morning. 03/24/23  Yes Luking, Scott A, MD  lisdexamfetamine (VYVANSE ) 40 MG capsule TAKE ONE CAPSULE (40MG  TOTAL) BY MOUTH EVERY MORNING  03/24/23   Bennet Brasil, MD  lisdexamfetamine (VYVANSE ) 40 MG capsule Take 1 capsule (40 mg total) by mouth every morning. 06/30/23   Bennet Brasil, MD   No results found. - Positive ROS: All other systems have been reviewed and were otherwise negative with the exception of those mentioned in the HPI and as above.  Physical Exam: General: No acute distress, resting comfortably Cardiovascular: BUE warm and well perfused, normal rate Respiratory: Normal WOB on RA Skin: Warm and dry Neurologic: Sensation intact distally Psychiatric: Patient is at baseline mood and affect  Left upper Extremity  Her sugartong splint is clean and dry.  She has full active range of motion of all fingers.  Sensation is intact to  light touch in the median, ulnar, and radial nerve distributions.  Her hand is warm and well-perfused with brisk capillary refill.   Assessment: 42-year-old female with a closed, left, intra-articular distal radius fracture after a ground-level fall.  Plan: OR today for open reduction and internal excision of a left distal radius. We again reviewed the risks of surgery which include bleeding, infection, damage to neurovascular structures, persistent symptoms, nonunion, malunion, symptomatic hardware, complex regional pain syndrome, wrist or finger stiffness, need for additional surgery.  Informed consent was signed.  All questions were answered.   Marilyn Shropshire, M.D. [facility] 12:08 PM

## 2023-07-16 NOTE — Discharge Instructions (Addendum)
 Post Anesthesia Home Care Instructions  Activity: Get plenty of rest for the remainder of the day. A responsible individual must stay with you for 24 hours following the procedure.  For the next 24 hours, DO NOT: -Drive a car -Advertising copywriter -Drink alcoholic beverages -Take any medication unless instructed by your physician -Make any legal decisions or sign important papers.  Meals: Start with liquid foods such as gelatin or soup. Progress to regular foods as tolerated. Avoid greasy, spicy, heavy foods. If nausea and/or vomiting occur, drink only clear liquids until the nausea and/or vomiting subsides. Call your physician if vomiting continues.  Special Instructions/Symptoms: Your throat may feel dry or sore from the anesthesia or the breathing tube placed in your throat during surgery. If this causes discomfort, gargle with warm salt water. The discomfort should disappear within 24 hours.  If you had a scopolamine patch placed behind your ear for the management of post- operative nausea and/or vomiting:  1. The medication in the patch is effective for 72 hours, after which it should be removed.  Wrap patch in a tissue and discard in the trash. Wash hands thoroughly with soap and water. 2. You may remove the patch earlier than 72 hours if you experience unpleasant side effects which may include dry mouth, dizziness or visual disturbances. 3. Avoid touching the patch. Wash your hands with soap and water after contact with the patch.     Regional Anesthesia Blocks  1. You may not be able to move or feel the "blocked" extremity after a regional anesthetic block. This may last may last from 3-48 hours after placement, but it will go away. The length of time depends on the medication injected and your individual response to the medication. As the nerves start to wake up, you may experience tingling as the movement and feeling returns to your extremity. If the numbness and inability to move  your extremity has not gone away after 48 hours, please call your surgeon.   2. The extremity that is blocked will need to be protected until the numbness is gone and the strength has returned. Because you cannot feel it, you will need to take extra care to avoid injury. Because it may be weak, you may have difficulty moving it or using it. You may not know what position it is in without looking at it while the block is in effect.  3. For blocks in the legs and feet, returning to weight bearing and walking needs to be done carefully. You will need to wait until the numbness is entirely gone and the strength has returned. You should be able to move your leg and foot normally before you try and bear weight or walk. You will need someone to be with you when you first try to ensure you do not fall and possibly risk injury.  4. Bruising and tenderness at the needle site are common side effects and will resolve in a few days.  5. Persistent numbness or new problems with movement should be communicated to the surgeon or the [facility] [phone] [facility] [phone]).   Next dose of Tylenol  may be given at 6:30pm if needed.     Auburn Blaze, M.D. Hand Surgery  POST-OPERATIVE DISCHARGE INSTRUCTIONS   PRESCRIPTIONS: You may have been given a prescription to be taken as directed for post-operative pain control.  You may also take over the counter ibuprofen /aleve  and tylenol  for pain. Take this as directed on the packaging.  Do not exceed 3000 mg tylenol /acetaminophen  in 24 hours.  Ibuprofen  600-800 mg (3-4) tablets by mouth every 6 hours as needed for pain.  OR Aleve  2 tablets by mouth every 12 hours (twice daily) as needed for pain.  AND/OR Tylenol  1000 mg (2 tablets) every 8 hours as needed for pain.  Please use your pain medication carefully, as refills are limited and you may not be provided with one.  As stated above, please use over the counter pain  medicine - it will also be helpful with decreasing your swelling.    ANESTHESIA: After your surgery, post-surgical discomfort or pain is likely. This discomfort can last several days to a few weeks. At certain times of the day your discomfort may be more intense.   Did you receive a nerve block?  A nerve block can provide pain relief for one hour to two days after your surgery. As long as the nerve block is working, you will experience little or no sensation in the area the surgeon operated on.  As the nerve block wears off, you will begin to experience pain or discomfort. It is very important that you begin taking your prescribed pain medication before the nerve block fully wears off. Treating your pain at the first sign of the block wearing off will ensure your pain is better controlled and more tolerable when full-sensation returns. Do not wait until the pain is intolerable, as the medicine will be less effective. It is better to treat pain in advance than to try and catch up.   General Anesthesia:  If you did not receive a nerve block during your surgery, you will need to start taking your pain medication shortly after your surgery and should continue to do so as prescribed by your surgeon.     ICE AND ELEVATION: You may use ice for the first 48-72 hours, but it is not critical.   Motion of your fingers is very important to decrease the swelling.  Elevation, as much as possible for the next 48 hours, is critical for decreasing swelling as well as for pain relief. Elevation means when you are seated or lying down, you hand should be at or above your heart. When walking, the hand needs to be at or above the level of your elbow.  If the bandage gets too tight, it may need to be loosened. Please contact our office and we will instruct you in how to do this.    SURGICAL BANDAGES:  Keep your dressing and/or splint clean and dry at all times.  Do not remove until you are seen again in the office.   If careful, you may place a plastic bag over your bandage and tape the end to shower, but be careful, do not get your bandages wet.     HAND THERAPY:  You will be contacted regarding your first therapy visit.    ACTIVITY AND WORK: You are encouraged to move any fingers which are not in the bandage.  Light use of the fingers is allowed to assist the other hand with daily hygiene and eating, but strong gripping or lifting is often uncomfortable and should be avoided.  You might miss a variable period of time from work and hopefully this issue has been discussed prior to surgery. You may not do any heavy work with your affected hand for about 2 weeks.    [facility] Second Floor, [address] [place], [place] [address] [phone]

## 2023-07-16 NOTE — Anesthesia Preprocedure Evaluation (Addendum)
Anesthesia Evaluation  Patient identified by MRN, date of birth, ID band Patient awake    Reviewed: Allergy & Precautions, NPO status , Patient's Chart, lab work & pertinent test results  History of Anesthesia Complications Negative for: history of anesthetic complications  Airway Mallampati: II  TM Distance: >3 FB Neck ROM: Full    Dental no notable dental hx.    Pulmonary Current Smoker   Pulmonary exam normal        Cardiovascular negative cardio ROS Normal cardiovascular exam     Neuro/Psych  Headaches  Anxiety Depression       GI/Hepatic Neg liver ROS,GERD  ,,  Endo/Other  negative endocrine ROS    Renal/GU negative Renal ROS  negative genitourinary   Musculoskeletal Left distal radius fracture   Abdominal   Peds  Hematology negative hematology ROS (+)   Anesthesia Other Findings Day of surgery medications reviewed with patient.  Reproductive/Obstetrics negative OB ROS                             Anesthesia Physical Anesthesia Plan  ASA: 2  Anesthesia Plan: MAC and Regional   Post-op Pain Management: Tylenol PO (pre-op)*   Induction:   PONV Risk Score and Plan: 2 and Treatment may vary due to age or medical condition, Ondansetron, Dexamethasone and Midazolam  Airway Management Planned: Natural Airway and Simple Face Mask  Additional Equipment:   Intra-op Plan:   Post-operative Plan:   Informed Consent: I have reviewed the patients History and Physical, chart, labs and discussed the procedure including the risks, benefits and alternatives for the proposed anesthesia with the patient or authorized representative who has indicated his/her understanding and acceptance.     Dental advisory given  Plan Discussed with: CRNA  Anesthesia Plan Comments:        Anesthesia Quick Evaluation

## 2023-07-16 NOTE — Progress Notes (Signed)
Assisted Dr. Daiva Huge with left, supraclavicular, ultrasound guided block. Side rails up, monitors on throughout procedure. See vital signs in flow sheet. Tolerated Procedure well. ?

## 2023-07-16 NOTE — Op Note (Signed)
 Date of Surgery: 07/16/2023  INDICATIONS: Patient is a 33 y.o.-year-old female with an intra-articular left distal radius fracture after a ground level fall.  Risks, benefits, and alternatives to surgery were again discussed with the patient in the preoperative area. The patient wishes to proceed with surgery.  Informed consent was signed after our discussion.   PREOPERATIVE DIAGNOSIS:  Closed, left, intra-articular distal radius fracture, 3+ fragments Left ulnar styloid fracture  POSTOPERATIVE DIAGNOSIS: Same.  PROCEDURE:  Open reduction and internal fixation of left, intra-articular distal radius fracture, 3+ fragments Closed treatment of ulnar styloid fracture Brachioradialis tenotomy   SURGEON: Auburn Blaze, M.D.  ASSIST:   ANESTHESIA:  Regional, MAC  IV FLUIDS AND URINE: See anesthesia.  ESTIMATED BLOOD LOSS: 10 mL.  IMPLANTS:  Implant Name Type Inv. Item Serial No. Manufacturer Lot No. LRB No. Used Action  PLATE STD 3H LEFT - [id number] Plate PLATE STD 3H LEFT  SKELETAL DYNAMICS ON STERILE SET Left 1 Implanted  SCREW POLY NON LOCK 3.0HKV42VZ - [id number] Screw SCREW POLY NON LOCK 3.2RJJ88CZ  SKELETAL DYNAMICS ON STERILE SET Left 1 Implanted  SCREW GEMINUS PANL 3.5X13 - [id number] Screw SCREW GEMINUS PANL 3.5X13  SKELETAL DYNAMICS ON STERILE SET Left 1 Implanted  PEG SMOOTH LOCK 2.0X18 - [id number] Screw PEG SMOOTH LOCK 2.0X18  SKELETAL DYNAMICS ON STERILE SET Left 6 Implanted  PEG GEMINUS SMOOTH LOCK 2.0X19 - [id number] Peg PEG GEMINUS SMOOTH LOCK 2.0X19  SKELETAL DYNAMICS ON STERILE SET Left 3 Implanted  SCREW CORT LOCK 3.5X12 TI - [id number] Screw SCREW CORT LOCK 3.5X12 TI  SKELETAL DYNAMICS ON STERILE SET Left 1 Implanted  PEG NON LOCK THREAD 2.7X24MM - [id number] Screw PEG NON LOCK THREAD 2.7X24MM  SKELETAL DYNAMICS ON STERILE SET Left 2 Implanted     DRAINS: None  COMPLICATIONS: None  DESCRIPTION OF PROCEDURE:  The patient was met in the preoperative  holding area where the surgical site was marked and the consent form was signed. The patient was brought to the operating room and remained on the stretcher.  A hand table was placed adjacent to the operative extremity and locked into place.  All bony prominences were well-padded.  Monitored anesthesia was induced.  A tourniquet was placed high on the left upper arm.  The left upper extremity was then prepped and draped in the usual and sterile fashion.  A formal timeout was performed to confirm that this is the correct patient, surgical side, surgical site, and surgical procedure.  All necessary implants were in the room.  Following formal timeout, a longitudinal incision was made directly overlying the flexor carpi radialis tendon.  The skin was incised.  Small crossing vessels were coagulated with bipolar cautery.  The volar aspect of the flexor carpi radialis tendon sheath was incised longitudinally.  The FCR tendon was retracted radially.  The floor of the FCR tendon sheath was similarly divided using a 15 blade scalpel.  Blunt dissection was used to develop Parona space between the flexor pollicis longus and pronator quadratus.  The flexor pollicis longus was retracted ulnarly.  An L-shaped tenotomy was performed along the distal and radial aspect of the pronator quadratus tendon.  The pronator quadratus was subperiosteally dissected off of the volar aspect of the radius and the distal fracture fragments using an elevator.  Blunt dissection was used to identify the brachioradialis tendon as it inserted on the radial aspect of the distal radius.  The underlying first dorsal compartment tendons were identified and protected.  A tenotomy of  the brachioradialis was performed to aid in reduction of the fracture.  The fracture site was identified and debrided of all interposed soft tissue using a freer elevator and small curette.  The fracture was reduced with a combination of longitudinal traction, flexion of  the wrist, and gentle manipulation.  A 3 hole standard plate was selected.  It was placed on the volar surface of the radius.  With the plate in appropriate position, the oblong hole was drilled and a 3.5 millimeter screw placed.  This would allow for fine-tuning of the proximal to distal plate placement.  The fracture was held in a reduced position and 2 K wires were placed in the aiming guides in the ulnar and radial heads of the plate.  An AP view showed that the fracture was adequately reduced with restoration of radial height.  The lateral view showed restoration of volar tilt.  It also demonstrated that the K wires were in a good, subchondral location.  At this point, the distal holes in the plate were drilled and then filled with smooth locking pegs taking care that the pegs did not penetrate the dorsal cortex.  The peg in the radial styloid was placed under fluoroscopic visualization.  At this point, the remaining 2 holes in the plate were drilled and filled with 3.5 mm nonlocking screws with excellent purchase.  An AP view showed that the fracture was adequately reduced with restoration of radial height and inclination.  The plate was in appropriate proximal to distal position.  The DRUJ appeared well reduced.  The lateral view showed that there was restoration of the volar tilt.  The articular surface was congruent.  All distal locking screws were in a good, subchondral location and did not appear to penetrate the dorsal cortex.  The 3 screws in the shaft of the plate were of appropriate length.  At this point, I examined the DRUJ.  The DRUJ was stable in pronation, neutral, and supination.  The wound was then thoroughly irrigated with copious sterile saline.  I was unable to repair the pronator quadratus was of fairly poor quality from the traumatic injury.  I then placed several 3-0 Monocryl sutures in a buried interrupted fashion.  The tourniquet was deflated.  Hemostasis was achieved with direct  pressure over the wound.  The skin was then closed using a 4-0 nylon suture in horizontal mattress fashion.  The wound was then dressed with Xeroform, folded Kerlix, cast padding, and a well-padded volar splint was applied.  The patient was reversed from sedation.  All counts were correct x 2 at the end of the procedure.  The patient was then taken to the PACU in stable condition.   POSTOPERATIVE PLAN: She will be discharged to home with appropriate pain medication and discharge instructions.  A referral will be placed to hand therapy.  I will see her back in 10 to 14 days for her first postop visit.  Auburn Blaze, MD 2:25 PM

## 2023-07-17 NOTE — Anesthesia Postprocedure Evaluation (Signed)
Anesthesia Post Note  Patient: Tracy Flores  Procedure(s) Performed: OPEN REDUCTION INTERNAL FIXATION (ORIF) DISTAL RADIUS FRACTURE (Left: Wrist)     Patient location during evaluation: PACU Anesthesia Type: Regional Level of consciousness: awake and alert Pain management: pain level controlled Vital Signs Assessment: post-procedure vital signs reviewed and stable Respiratory status: spontaneous breathing, nonlabored ventilation and respiratory function stable Cardiovascular status: blood pressure returned to baseline Postop Assessment: no apparent nausea or vomiting Anesthetic complications: no   No notable events documented.  Last Vitals:  Vitals:   07/16/23 1445 07/16/23 1502  BP: 113/60 (!) 104/51  Pulse: 67 69  Resp: 17 16  Temp:  36.5 C  SpO2: 95% 95%    Last Pain:  Vitals:   07/16/23 1502  TempSrc: Temporal  PainSc: 0-No pain                 Shanda Howells

## 2023-07-24 ENCOUNTER — Ambulatory Visit: Payer: Medicaid Other | Admitting: Family Medicine

## 2023-07-24 ENCOUNTER — Encounter (HOSPITAL_BASED_OUTPATIENT_CLINIC_OR_DEPARTMENT_OTHER): Payer: Self-pay | Admitting: Orthopedic Surgery

## 2023-07-24 VITALS — BP 121/83 | HR 95 | Temp 97.5°F | Ht 68.0 in | Wt 206.0 lb

## 2023-07-24 DIAGNOSIS — Z8781 Personal history of (healed) traumatic fracture: Secondary | ICD-10-CM | POA: Diagnosis not present

## 2023-07-24 DIAGNOSIS — E559 Vitamin D deficiency, unspecified: Secondary | ICD-10-CM

## 2023-07-24 DIAGNOSIS — F988 Other specified behavioral and emotional disorders with onset usually occurring in childhood and adolescence: Secondary | ICD-10-CM | POA: Diagnosis not present

## 2023-07-24 MED ORDER — LISDEXAMFETAMINE DIMESYLATE 50 MG PO CAPS: 50.0000 mg | ORAL_CAPSULE | ORAL | 0 refills | Status: DC

## 2023-07-24 MED ORDER — LISDEXAMFETAMINE DIMESYLATE 50 MG PO CAPS: ORAL_CAPSULE | ORAL | 0 refills | Status: DC

## 2023-07-24 NOTE — Progress Notes (Signed)
   Subjective:    Patient ID: Tracy Flores, female    DOB: October 15, 1990, 33 y.o.   MRN: [id number]   History of Present Illness         This patient has adult ADD. Takes medication responsibly. Medication does help the patient focus in be more functional. Patient relates that they are or not abusing the medication or misusing the medication. The patient understands that if they're having any negative side effects such as elevated high blood pressure severe headaches they would need stop the medication follow-up immediately. They also understand that the prescriptions are to last for 3 months then the patient will need to follow-up before having further prescriptions.  Patient compliance She takes it on workdays  Does medication help patient function /attention better denies any problems with that states it does help her focus better but she feels like the medicine not working quite as well we did discuss a medication increase  Side effects denies any side effects  Recently had fracture from a fall she has had several fractures but each 1 of these were from significant injuries I doubt that she has a bone disorder or osteoporosis  .  Review of Systems     Objective:   Physical Exam  General-in no acute distress Eyes-no discharge Lungs-respiratory rate normal, CTA CV-no murmurs,RRR Extremities skin warm dry no edema Neuro grossly normal Behavior normal, alert   Do not feel bone density indicated currently    Assessment & Plan:   1. 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  Patient is stable on current regimen.  Appropriate prescriptions prescribed.  Medications were reviewed with the patient as well as compliance. Side effects were checked for. Discussion regarding effectiveness was held. Prescriptions were electronically sent in.  Patient reminded to follow-up in approximately 3 months.    Plans to [facility] law with drug registry was checked and verified while present with the patient. Patient will follow-up within 4 months  2. Frequent fractures of bone Check lab work await results - Basic Metabolic Panel - Vitamin D, 25-hydroxy - TSH + free T4 Told patient it would be a good idea for her to get adequate calcium in either through diet or a calcium supplement 3. Vitamin D deficiency Check lab work - Vitamin D, 25-hydroxy Recommend 400 IU vitamin D daily but will check lab work

## 2023-07-28 ENCOUNTER — Encounter: Payer: Self-pay | Admitting: Podiatry

## 2023-07-28 ENCOUNTER — Ambulatory Visit: Payer: Medicaid Other | Admitting: Podiatry

## 2023-07-28 DIAGNOSIS — M722 Plantar fascial fibromatosis: Secondary | ICD-10-CM | POA: Diagnosis not present

## 2023-07-28 NOTE — Progress Notes (Signed)
Subjective: Chief Complaint  Patient presents with   Foot Pain    RM#13 Follow up bilateral foot pain patient states is doing better no concerns at this time.Not experiencing any pain.   33 year old female presents the office today with above concerns.  She says she is doing well is not been any pain to her feet.  She is also been out of work however due to a left arm fracture.  She said that she is can use shoes for work and be ready for when she gets back to work.  No new injuries or concerns.  Objective: AAO x3, NAD DP/PT pulses palpable bilaterally, CRT less than 3 seconds The left able to appreciate any area pinpoint tenderness there is no edema, erythema.  On the right foot there is some slight discomfort of the plantar aspect the heel insertion of the plantar fascia.  There is no pain with lateral compression of the calcaneus.  No pain with Achilles tendon.  There is no other areas of discomfort identified at this time.  MMT 5/5. No pain with calf compression, swelling, warmth, erythema  Assessment: Right plantar fasciitis, resolved left foot pain  Plan: -All treatment options discussed with the patient including all alternatives, risks, complications.  -Overall doing much better.  We discussed stretching, icing on a regular basis as well as shoes, the arch support.  Gave her a coupon for [employer].  If needed we can do an insole inside the shoes by think changing shoes given better support will be more helpful long-term. -Anti-inflammatories as needed. -Patient encouraged to call the office with any questions, concerns, change in symptoms.   Vivi Barrack DPM

## 2023-07-29 DIAGNOSIS — S52502D Unspecified fracture of the lower end of left radius, subsequent encounter for closed fracture with routine healing: Secondary | ICD-10-CM | POA: Diagnosis not present

## 2023-07-29 DIAGNOSIS — M25642 Stiffness of left hand, not elsewhere classified: Secondary | ICD-10-CM | POA: Diagnosis not present

## 2023-08-05 DIAGNOSIS — M25642 Stiffness of left hand, not elsewhere classified: Secondary | ICD-10-CM | POA: Diagnosis not present

## 2023-08-05 DIAGNOSIS — Z8781 Personal history of (healed) traumatic fracture: Secondary | ICD-10-CM | POA: Diagnosis not present

## 2023-08-05 DIAGNOSIS — E559 Vitamin D deficiency, unspecified: Secondary | ICD-10-CM | POA: Diagnosis not present

## 2023-08-06 ENCOUNTER — Other Ambulatory Visit: Payer: Self-pay

## 2023-08-06 ENCOUNTER — Encounter: Payer: Self-pay | Admitting: Family Medicine

## 2023-08-06 LAB — TSH+FREE T4
Free T4: 0.94 ng/dL (ref 0.82–1.77)
TSH: 2.72 u[IU]/mL (ref 0.450–4.500)

## 2023-08-06 LAB — VITAMIN D 25 HYDROXY (VIT D DEFICIENCY, FRACTURES): Vit D, 25-Hydroxy: 16.2 ng/mL — ABNORMAL LOW (ref 30.0–100.0)

## 2023-08-06 LAB — BASIC METABOLIC PANEL
BUN/Creatinine Ratio: 15 (ref 9–23)
BUN: 11 mg/dL (ref 6–20)
CO2: 21 mmol/L (ref 20–29)
Calcium: 9.5 mg/dL (ref 8.7–10.2)
Chloride: 100 mmol/L (ref 96–106)
Creatinine, Ser: 0.73 mg/dL (ref 0.57–1.00)
Glucose: 92 mg/dL (ref 70–99)
Potassium: 4.3 mmol/L (ref 3.5–5.2)
Sodium: 139 mmol/L (ref 134–144)
eGFR: 111 mL/min/{1.73_m2} (ref >59)

## 2023-08-06 MED ORDER — VITAMIN D (ERGOCALCIFEROL) 1.25 MG (50000 UNIT) PO CAPS: 50000.0000 [IU] | ORAL_CAPSULE | ORAL | 1 refills | Status: DC

## 2023-08-14 DIAGNOSIS — M25642 Stiffness of left hand, not elsewhere classified: Secondary | ICD-10-CM | POA: Diagnosis not present

## 2023-08-19 DIAGNOSIS — M25642 Stiffness of left hand, not elsewhere classified: Secondary | ICD-10-CM | POA: Diagnosis not present

## 2023-08-19 DIAGNOSIS — S52502D Unspecified fracture of the lower end of left radius, subsequent encounter for closed fracture with routine healing: Secondary | ICD-10-CM | POA: Diagnosis not present

## 2023-08-26 DIAGNOSIS — M25642 Stiffness of left hand, not elsewhere classified: Secondary | ICD-10-CM | POA: Diagnosis not present

## 2023-09-02 DIAGNOSIS — M25642 Stiffness of left hand, not elsewhere classified: Secondary | ICD-10-CM | POA: Diagnosis not present

## 2023-09-09 DIAGNOSIS — M25642 Stiffness of left hand, not elsewhere classified: Secondary | ICD-10-CM | POA: Diagnosis not present

## 2023-09-16 DIAGNOSIS — S52502D Unspecified fracture of the lower end of left radius, subsequent encounter for closed fracture with routine healing: Secondary | ICD-10-CM | POA: Diagnosis not present

## 2023-09-16 DIAGNOSIS — M25642 Stiffness of left hand, not elsewhere classified: Secondary | ICD-10-CM | POA: Diagnosis not present

## 2023-09-25 DIAGNOSIS — M25642 Stiffness of left hand, not elsewhere classified: Secondary | ICD-10-CM | POA: Diagnosis not present

## 2023-10-02 DIAGNOSIS — M25642 Stiffness of left hand, not elsewhere classified: Secondary | ICD-10-CM | POA: Diagnosis not present

## 2023-10-08 DIAGNOSIS — M25642 Stiffness of left hand, not elsewhere classified: Secondary | ICD-10-CM | POA: Diagnosis not present

## 2023-10-10 DIAGNOSIS — S52502D Unspecified fracture of the lower end of left radius, subsequent encounter for closed fracture with routine healing: Secondary | ICD-10-CM | POA: Diagnosis not present

## 2023-11-06 ENCOUNTER — Other Ambulatory Visit: Payer: Self-pay | Admitting: Family Medicine

## 2023-11-07 DIAGNOSIS — S52502D Unspecified fracture of the lower end of left radius, subsequent encounter for closed fracture with routine healing: Secondary | ICD-10-CM | POA: Diagnosis not present

## 2023-12-08 ENCOUNTER — Other Ambulatory Visit: Payer: Self-pay | Admitting: Family Medicine

## 2023-12-24 ENCOUNTER — Ambulatory Visit: Payer: Medicaid Other | Admitting: Family Medicine

## 2023-12-24 ENCOUNTER — Encounter: Payer: Self-pay | Admitting: Family Medicine

## 2023-12-24 VITALS — BP 105/64 | HR 107 | Temp 98.2°F | Ht 68.0 in | Wt 207.0 lb

## 2023-12-24 DIAGNOSIS — F988 Other specified behavioral and emotional disorders with onset usually occurring in childhood and adolescence: Secondary | ICD-10-CM | POA: Diagnosis not present

## 2023-12-24 MED ORDER — LISDEXAMFETAMINE DIMESYLATE 50 MG PO CAPS: 50.0000 mg | ORAL_CAPSULE | Freq: Every day | ORAL | 0 refills | Status: DC

## 2023-12-24 MED ORDER — LISDEXAMFETAMINE DIMESYLATE 50 MG PO CAPS: 50.0000 mg | ORAL_CAPSULE | ORAL | 0 refills | Status: DC

## 2023-12-24 MED ORDER — LISDEXAMFETAMINE DIMESYLATE 50 MG PO CAPS: ORAL_CAPSULE | ORAL | 0 refills | Status: DC

## 2023-12-24 NOTE — Progress Notes (Signed)
   Subjective:    Patient ID: Mariel JONETTA Ill, female    DOB: August 01, 1990, 33 y.o.   MRN: [id number]  HPI This patient has adult ADD. Takes medication responsibly. Medication does help the patient focus in be more functional. Patient relates that they are or not abusing the medication or misusing the medication. The patient understands that if they're having any negative side effects such as elevated high blood pressure severe headaches they would need stop the medication follow-up immediately. They also understand that the prescriptions are to last for 3 months then the patient will need to follow-up before having further prescriptions.  Patient compliance daily  Does medication help patient function /attention better  yes  Side effects  none   Discussed the use of AI scribe software for clinical note transcription with the patient, who gave verbal consent to proceed.  History of Present Illness   Twanisha Foulk Reeb is a 33 year old female who presents for medication management and follow-up.  She takes her medication daily, typically between 5:30 and 6:30 AM. Attempts to skip doses on non-work days have been ineffective, indicating the medication's role in helping her stay on task and focused.  She is currently adequately staffed at work and does not find her work too challenging as long as she is taking her medication.  She is physically active, engaging in swimming, treadmill exercises, and sauna sessions. She swims an average of fifteen laps a day and maintains a healthy diet, eating salads three days a week.  Recent blood work included an A1c and a thyroid  test.      Review of Systems     Objective:   Physical Exam  General-in no acute distress Eyes-no discharge Lungs-respiratory rate normal, CTA CV-no murmurs,RRR Extremities skin warm dry no edema Neuro grossly normal Behavior normal, alert       Assessment & Plan:   Adult ADD Doing well on medicine Keeps it in a safe  place Will send in 3 scripts PDMP was checked To follow-up if any progressive troubles or setbacks Otherwise follow-up within 4 months When she needs her fourth prescription to let us  know

## 2024-04-09 ENCOUNTER — Other Ambulatory Visit: Payer: Self-pay | Admitting: Family Medicine

## 2024-04-23 ENCOUNTER — Ambulatory Visit: Admitting: Nurse Practitioner

## 2024-04-23 ENCOUNTER — Encounter: Payer: Self-pay | Admitting: Nurse Practitioner

## 2024-04-23 VITALS — BP 110/75 | HR 91 | Ht 68.0 in | Wt 186.0 lb

## 2024-04-23 DIAGNOSIS — F988 Other specified behavioral and emotional disorders with onset usually occurring in childhood and adolescence: Secondary | ICD-10-CM

## 2024-04-23 MED ORDER — LISDEXAMFETAMINE DIMESYLATE 50 MG PO CAPS: ORAL_CAPSULE | ORAL | 0 refills | Status: DC

## 2024-04-23 MED ORDER — LISDEXAMFETAMINE DIMESYLATE 50 MG PO CAPS: 50.0000 mg | ORAL_CAPSULE | Freq: Every day | ORAL | 0 refills | Status: DC

## 2024-04-23 NOTE — Progress Notes (Addendum)
 Subjective:    Patient ID: Tracy Flores Ill, female    DOB: 07/12/1990, 33 y.o.   MRN: [id number] CC: medication follow-up  HPI 33 year old female arrived for followup on her ADHD medication. She denies any side effects. She states that she takes the medication before work and that it will last about 7 hours. 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  Flu shot deferred.     Review of Systems  Constitutional:  Negative for activity change and fever.  HENT:  Negative for sore throat and trouble swallowing.   Respiratory:  Negative for cough, shortness of breath and wheezing.   Cardiovascular:  Negative for chest pain.  Gastrointestinal:  Negative for abdominal pain, constipation, diarrhea, nausea and vomiting.  Psychiatric/Behavioral:  Negative for decreased concentration and sleep disturbance.       12/24/2023    2:57 PM  Depression screen PHQ 2/9  Decreased Interest 2  Down, Depressed, Hopeless 1  PHQ - 2 Score 3  Altered sleeping 1  Tired, decreased energy 3  Change in appetite 1  Feeling bad or failure about yourself  0  Trouble concentrating 2  Moving slowly or fidgety/restless 1  Suicidal thoughts 0  PHQ-9 Score 11  Difficult doing work/chores Very difficult      12/24/2023    2:58 PM 07/24/2023    3:06 PM 11/20/2022   10:33 AM 07/23/2022   10:10 AM  GAD 7 : Generalized Anxiety Score  Nervous, Anxious, on Edge 1 1 2  0  Control/stop worrying 1 1 1  0  Worry too much - different things 1 1 1  0  Trouble relaxing 3 2 3 1   Restless 1 1 2 1   Easily annoyed or irritable 3 2 2 2   Afraid - awful might happen 0 0 0 0  Total GAD 7 Score 10 8 11 4   Anxiety Difficulty Very difficult Somewhat difficult Somewhat difficult Somewhat difficult   Social History   Tobacco Use   Smoking  status: Every Day    Current packs/day: 0.50    Average packs/day: 0.5 packs/day for 5.0 years (2.5 ttl pk-yrs)    Types: Cigarettes   Smokeless tobacco: Never  Vaping Use   Vaping status: Never Used  Substance Use Topics   Alcohol use: No   Drug use: No        Objective:   Physical Exam Vitals and nursing note reviewed.  Constitutional:      General: She is not in acute distress.    Appearance: Normal appearance.  Cardiovascular:     Rate and Rhythm: Normal rate and regular rhythm.     Heart sounds: Normal heart sounds.  Pulmonary:     Effort: Pulmonary effort is normal.     Breath sounds: Normal breath sounds.  Skin:    General: Skin is warm and dry.  Neurological:     Mental Status: She is alert and oriented to person, place, and time.  Psychiatric:        Mood and Affect: Mood normal.        Behavior: Behavior normal.        Thought Content: Thought content normal.    Vitals:   04/23/24 1425  BP: 110/75  Pulse: 91  Height: 5' 8 (1.727  m)  Weight: 84.4 kg  BMI (Calculated): 28.29          Assessment & Plan:  1. 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  Contact the office with any new symptoms or side effects.  Patient educated on discontinuing this medication if she becomes pregnant. Has no plans for pregnancy at this time.    - lisdexamfetamine (VYVANSE ) 50 MG capsule; Take 1 capsule (50 mg total) by mouth daily.  Dispense: 30 capsule; Refill: 0 - lisdexamfetamine (VYVANSE ) 50 MG capsule; TAKE ONE CAPSULE (50MG  TOTAL) BY MOUTH DAILY  Dispense: 30 capsule; Refill: 0 - lisdexamfetamine (VYVANSE ) 50 MG capsule; TAKE ONE CAPSULE (40MG  TOTAL) BY MOUTH EVERY MORNING  Dispense: 30 capsule; Refill: 0   Return in about 3 months (around 07/24/2024).   I have seen and examined this patient alongside the NP student. I have reviewed and verified the student note and agree with the assessment and  plan.  Elveria Quarry, FNP

## 2024-04-26 ENCOUNTER — Encounter: Payer: Self-pay | Admitting: Nurse Practitioner

## 2024-05-12 ENCOUNTER — Ambulatory Visit
Admission: EM | Admit: 2024-05-12 | Discharge: 2024-05-12 | Disposition: A | Discharge status: Home / Self Care | Attending: Nurse Practitioner | Admitting: Nurse Practitioner

## 2024-05-12 ENCOUNTER — Other Ambulatory Visit: Payer: Self-pay

## 2024-05-12 ENCOUNTER — Encounter: Payer: Self-pay | Admitting: Emergency Medicine

## 2024-05-12 DIAGNOSIS — J069 Acute upper respiratory infection, unspecified: Secondary | ICD-10-CM | POA: Diagnosis not present

## 2024-05-12 MED ORDER — BENZONATATE 100 MG PO CAPS: 100.0000 mg | ORAL_CAPSULE | Freq: Three times a day (TID) | ORAL | 0 refills | Status: DC | PRN

## 2024-05-12 NOTE — ED Triage Notes (Signed)
 Pt reports productive cough, nasal congestion, body aches x12 days. Denies any known fevers. Pt reports has tried otc robitussin with decongestant that had provided some relief of symptoms.

## 2024-05-12 NOTE — ED Provider Notes (Signed)
 [facility]    CSN: [id number] Arrival date & time: 05/12/24  1108      History   Chief Complaint Chief Complaint  Patient presents with   Cough    HPI Tracy Flores is a 33 y.o. female.   Patient presents today with 12-day history of congested cough, shortness of breath with coughing fits, posterior rib cage pain when she coughs, stuffy nose worse in the morning and at night, headache in her temples, and fatigue.  She denies fever, body aches or chills, wheezing, chest pain or tightness, runny nose, sore throat, sinus pressure, ear pain, abdominal pain, nausea/vomiting, and diarrhea.  No significant change in appetite.  Has been taking Robitussin and decongestant with mild improvement in symptoms.  Reports he went to work today and they told her she looked terrible so she came to be evaluated.    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   Routine cervical smear 02/08/2021   Menorrhagia with regular cycle 02/08/2021   Pregnancy examination or test, negative result 02/08/2021   Anxiety and depression 01/21/2020   Migraine, chronic, without aura 12/23/2019   Moderate dysplasia of cervix (CIN II) 09/30/2019   Periodic limb movements of sleep 08/07/2019   Episodic tension-type headache, not intractable 04/30/2019   ADD (attention deficit disorder) 03/26/2013   Generalized anxiety disorder 11/13/2012   Genital herpes 11/13/2012     Past Surgical History:  Procedure Laterality Date   CERVICAL CONIZATION W/BX N/A 09/21/2019   Procedure: COLD KNIFE CONIZATION CERVIX WITH BIOPSY;  Surgeon: Edsel Norleen GAILS, MD;  Location: [facility];  Service: Gynecology;  Laterality: N/A;   NO PAST SURGERIES     OPEN REDUCTION INTERNAL FIXATION (ORIF) DISTAL RADIAL FRACTURE Left 07/16/2023   Procedure: OPEN REDUCTION INTERNAL FIXATION (ORIF) DISTAL RADIUS FRACTURE;  Surgeon: Romona Harari, MD;  Location: [facility];  Service: Orthopedics;  Laterality: Left;  regional 120    OB History     Gravida  1   Para  1   Term  1   Preterm      AB      Living  1      SAB      IAB      Ectopic      Multiple      Live Births  1            Home Medications    Prior to Admission medications   Medication Sig Start Date End Date Taking? Authorizing Provider  benzonatate  (TESSALON ) 100 MG capsule Take 1 capsule (100 mg total) by mouth 3 (three) times daily as needed for cough. Do not take with alcohol or while operating or driving heavy machinery 88/87/74  Yes Chandra Harlene LABOR, NP    Family History Family History  Problem Relation  Age of Onset   Cancer Paternal Grandmother    Hypertension Paternal Grandmother    Diabetes Paternal Grandmother    Arthritis Paternal Grandmother    Heart disease Paternal Grandmother    Depression Mother    Bipolar disorder Sister    Drug abuse Paternal Aunt     Social History Social History   Tobacco Use   Smoking status: Every Day    Current packs/day: 0.50    Average packs/day: 0.5 packs/day for 5.0 years (2.5 ttl pk-yrs)    Types: Cigarettes   Smokeless tobacco: Never  Vaping Use   Vaping status: Never Used  Substance Use Topics   Alcohol use: No   Drug use: No     Allergies   Abilify [aripiprazole], Celexa [citalopram hydrobromide], Clavulanic acid, Clindamycin/lincomycin, Zoloft [sertraline hcl], Amoxil [amoxicillin], and Penicillins   Review  of Systems Review of Systems Per HPI  Physical Exam Triage Vital Signs ED Triage Vitals  Encounter Vitals Group     BP 05/12/24 1122 117/75     Girls Systolic BP Percentile --      Girls Diastolic BP Percentile --      Boys Systolic BP Percentile --      Boys Diastolic BP Percentile --      Pulse Rate 05/12/24 1122 89     Resp 05/12/24 1122 20     Temp 05/12/24 1122 97.7 F (36.5 C)     Temp Source 05/12/24 1122 Oral     SpO2 05/12/24 1122 96 %     Weight --      Height --      Head Circumference --      Peak Flow --      Pain Score 05/12/24 1120 3     Pain Loc --      Pain Education --      Exclude from Growth Chart --    No data found.  Updated Vital Signs BP 117/75 (BP Location: Right Arm)   Pulse 89   Temp 97.7 F (36.5 C) (Oral)   Resp 20   LMP 04/16/2024 (Approximate)   SpO2 96%   Visual Acuity Right Eye Distance:   Left Eye Distance:   Bilateral Distance:    Right Eye Near:   Left Eye Near:    Bilateral Near:     Physical Exam Vitals and nursing note reviewed.  Constitutional:      General: She is not in acute distress.    Appearance: Normal appearance. She is not ill-appearing or toxic-appearing.  HENT:     Head: Normocephalic and atraumatic.     Right Ear: Tympanic membrane, ear canal and external ear normal.     Left Ear: Tympanic membrane, ear canal and external ear normal.     Nose: No congestion or rhinorrhea.     Mouth/Throat:     Mouth: Mucous membranes are moist.     Pharynx: Oropharynx is clear. No oropharyngeal exudate or posterior oropharyngeal erythema.  Eyes:     General: No scleral icterus.    Extraocular Movements: Extraocular movements intact.  Cardiovascular:     Rate and Rhythm: Normal rate and regular rhythm.  Pulmonary:     Effort: Pulmonary effort is normal. No respiratory distress.     Breath sounds: Normal breath sounds. No wheezing, rhonchi or rales.  Musculoskeletal:     Cervical back: Normal range of motion and  neck supple.  Lymphadenopathy:     Cervical: No cervical adenopathy.  Skin:  General: Skin is warm and dry.     Coloration: Skin is not jaundiced or pale.     Findings: No erythema or rash.  Neurological:     Mental Status: She is alert and oriented to person, place, and time.  Psychiatric:        Behavior: Behavior is cooperative.      UC Treatments / Results  Labs (all labs ordered are listed, but only abnormal results are displayed) Labs Reviewed - No data to display  EKG   Radiology No results found.  Procedures Procedures (including critical care time)  Medications Ordered in UC Medications - No data to display  Initial Impression / Assessment and Plan / UC Course  I have reviewed the triage vital signs and the nursing notes.  Pertinent labs & imaging results that were available during my care of the patient were reviewed by me and considered in my medical decision making (see chart for details).   Patient is well-appearing, normotensive, afebrile, not tachycardic, not tachypneic, oxygenating well on room air.   1. Viral URI with cough Suspect ongoing viral etiology Vitals are reassuring and lungs are clear to auscultation bilaterally; patient does not have significant sinus pressure Supportive care discussed with patient Continue Robitussin/guaifenesin, start cough suppressant medication ER and return precautions discussed Work excuse provided  The patient was given the opportunity to ask questions.  All questions answered to their satisfaction.  The patient is in agreement to this plan.   Final Clinical Impressions(s) / UC Diagnoses   Final diagnoses:  Viral URI with cough     Discharge Instructions      You have a viral upper respiratory infection.  Symptoms should improve over the next few days.  If you develop chest pain or shortness of breath, go to the emergency room.  Some things that can make you feel better are: - Increased rest -  Increasing fluid with water/sugar free electrolytes - Acetaminophen  and ibuprofen  as needed for fever/pain - Salt water gargling, chloraseptic spray and throat lozenges - OTC guaifenesin (Mucinex) 600 mg twice daily - Saline sinus flushes or a neti pot - Humidifying the air -Tessalon  Perles every 8 hours as needed for dry cough     ED Prescriptions     Medication Sig Dispense Auth. Provider   benzonatate  (TESSALON ) 100 MG capsule Take 1 capsule (100 mg total) by mouth 3 (three) times daily as needed for cough. Do not take with alcohol or while operating or driving heavy machinery 21 capsule Chandra Harlene LABOR, NP      PDMP not reviewed this encounter.   Chandra Harlene LABOR, NP 05/12/24 1231

## 2024-05-12 NOTE — Discharge Instructions (Signed)
 You have a viral upper respiratory infection.  Symptoms should improve over the next few days.  If you develop chest pain or shortness of breath, go to the emergency room.  Some things that can make you feel better are: - Increased rest - Increasing fluid with water/sugar free electrolytes - Acetaminophen  and ibuprofen  as needed for fever/pain - Salt water gargling, chloraseptic spray and throat lozenges - OTC guaifenesin (Mucinex) 600 mg twice daily - Saline sinus flushes or a neti pot - Humidifying the air -Tessalon  Perles every 8 hours as needed for dry cough

## 2024-05-14 DIAGNOSIS — R030 Elevated blood-pressure reading, without diagnosis of hypertension: Secondary | ICD-10-CM | POA: Diagnosis not present

## 2024-05-14 DIAGNOSIS — Z3201 Encounter for pregnancy test, result positive: Secondary | ICD-10-CM | POA: Diagnosis not present

## 2024-06-02 ENCOUNTER — Telehealth: Payer: Self-pay

## 2024-06-02 NOTE — Telephone Encounter (Signed)
 Patient called in regards to light spotting in early pregnancy. Rn inquired about recent heavy lifting, straining or sex. Patient denies any changes in her normal routine and sex. RN educated on how light pink spotting is normal during pregnancy but if it increases, darkens in color or she passes clots that would be when it is concerning. RN informed patient that if any changes occur she can always reach out to the office via phone or MyChart as well as MAU for after hours/weekends/holidays. Patient verbalizes understanding and all questions answered.

## 2024-06-03 ENCOUNTER — Inpatient Hospital Stay (HOSPITAL_COMMUNITY)
Admission: AD | Admit: 2024-06-03 | Discharge: 2024-06-03 | Disposition: A | Discharge status: Home / Self Care | Attending: Family Medicine | Admitting: Family Medicine

## 2024-06-03 ENCOUNTER — Inpatient Hospital Stay (HOSPITAL_COMMUNITY)

## 2024-06-03 ENCOUNTER — Other Ambulatory Visit: Payer: Self-pay

## 2024-06-03 DIAGNOSIS — B9689 Other specified bacterial agents as the cause of diseases classified elsewhere: Secondary | ICD-10-CM

## 2024-06-03 DIAGNOSIS — Z3A01 Less than 8 weeks gestation of pregnancy: Secondary | ICD-10-CM | POA: Diagnosis not present

## 2024-06-03 DIAGNOSIS — Z3A00 Weeks of gestation of pregnancy not specified: Secondary | ICD-10-CM | POA: Diagnosis not present

## 2024-06-03 DIAGNOSIS — O209 Hemorrhage in early pregnancy, unspecified: Secondary | ICD-10-CM

## 2024-06-03 LAB — GC/CHLAMYDIA PROBE AMP (~~LOC~~) NOT AT ARMC
Chlamydia: NEGATIVE
Comment: NEGATIVE
Comment: NORMAL
Neisseria Gonorrhea: NEGATIVE

## 2024-06-03 LAB — CBC
HCT: 40.6 % (ref 36.0–46.0)
Hemoglobin: 13.4 g/dL (ref 12.0–15.0)
MCH: 28.9 pg (ref 26.0–34.0)
MCHC: 33 g/dL (ref 30.0–36.0)
MCV: 87.5 fL (ref 80.0–100.0)
Platelets: 314 K/uL (ref 150–400)
RBC: 4.64 MIL/uL (ref 3.87–5.11)
RDW: 13.4 % (ref 11.5–15.5)
WBC: 8.6 K/uL (ref 4.0–10.5)
nRBC: 0 % (ref 0.0–0.2)

## 2024-06-03 LAB — WET PREP, GENITAL
Sperm: NONE SEEN
Trich, Wet Prep: NONE SEEN
WBC, Wet Prep HPF POC: <10 (ref <10)
Yeast Wet Prep HPF POC: NONE SEEN

## 2024-06-03 LAB — POCT PREGNANCY, URINE: Preg Test, Ur: POSITIVE — AB

## 2024-06-03 LAB — HCG, QUANTITATIVE, PREGNANCY: hCG, Beta Chain, Quant, S: 111 m[IU]/mL — ABNORMAL HIGH (ref <5)

## 2024-06-03 LAB — URINALYSIS, ROUTINE W REFLEX MICROSCOPIC
Bilirubin Urine: NEGATIVE
Glucose, UA: NEGATIVE mg/dL
Ketones, ur: NEGATIVE mg/dL
Leukocytes,Ua: NEGATIVE
Nitrite: NEGATIVE
Protein, ur: NEGATIVE mg/dL
Specific Gravity, Urine: 1.016 (ref 1.005–1.030)
pH: 8 (ref 5.0–8.0)

## 2024-06-03 MED ORDER — METRONIDAZOLE 500 MG PO TABS: 500.0000 mg | ORAL_TABLET | Freq: Two times a day (BID) | ORAL | 0 refills | Status: DC

## 2024-06-03 NOTE — MAU Provider Note (Signed)
 History     CSN: [id number]  Arrival date and time: 06/03/24 0848   None     Chief Complaint  Patient presents with   Spotting   Abdominal Pain   HPI Patient is a 33 year old G2, P1 at unknown gestation presenting for vaginal bleeding in early pregnancy.  Reports that she noticed the bleeding yesterday but the spotting got worse today.  She thinks she may be around 8 weeks left LMP in October.  Reports cramping and slight abdominal pain but no other symptoms.  Reports intercourse 2 days ago.  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   IBS (irritable bowel syndrome)    Interstitial cystitis    Major depression    OCD (obsessive compulsive disorder)    Pregnancy    Reactive airway disease    Reflux    Restless legs    URI (upper respiratory infection)    Vaginal Pap smear, abnormal     Past Surgical History:  Procedure Laterality Date   CERVICAL CONIZATION W/BX N/A 09/21/2019   Procedure: COLD KNIFE CONIZATION CERVIX WITH BIOPSY;  Surgeon: Edsel Norleen GAILS, MD;  Location: [facility];  Service: Gynecology;  Laterality: N/A;   NO PAST SURGERIES     OPEN REDUCTION INTERNAL FIXATION (ORIF) DISTAL RADIAL FRACTURE Left 07/16/2023   Procedure: OPEN REDUCTION INTERNAL FIXATION (ORIF) DISTAL RADIUS FRACTURE;  Surgeon: Romona Harari, MD;  [facility];  Service: Orthopedics;  Laterality: Left;  regional 120    Family History  Problem Relation Age of Onset   Cancer Paternal Grandmother    Hypertension Paternal Grandmother    Diabetes Paternal Grandmother    Arthritis Paternal Grandmother    Heart disease Paternal Grandmother    Depression Mother     Bipolar disorder Sister    Drug abuse Paternal Aunt     Social History   Tobacco Use   Smoking status: Every Day    Current packs/day: 0.50    Average packs/day: 0.5 packs/day for 5.0 years (2.5 ttl pk-yrs)    Types: Cigarettes   Smokeless tobacco: Never  Vaping Use   Vaping status: Never Used  Substance Use Topics   Alcohol use: No   Drug use: No    Allergies:  Allergies  Allergen Reactions   Abilify [Aripiprazole]     Restless legs   Celexa [Citalopram Hydrobromide]     Agitation    Clavulanic Acid    Clindamycin/Lincomycin    Zoloft [Sertraline Hcl]     Suicidal thoughts   Amoxil [Amoxicillin] Rash   Penicillins Rash and Other (See Comments)    Unknown Childhood reaction    Medications Prior to Admission  Medication Sig Dispense Refill Last Dose/Taking   benzonatate  (TESSALON ) 100 MG capsule Take 1 capsule (100 mg total) by mouth 3 (three) times daily as needed for cough. Do not take with  alcohol or while operating or driving heavy machinery 21 capsule 0     Review of Systems  Gastrointestinal:  Negative for abdominal pain.  Genitourinary:  Positive for vaginal bleeding.   Physical Exam   Blood pressure 127/61, pulse 75, temperature 98 F (36.7 C), temperature source Oral, height 5' 7 (1.702 m), weight 88.9 kg, last menstrual period 04/14/2024, SpO2 100%.  Physical Exam Vitals and nursing note reviewed.  Constitutional:      Appearance: Normal appearance.  HENT:     Head: Normocephalic and atraumatic.     Nose: No congestion or rhinorrhea.  Eyes:     Extraocular Movements: Extraocular movements intact.  Cardiovascular:     Rate and Rhythm: Normal rate.  Pulmonary:     Effort: Pulmonary effort is normal.  Abdominal:     Palpations: Abdomen is soft.     Tenderness: There is no abdominal tenderness.  Musculoskeletal:        General: Normal range of motion.     Cervical back: Normal range of motion.  Skin:    General: Skin is warm.     Capillary  Refill: Capillary refill takes less than 2 seconds.  Neurological:     General: No focal deficit present.     Mental Status: She is alert.     Cranial Nerves: No cranial nerve deficit.  Psychiatric:        Mood and Affect: Mood normal.        Behavior: Behavior normal.     MAU Course  Procedures  MDM Ultrasound hCG CBC Physical exam  Assessment and Plan  Tracy Flores is a 33 year old G2, P1 presenting for vaginal bleeding in early pregnancy  Vaginal bleeding in early pregnancy Vaginal bleeding which started yesterday.  Reports intercourse 2 days ago.  CBC within normal limits.  hCG quant 111.  Ultrasound showing gestational sac measuring 5 weeks with no fetal pole or cardiac activity.  Wet prep positive for clue cells.  Urinalysis showed a few bacteria.  Discussed results with patient.  Vaginal bleeding could be related to BV infection and recent intercourse.  Could also be early signs of miscarriage.  Concern regarding viability of pregnancy given gestational sac measuring 5 weeks and hCG only 111.  Discussed this with the patient.  She needs a repeat viability scan in 14 days.  Discussed return MAU precautions.  Messaged primary OB clinic who will schedule viability scan.  No further questions or concerns.  Patient discharged home.  Crista Nuon V Jakobee Brackins 06/03/2024, 11:34 AM

## 2024-06-03 NOTE — Discharge Instructions (Signed)
 It was a pleasure taking care of you today.  Your bleeding could be related to your recent intercourse and bacterial vaginosis infection.  There could also be early signs of a miscarriage.  We did an ultrasound today that shows you have a gestational sac in your uterus but there was no fetus seen.  This could be because it is so early in the pregnancy but could also be a failed pregnancy.  You need a repeat ultrasound in 14 days.  If you have worsening bleeding please return for further evaluation.  I hope you have a great rest of your day!

## 2024-06-03 NOTE — MAU Note (Signed)
 Tracy Flores is a 33 y.o. at Unknown here in MAU reporting: she's having light spotting that began yesterday and has continued today.  Reports last intercourse 2-3 days ago.  Also reports constant lower abdominal cramping  LMP: 04/14/2024 Onset of complaint: yesterday Pain score: 3 Vitals:   06/03/24 0858  BP: 127/61  Pulse: 75  Temp: 98 F (36.7 C)  SpO2: 100%     FHT: NA  Lab orders placed from triage: UPT

## 2024-06-07 ENCOUNTER — Other Ambulatory Visit

## 2024-06-15 ENCOUNTER — Other Ambulatory Visit: Payer: Self-pay | Admitting: Obstetrics & Gynecology

## 2024-06-15 DIAGNOSIS — O3680X0 Pregnancy with inconclusive fetal viability, not applicable or unspecified: Secondary | ICD-10-CM

## 2024-06-16 ENCOUNTER — Ambulatory Visit

## 2024-06-16 ENCOUNTER — Encounter: Payer: Self-pay | Admitting: Adult Health

## 2024-06-16 ENCOUNTER — Ambulatory Visit: Admitting: Adult Health

## 2024-06-16 VITALS — BP 119/75 | HR 73 | Ht 67.0 in | Wt 196.5 lb

## 2024-06-16 DIAGNOSIS — Z3A00 Weeks of gestation of pregnancy not specified: Secondary | ICD-10-CM | POA: Diagnosis not present

## 2024-06-16 DIAGNOSIS — Z3202 Encounter for pregnancy test, result negative: Secondary | ICD-10-CM

## 2024-06-16 DIAGNOSIS — O039 Complete or unspecified spontaneous abortion without complication: Secondary | ICD-10-CM | POA: Diagnosis not present

## 2024-06-16 DIAGNOSIS — O209 Hemorrhage in early pregnancy, unspecified: Secondary | ICD-10-CM

## 2024-06-16 LAB — POCT URINE PREGNANCY: Preg Test, Ur: NEGATIVE

## 2024-06-16 NOTE — Progress Notes (Signed)
°  Subjective:     Patient ID: Tracy Flores Ill, female   DOB: [birth year], 33 y.o.   MRN: [id number]  HPI Dacey is a 33 year old white female, married, G1P1001, after being seen in MAU for bleeding 06/03/24, QHCG was 111, US  showed small fluid collection in uterus no YS or embryo seen. Bleeding stopped about 06/08/24. She was here for US , but cancelled that as UPT is negative.     Component Value Date/Time   DIAGPAP  02/08/2021 1211    - Negative for intraepithelial lesion or malignancy (NILM)   DIAGPAP  11/11/2019 1409    - Negative for intraepithelial lesion or malignancy (NILM)   DIAGPAP (A) 07/07/2019 1012    - High grade squamous intraepithelial lesion (HSIL)   HPVHIGH Negative 02/08/2021 1211   HPVHIGH Negative 11/11/2019 1409   HPVHIGH Positive (A) 07/07/2019 1012   ADEQPAP  02/08/2021 1211    Satisfactory for evaluation; transformation zone component PRESENT.   ADEQPAP  11/11/2019 1409    Satisfactory for evaluation; transformation zone component PRESENT.   ADEQPAP  07/07/2019 1012    Satisfactory for evaluation; transformation zone component PRESENT.    PCP is Dr Alphonsa  Review of Systems Bleeding has stopped Reviewed past medical,surgical, social and family history. Reviewed medications and allergies.     Objective:   Physical Exam BP 119/75 (BP Location: Right Arm, Patient Position: Sitting, Cuff Size: Normal)   Pulse 73   Ht 5' 7 (1.702 m)   Wt 196 lb 8 oz (89.1 kg)   LMP 04/14/2024 (Approximate)   BMI 30.78 kg/m     UPT is negative  Skin warm and dry. Lungs: clear to ausculation bilaterally. Cardiovascular: regular rate and rhythm.   Upstream - 06/16/24 1702       Pregnancy Intention Screening   Does the patient want to become pregnant in the next year? Yes    Does the patient's partner want to become pregnant in the next year? Yes    Would the patient like to discuss contraceptive options today? No      Contraception Wrap Up   Current Method  Pregnant/Seeking Pregnancy    End Method Pregnant/Seeking Pregnancy    Contraception Counseling Provided No          Assessment:     1. Bleeding in early pregnancy Bleeding has stopped UPT was negative Check QHCG  - POCT urine pregnancy - Beta hCG quant (ref lab)  2. Urine pregnancy test negative (Primary) UPT negative  Continue PNV  Will message in am [facility] results,     Plan:     Follow up prn

## 2024-06-17 LAB — BETA HCG QUANT (REF LAB): hCG Quant: <1 m[IU]/mL

## 2024-07-06 ENCOUNTER — Other Ambulatory Visit: Payer: Self-pay | Admitting: Adult Health

## 2024-07-06 DIAGNOSIS — Z3201 Encounter for pregnancy test, result positive: Secondary | ICD-10-CM

## 2024-07-06 NOTE — Progress Notes (Signed)
Ck QHCG and progesterone level. 

## 2024-07-07 ENCOUNTER — Ambulatory Visit: Payer: Self-pay | Admitting: Adult Health

## 2024-07-07 DIAGNOSIS — Z3201 Encounter for pregnancy test, result positive: Secondary | ICD-10-CM

## 2024-07-07 LAB — PROGESTERONE: Progesterone: 25.8 ng/mL

## 2024-07-07 LAB — BETA HCG QUANT (REF LAB): hCG Quant: 126 m[IU]/mL

## 2024-07-09 LAB — BETA HCG QUANT (REF LAB): hCG Quant: 292 m[IU]/mL

## 2024-07-20 ENCOUNTER — Other Ambulatory Visit: Payer: Self-pay | Admitting: Obstetrics & Gynecology

## 2024-07-20 ENCOUNTER — Other Ambulatory Visit: Payer: Self-pay | Admitting: Nurse Practitioner

## 2024-07-20 ENCOUNTER — Ambulatory Visit: Admitting: Nurse Practitioner

## 2024-07-20 VITALS — BP 112/78 | HR 85 | Temp 98.4°F | Ht 67.0 in | Wt 205.5 lb

## 2024-07-20 DIAGNOSIS — F419 Anxiety disorder, unspecified: Secondary | ICD-10-CM | POA: Diagnosis not present

## 2024-07-20 DIAGNOSIS — O09529 Supervision of elderly multigravida, unspecified trimester: Secondary | ICD-10-CM

## 2024-07-20 DIAGNOSIS — F32A Depression, unspecified: Secondary | ICD-10-CM

## 2024-07-20 DIAGNOSIS — Z3201 Encounter for pregnancy test, result positive: Secondary | ICD-10-CM | POA: Diagnosis not present

## 2024-07-20 DIAGNOSIS — O3680X0 Pregnancy with inconclusive fetal viability, not applicable or unspecified: Secondary | ICD-10-CM

## 2024-07-20 NOTE — Progress Notes (Unsigned)
" ° °  Subjective:    Patient ID: Tracy Flores Ill, female    DOB: 01/15/1991, 34 y.o.   MRN: [id number]  HPI Patient is here for a 3 month follow up Stated no concerns    Review of Systems     Objective:   Physical Exam        Assessment & Plan:    "

## 2024-07-21 ENCOUNTER — Encounter: Payer: Self-pay | Admitting: Nurse Practitioner

## 2024-07-21 ENCOUNTER — Ambulatory Visit: Payer: Self-pay | Admitting: Nurse Practitioner

## 2024-07-21 ENCOUNTER — Telehealth: Payer: Self-pay | Admitting: *Deleted

## 2024-07-21 DIAGNOSIS — G43701 Chronic migraine without aura, not intractable, with status migrainosus: Secondary | ICD-10-CM

## 2024-07-21 LAB — BETA HCG QUANT (REF LAB): hCG Quant: 15132 m[IU]/mL

## 2024-07-21 NOTE — Progress Notes (Signed)
 Complex Care Management Note  Care Guide Note 07/21/2024 Name: MYLEI BRACKEEN MRN: [id number] DOB: 07-Jul-1990  Mariel JONETTA Lenart is a 34 y.o. year old female who sees Luking, Glendia LABOR, MD for primary care. I reached out to Mariel JONETTA Ill by phone today to offer complex care management services.  Ms. Bolduc was given information about Complex Care Management services today including:   The Complex Care Management services include support from the care team which includes your Nurse Care Manager, Clinical Social Worker, or Pharmacist.  The Complex Care Management team is here to help remove barriers to the health concerns and goals most important to you. Complex Care Management services are voluntary, and the patient may decline or stop services at any time by request to their care team member.   Complex Care Management Consent Status: Patient agreed to services and verbal consent obtained.   Follow up plan:  Telephone appointment with complex care management team member scheduled for:  08/05/24  Encounter Outcome:  Patient Scheduled  Harlene Satterfield  [facility]  [facility], [facility] Guide  Direct Dial: [phone]  Fax [phone]

## 2024-07-23 ENCOUNTER — Encounter: Payer: Self-pay | Admitting: *Deleted

## 2024-07-26 ENCOUNTER — Encounter: Payer: Self-pay | Admitting: *Deleted

## 2024-07-26 ENCOUNTER — Other Ambulatory Visit: Admitting: Radiology

## 2024-07-29 ENCOUNTER — Other Ambulatory Visit: Payer: Self-pay | Admitting: Obstetrics & Gynecology

## 2024-07-29 ENCOUNTER — Other Ambulatory Visit

## 2024-07-29 DIAGNOSIS — O3680X0 Pregnancy with inconclusive fetal viability, not applicable or unspecified: Secondary | ICD-10-CM

## 2024-07-29 DIAGNOSIS — O09529 Supervision of elderly multigravida, unspecified trimester: Secondary | ICD-10-CM

## 2024-07-29 NOTE — Progress Notes (Signed)
 US  7+1 wks,single IUP with yolk sac,CRL 10.3 mm,FHR 123 bpm,normal left ovary,simple right corpus luteal cyst 3.3 x 2.3 x 2.8 cm

## 2024-08-05 ENCOUNTER — Other Ambulatory Visit: Payer: Self-pay | Admitting: *Deleted

## 2024-08-05 DIAGNOSIS — Z558 Other problems related to education and literacy: Secondary | ICD-10-CM

## 2024-08-05 NOTE — Patient Outreach (Signed)
 Complex Care Management   Visit Note  08/05/2024  Name:  Tracy Flores MRN: [id number] DOB: [birth year]  Situation: Referral received for Complex Care Management related to Health Plan Referral I obtained verbal consent from Patient.  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   IBS (irritable bowel syndrome)    Interstitial cystitis    Major depression    OCD (obsessive compulsive disorder)    Pregnancy    Reactive airway disease    Reflux    Restless legs    URI (upper respiratory infection)    Vaginal Pap smear, abnormal     Assessment: Patient Reported Symptoms:  Cognitive Cognitive Status: No symptoms reported Cognitive/Intellectual Conditions Management [RPT]: None reported or documented in medical history or problem list   Health Maintenance Behaviors: Annual physical exam Healing Pattern: Average Health Facilitated by: Rest  Neurological Neurological Review of Symptoms: No symptoms reported Neurological Management Strategies: Routine screening Neurological Self-Management Outcome: 5 (very good)  HEENT HEENT Symptoms Reported: No symptoms reported HEENT Management Strategies: Routine screening HEENT Self-Management Outcome: 5 (very good)    Cardiovascular Cardiovascular Symptoms Reported: Fatigue Does patient have uncontrolled Hypertension?: No Cardiovascular Management Strategies: Routine screening Cardiovascular Self-Management Outcome: 5 (very good)  Respiratory Respiratory Symptoms Reported: No symptoms reported Respiratory Management Strategies: Routine screening Respiratory Self-Management Outcome: 5 (very good)  Endocrine Endocrine Symptoms Reported: No symptoms reported Is patient diabetic?: No Endocrine Self-Management Outcome: 5 (very good)  Gastrointestinal  Gastrointestinal Symptoms Reported: Nausea Gastrointestinal Self-Management Outcome: 4 (good)    Genitourinary Genitourinary Symptoms Reported: No symptoms reported Genitourinary Self-Management Outcome: 5 (very good)  Integumentary Integumentary Symptoms Reported: No symptoms reported Skin Self-Management Outcome: 5 (very good)  Musculoskeletal Musculoskelatal Symptoms Reviewed: No symptoms reported Musculoskeletal Management Strategies: Routine screening Musculoskeletal Self-Management Outcome: 5 (very good) Falls in the past year?: No Number of falls in past year: 1 or less Was there an injury with Fall?: No Fall Risk Category Calculator: 0 Patient Fall Risk Level: Low Fall Risk Patient at Risk for Falls Due to: No Fall Risks Fall risk Follow up: Falls evaluation completed  Psychosocial Psychosocial Symptoms Reported: No symptoms reported Behavioral Management Strategies: Coping strategies Behavioral Health Self-Management Outcome: 5 (very good) Major Change/Loss/Stressor/Fears (CP): Denies Techniques to Cope with Loss/Stress/Change: None Quality of Family Relationships: helpful Do you feel physically threatened by others?: No    08/05/2024    PHQ2-9 Depression Screening   Little interest or pleasure in doing things Not at all  Feeling down, depressed, or hopeless Not at all  PHQ-2 - Total Score 0  Trouble falling or staying asleep, or sleeping too much    Feeling tired or having little energy    Poor appetite or overeating     Feeling bad about yourself - or that you are a failure or have let yourself or your family down    Trouble concentrating on things, such as reading the newspaper or watching television    Moving or speaking so slowly that other people could have noticed.  Or the opposite - being so fidgety or restless that you have been moving around a lot more than usual    Thoughts that you would be better off dead, or hurting yourself in some way    PHQ2-9  Total Score     If you checked off any problems, how difficult have these problems made it for you to do your work, take care of things at home, or get along with other people    Depression Interventions/Treatment      There were no vitals filed for this visit. Pain Scale: 0-10 Pain Score: 0-No pain  Medications Reviewed Today     Reviewed by Bertrum Rosina HERO, RN (Registered Nurse) on 08/05/24 at 1343  Med List Status: <None>   Medication Order Taking? Sig Documenting Provider Last Dose Status Informant           No Medications to Display                            Recommendation:   Continue Current Plan of Care  Follow Up Plan:   Referral to Care Guide Closing From:  Complex Care Management - RNCM  Rosina Bertrum, BSN RN [facility], [facility] RN Care Manager Direct Dial: [phone]  Fax: [phone]

## 2024-08-05 NOTE — Patient Instructions (Signed)
 Visit Information  Tracy Flores was given information about Medicaid Managed Care team care coordination services as a part of their [employer] Medicaid benefit. Tracy Flores   If you would like to schedule transportation through your [facility] plan, please call the following number at least 2 days in advance of your appointment: [phone]  For information about your ride after you set it up, call Ride Assist at [phone]. Use this number to activate a Will Call pickup, or if your transportation is late for a scheduled pickup. Use this number, too, if you need to make a change or cancel a previously scheduled reservation.  If you need transportation services right away, call [phone]. The after-hours call center is staffed 24 hours to handle ride assistance and urgent reservation requests (including discharges) 365 days a year. Urgent trips include sick visits, hospital discharge requests and life-sustaining treatment.  Call the [facility] Line at [phone], at any time, 24 hours a day, 7 days a week. If you are in danger or need immediate medical attention call 911.   Please see education materials related to Pregnancy - First Trimester provided by MyChart link.  Patient verbalizes understanding of instructions and care plan provided today and agrees to view in MyChart. Active MyChart status and patient understanding of how to access instructions and care plan via MyChart confirmed with patient.     No further follow up required: Declined to enroll  Rosina Forte, BSN RN [facility], [facility] RN Care Manager Direct Dial: [phone]  Fax: [phone]

## 2024-09-01 ENCOUNTER — Encounter: Admitting: *Deleted

## 2024-09-01 ENCOUNTER — Encounter: Admitting: Women's Health
